# Patient Record
Sex: Female | Born: 1965 | Race: White | Hispanic: No | Marital: Single | Smoking: Current every day smoker
Health system: Southern US, Community
[De-identification: ages and names within clinical notes are randomized; demographics above are authoritative.]

## PROBLEM LIST (undated history)

## (undated) DIAGNOSIS — F419 Anxiety disorder, unspecified: Secondary | ICD-10-CM

## (undated) DIAGNOSIS — Z923 Personal history of irradiation: Secondary | ICD-10-CM

## (undated) DIAGNOSIS — J449 Chronic obstructive pulmonary disease, unspecified: Secondary | ICD-10-CM

## (undated) DIAGNOSIS — R1310 Dysphagia, unspecified: Secondary | ICD-10-CM

## (undated) DIAGNOSIS — Z973 Presence of spectacles and contact lenses: Secondary | ICD-10-CM

## (undated) DIAGNOSIS — C801 Malignant (primary) neoplasm, unspecified: Secondary | ICD-10-CM

## (undated) DIAGNOSIS — J069 Acute upper respiratory infection, unspecified: Secondary | ICD-10-CM

## (undated) DIAGNOSIS — K219 Gastro-esophageal reflux disease without esophagitis: Secondary | ICD-10-CM

## (undated) DIAGNOSIS — Z87442 Personal history of urinary calculi: Secondary | ICD-10-CM

## (undated) DIAGNOSIS — Z1211 Encounter for screening for malignant neoplasm of colon: Secondary | ICD-10-CM

## (undated) DIAGNOSIS — M1990 Unspecified osteoarthritis, unspecified site: Secondary | ICD-10-CM

## (undated) DIAGNOSIS — T7840XA Allergy, unspecified, initial encounter: Secondary | ICD-10-CM

## (undated) DIAGNOSIS — L409 Psoriasis, unspecified: Secondary | ICD-10-CM

## (undated) DIAGNOSIS — C50919 Malignant neoplasm of unspecified site of unspecified female breast: Secondary | ICD-10-CM

## (undated) DIAGNOSIS — J45909 Unspecified asthma, uncomplicated: Secondary | ICD-10-CM

## (undated) HISTORY — PX: BREAST BIOPSY: SHX20

## (undated) HISTORY — DX: Encounter for screening for malignant neoplasm of colon: Z12.11

## (undated) HISTORY — DX: Unspecified asthma, uncomplicated: J45.909

## (undated) HISTORY — DX: Allergy, unspecified, initial encounter: T78.40XA

## (undated) HISTORY — DX: Acute upper respiratory infection, unspecified: J06.9

## (undated) HISTORY — PX: URETHRA SURGERY: SHX824

## (undated) HISTORY — DX: Gastro-esophageal reflux disease without esophagitis: K21.9

## (undated) HISTORY — DX: Psoriasis, unspecified: L40.9

## (undated) HISTORY — DX: Dysphagia, unspecified: R13.10

---

## 2000-07-12 ENCOUNTER — Other Ambulatory Visit: Admission: RE | Admit: 2000-07-12 | Discharge: 2000-07-12 | Payer: Self-pay | Admitting: Obstetrics & Gynecology

## 2002-09-12 ENCOUNTER — Other Ambulatory Visit: Admission: RE | Admit: 2002-09-12 | Discharge: 2002-09-12 | Payer: Self-pay | Admitting: Obstetrics & Gynecology

## 2003-01-07 ENCOUNTER — Other Ambulatory Visit: Admission: RE | Admit: 2003-01-07 | Discharge: 2003-01-07 | Payer: Self-pay | Admitting: Obstetrics & Gynecology

## 2003-07-01 ENCOUNTER — Other Ambulatory Visit: Admission: RE | Admit: 2003-07-01 | Discharge: 2003-07-01 | Payer: Self-pay | Admitting: Obstetrics & Gynecology

## 2003-09-30 ENCOUNTER — Other Ambulatory Visit: Admission: RE | Admit: 2003-09-30 | Discharge: 2003-09-30 | Payer: Self-pay | Admitting: Obstetrics & Gynecology

## 2004-03-23 ENCOUNTER — Other Ambulatory Visit: Admission: RE | Admit: 2004-03-23 | Discharge: 2004-03-23 | Payer: Self-pay | Admitting: Obstetrics & Gynecology

## 2004-12-21 ENCOUNTER — Ambulatory Visit: Payer: Self-pay | Admitting: Internal Medicine

## 2004-12-22 ENCOUNTER — Ambulatory Visit: Payer: Self-pay | Admitting: Internal Medicine

## 2004-12-23 ENCOUNTER — Ambulatory Visit: Payer: Self-pay | Admitting: Internal Medicine

## 2004-12-26 ENCOUNTER — Ambulatory Visit: Payer: Self-pay | Admitting: Internal Medicine

## 2005-02-15 ENCOUNTER — Other Ambulatory Visit: Admission: RE | Admit: 2005-02-15 | Discharge: 2005-02-15 | Payer: Self-pay | Admitting: Obstetrics & Gynecology

## 2005-03-24 ENCOUNTER — Ambulatory Visit: Payer: Self-pay | Admitting: Gastroenterology

## 2006-03-27 ENCOUNTER — Encounter: Admission: RE | Admit: 2006-03-27 | Discharge: 2006-03-27 | Payer: Self-pay | Admitting: Obstetrics & Gynecology

## 2007-04-17 ENCOUNTER — Encounter: Admission: RE | Admit: 2007-04-17 | Discharge: 2007-04-17 | Payer: Self-pay | Admitting: Obstetrics & Gynecology

## 2007-11-23 ENCOUNTER — Emergency Department: Payer: Self-pay | Admitting: Emergency Medicine

## 2008-05-06 ENCOUNTER — Ambulatory Visit: Payer: Self-pay | Admitting: Family Medicine

## 2012-10-16 ENCOUNTER — Other Ambulatory Visit: Payer: Self-pay | Admitting: Obstetrics & Gynecology

## 2012-10-16 ENCOUNTER — Ambulatory Visit
Admission: RE | Admit: 2012-10-16 | Discharge: 2012-10-16 | Disposition: A | Discharge status: Home / Self Care | Payer: Managed Care, Other (non HMO) | Source: Ambulatory Visit | Attending: Obstetrics & Gynecology | Admitting: Obstetrics & Gynecology

## 2012-10-16 DIAGNOSIS — R042 Hemoptysis: Secondary | ICD-10-CM

## 2013-12-30 HISTORY — PX: LAPAROSCOPIC ASSISTED VAGINAL HYSTERECTOMY: SHX5398

## 2013-12-30 HISTORY — PX: BILATERAL SALPINGOOPHORECTOMY: SHX1223

## 2014-11-11 ENCOUNTER — Other Ambulatory Visit: Payer: Self-pay | Admitting: Obstetrics & Gynecology

## 2014-11-11 DIAGNOSIS — N6002 Solitary cyst of left breast: Secondary | ICD-10-CM

## 2014-11-13 ENCOUNTER — Ambulatory Visit
Admission: RE | Admit: 2014-11-13 | Discharge: 2014-11-13 | Disposition: A | Discharge status: Home / Self Care | Payer: Managed Care, Other (non HMO) | Source: Ambulatory Visit | Attending: Obstetrics & Gynecology | Admitting: Obstetrics & Gynecology

## 2014-11-13 DIAGNOSIS — N6002 Solitary cyst of left breast: Secondary | ICD-10-CM

## 2016-06-29 ENCOUNTER — Other Ambulatory Visit: Payer: Self-pay

## 2016-06-29 ENCOUNTER — Encounter: Payer: Self-pay | Admitting: Gastroenterology

## 2016-06-29 ENCOUNTER — Ambulatory Visit (INDEPENDENT_AMBULATORY_CARE_PROVIDER_SITE_OTHER): Payer: Managed Care, Other (non HMO) | Admitting: Gastroenterology

## 2016-06-29 VITALS — BP 131/85 | HR 86 | Temp 98.6°F | Ht 61.0 in | Wt 156.0 lb

## 2016-06-29 DIAGNOSIS — K219 Gastro-esophageal reflux disease without esophagitis: Secondary | ICD-10-CM | POA: Diagnosis not present

## 2016-06-29 DIAGNOSIS — R1310 Dysphagia, unspecified: Secondary | ICD-10-CM

## 2016-06-29 NOTE — Progress Notes (Signed)
Gastroenterology Consultation  Referring Provider:     No ref. provider found Primary Care Physician:  No primary care provider on file. Primary Gastroenterologist:  Dr. Allen Norris     Reason for Consultation:     Dysphagia        HPI:   Denise Reyes is a 50 y.o. y/o female referred for consultation & management of Dysphagia by Dr. No primary care provider on file..  This patient comes in today with a history of dysphagia. She also reports that she has chronic heartburn. The patient recently started on over-the-counter Prevacid and states it has not started working it. The patient states that her symptoms are mostly when she lays down at night but approximate 1 month ago she had a bowl of soup that she felt like her stomach was on fire. The patient also reports that when she eats in the morning she feels like food has a hard time going down and sometimes feels like is getting stuck. The patient also reports that she has never had a colonoscopy in the past. There is no report of any family history of colon cancer colon polyps. There is no report of any unexplained weight loss and the patient states that she has actually gained weight recently. He thinks the weight gain may be contributing to her symptoms.  Past Medical History:  Diagnosis Date  . Allergy   . Asthma   . GERD (gastroesophageal reflux disease)     History reviewed. No pertinent surgical history.  Prior to Admission medications   Medication Sig Start Date End Date Taking? Authorizing Provider  cetirizine (ZYRTEC) 10 MG tablet TK 1 T PO D FOR ALLERGIES 05/18/16  Yes Historical Provider, MD  lansoprazole (PREVACID) 30 MG capsule Take 30 mg by mouth daily at 12 noon.   Yes Historical Provider, MD  PREMARIN 1.25 MG tablet  05/31/16  Yes Historical Provider, MD    Family History  Problem Relation Age of Onset  . Hypertension Mother   . Hypertension Father   . Heart disease Father   . Heart disease Paternal Grandfather       Social History  Substance Use Topics  . Smoking status: Never Smoker  . Smokeless tobacco: Never Used  . Alcohol use No    Allergies as of 06/29/2016  . (Not on File)    Review of Systems:    All systems reviewed and negative except where noted in HPI.   Physical Exam:  BP 131/85   Pulse 86   Temp 98.6 F (37 C) (Oral)   Ht 5\' 1"  (1.549 m)   Wt 156 lb (70.8 kg)   BMI 29.48 kg/m  No LMP recorded. Patient is postmenopausal. Psych:  Alert and cooperative. Normal mood and affect. General:   Alert,  Well-developed, well-nourished, pleasant and cooperative in NAD Head:  Normocephalic and atraumatic. Eyes:  Sclera clear, no icterus.   Conjunctiva pink. Ears:  Normal auditory acuity. Nose:  No deformity, discharge, or lesions. Mouth:  No deformity or lesions,oropharynx pink & moist. Neck:  Supple; no masses or thyromegaly. Lungs:  Respirations even and unlabored.  Clear throughout to auscultation.   No wheezes, crackles, or rhonchi. No acute distress. Heart:  Regular rate and rhythm; no murmurs, clicks, rubs, or gallops. Abdomen:  Normal bowel sounds.  No bruits.  Soft, non-tender and non-distended without masses, hepatosplenomegaly or hernias noted.  No guarding or rebound tenderness.  Negative Carnett sign.   Rectal:  Deferred.  Msk:  Symmetrical without gross deformities.  Good, equal movement & strength bilaterally. Pulses:  Normal pulses noted. Extremities:  No clubbing or edema.  No cyanosis. Neurologic:  Alert and oriented x3;  grossly normal neurologically. Skin:  Intact without significant lesions or rashes.  No jaundice. Lymph Nodes:  No significant cervical adenopathy. Psych:  Alert and cooperative. Normal mood and affect.  Imaging Studies: No results found.  Assessment and Plan:   Denise Reyes is a 50 y.o. y/o female who comes in today with a history of dysphagia. She also has GERD-like symptoms that are worse when she lays down at night. The patient will be  switched to a trial of Dexilant. The patient will also be set up for an EGD and colonoscopy. The colonoscopy will be done for screening purposes in her EGD will be done because of her dysphagia and chronic reflux. I have discussed risks & benefits which include, but are not limited to, bleeding, infection, perforation & drug reaction.  The patient agrees with this plan & written consent will be obtained.       Lucilla Lame, MD. Marval Regal   Note: This dictation was prepared with Dragon dictation along with smaller phrase technology. Any transcriptional errors that result from this process are unintentional.

## 2016-07-03 ENCOUNTER — Encounter: Payer: Self-pay | Admitting: *Deleted

## 2016-07-05 ENCOUNTER — Other Ambulatory Visit: Payer: Self-pay

## 2016-07-05 MED ORDER — PEG 3350-KCL-NABCB-NACL-NASULF 236 G PO SOLR: ORAL | 0 refills | Status: DC

## 2016-07-06 NOTE — Discharge Instructions (Signed)

## 2016-07-07 ENCOUNTER — Ambulatory Visit
Admission: RE | Admit: 2016-07-07 | Discharge: 2016-07-07 | Disposition: A | Discharge status: Home / Self Care | Payer: Managed Care, Other (non HMO) | Source: Ambulatory Visit | Attending: Gastroenterology | Admitting: Gastroenterology

## 2016-07-07 ENCOUNTER — Ambulatory Visit: Payer: Managed Care, Other (non HMO) | Admitting: Anesthesiology

## 2016-07-07 ENCOUNTER — Encounter
Admission: RE | Disposition: A | Discharge status: Home / Self Care | Payer: Self-pay | Source: Ambulatory Visit | Attending: Gastroenterology

## 2016-07-07 DIAGNOSIS — Z79899 Other long term (current) drug therapy: Secondary | ICD-10-CM | POA: Insufficient documentation

## 2016-07-07 DIAGNOSIS — M19031 Primary osteoarthritis, right wrist: Secondary | ICD-10-CM | POA: Diagnosis not present

## 2016-07-07 DIAGNOSIS — M19032 Primary osteoarthritis, left wrist: Secondary | ICD-10-CM | POA: Diagnosis not present

## 2016-07-07 DIAGNOSIS — K219 Gastro-esophageal reflux disease without esophagitis: Secondary | ICD-10-CM | POA: Insufficient documentation

## 2016-07-07 DIAGNOSIS — Z90710 Acquired absence of both cervix and uterus: Secondary | ICD-10-CM | POA: Insufficient documentation

## 2016-07-07 DIAGNOSIS — Z1211 Encounter for screening for malignant neoplasm of colon: Secondary | ICD-10-CM | POA: Insufficient documentation

## 2016-07-07 DIAGNOSIS — K641 Second degree hemorrhoids: Secondary | ICD-10-CM | POA: Diagnosis not present

## 2016-07-07 DIAGNOSIS — F17210 Nicotine dependence, cigarettes, uncomplicated: Secondary | ICD-10-CM | POA: Insufficient documentation

## 2016-07-07 DIAGNOSIS — J45909 Unspecified asthma, uncomplicated: Secondary | ICD-10-CM | POA: Diagnosis not present

## 2016-07-07 DIAGNOSIS — M170 Bilateral primary osteoarthritis of knee: Secondary | ICD-10-CM | POA: Diagnosis not present

## 2016-07-07 DIAGNOSIS — R1310 Dysphagia, unspecified: Secondary | ICD-10-CM | POA: Insufficient documentation

## 2016-07-07 HISTORY — DX: Unspecified osteoarthritis, unspecified site: M19.90

## 2016-07-07 HISTORY — PX: ESOPHAGOGASTRODUODENOSCOPY (EGD) WITH PROPOFOL: SHX5813

## 2016-07-07 HISTORY — PX: COLONOSCOPY WITH PROPOFOL: SHX5780

## 2016-07-07 HISTORY — DX: Presence of spectacles and contact lenses: Z97.3

## 2016-07-07 SURGERY — COLONOSCOPY WITH PROPOFOL
Anesthesia: Monitor Anesthesia Care | Wound class: Contaminated

## 2016-07-07 MED ORDER — ACETAMINOPHEN 160 MG/5ML PO SOLN: 325.0000 mg | ORAL | Status: DC | PRN

## 2016-07-07 MED ORDER — STERILE WATER FOR IRRIGATION IR SOLN
Status: DC | PRN
  Administered 2016-07-07: 10:00:00

## 2016-07-07 MED ORDER — ACETAMINOPHEN 325 MG PO TABS: 325.0000 mg | ORAL_TABLET | ORAL | Status: DC | PRN

## 2016-07-07 MED ORDER — LACTATED RINGERS IV SOLN: INTRAVENOUS | Status: DC

## 2016-07-07 MED ORDER — PROPOFOL 10 MG/ML IV BOLUS
INTRAVENOUS | Status: DC | PRN
  Administered 2016-07-07 (×6): 50 mg via INTRAVENOUS
  Administered 2016-07-07: 100 mg via INTRAVENOUS

## 2016-07-07 MED ORDER — GLYCOPYRROLATE 0.2 MG/ML IJ SOLN
INTRAMUSCULAR | Status: DC | PRN
Start: 2016-07-07 — End: 2016-07-07
  Administered 2016-07-07: 0.2 mg via INTRAVENOUS

## 2016-07-07 MED ORDER — LIDOCAINE HCL (CARDIAC) 20 MG/ML IV SOLN
INTRAVENOUS | Status: DC | PRN
  Administered 2016-07-07: 50 mg via INTRAVENOUS

## 2016-07-07 MED ORDER — LACTATED RINGERS IV SOLN
INTRAVENOUS | Status: DC | PRN
  Administered 2016-07-07: 09:00:00 via INTRAVENOUS

## 2016-07-07 SURGICAL SUPPLY — 35 items

## 2016-07-07 NOTE — Op Note (Signed)
[facility] Gastroenterology Patient Name: Denise Reyes Procedure Date: 07/07/2016 10:07 AM MRN: [id number] Account #: [id number] Date of Birth: 1966-03-12 Admit Type: Outpatient Age: 50 Room: [facility] OR ROOM 01 Gender: Female Note Status: Finalized Procedure:            Colonoscopy Indications:          Screening for colorectal malignant neoplasm Providers:            Lucilla Lame MD, MD Medicines:            Propofol per Anesthesia Complications:        No immediate complications. Procedure:            Pre-Anesthesia Assessment:                       - Prior to the procedure, a History and Physical was                        performed, and patient medications and allergies were                        reviewed. The patient's tolerance of previous                        anesthesia was also reviewed. The risks and benefits of                        the procedure and the sedation options and risks were                        discussed with the patient. All questions were                        answered, and informed consent was obtained. Prior                        Anticoagulants: The patient has taken no previous                        anticoagulant or antiplatelet agents. ASA Grade                        Assessment: II - A patient with mild systemic disease.                        After reviewing the risks and benefits, the patient was                        deemed in satisfactory condition to undergo the                        procedure.                       After obtaining informed consent, the colonoscope was                        passed under direct vision. Throughout the procedure,                        the patient's blood pressure,  pulse, and oxygen                        saturations were monitored continuously. The was                        introduced through the anus and advanced to the the                        cecum, identified by appendiceal orifice  and ileocecal                        valve. The colonoscopy was performed without                        difficulty. The patient tolerated the procedure well.                        The quality of the bowel preparation was excellent. Findings:      The perianal and digital rectal examinations were normal.      Non-bleeding internal hemorrhoids were found during retroflexion. The       hemorrhoids were Grade II (internal hemorrhoids that prolapse but reduce       spontaneously). Impression:           - Non-bleeding internal hemorrhoids.                       - No specimens collected. Recommendation:       - Discharge patient to home.                       - Resume previous diet.                       - Continue present medications.                       - Repeat colonoscopy in 10 years for screening unless                        any change in family history or lower GI problems. Procedure Code(s):    --- Professional ---                       [phone], Colonoscopy, flexible; diagnostic, including                        collection of specimen(s) by brushing or washing, when                        performed (separate procedure) Diagnosis Code(s):    --- Professional ---                       Z12.11, Encounter for screening for malignant neoplasm                        of colon CPT copyright 2016 American Medical Association. All rights reserved. The codes documented in this report are preliminary and upon coder review may  be revised to meet current compliance requirements. Lucilla Lame MD, MD 07/07/2016 10:34:03 AM This report has been signed electronically.  Number of Addenda: 0 Note Initiated On: 07/07/2016 10:07 AM Scope Withdrawal Time: 0 hours 7 minutes 5 seconds  Total Procedure Duration: 0 hours 10 minutes 15 seconds       [facility]

## 2016-07-07 NOTE — Op Note (Signed)
[facility] Gastroenterology Patient Name: Denise Reyes Procedure Date: 07/07/2016 10:07 AM MRN: [id number] Account #: [id number] Date of Birth: May 07, 1966 Admit Type: Outpatient Age: 51 Room: [facility] OR ROOM 01 Gender: Female Note Status: Finalized Procedure:            Upper GI endoscopy Indications:          Dysphagia Providers:            Lucilla Lame MD, MD Medicines:            Propofol per Anesthesia Complications:        No immediate complications. Procedure:            Pre-Anesthesia Assessment:                       - Prior to the procedure, a History and Physical was                        performed, and patient medications and allergies were                        reviewed. The patient's tolerance of previous                        anesthesia was also reviewed. The risks and benefits of                        the procedure and the sedation options and risks were                        discussed with the patient. All questions were                        answered, and informed consent was obtained. Prior                        Anticoagulants: The patient has taken no previous                        anticoagulant or antiplatelet agents. ASA Grade                        Assessment: II - A patient with mild systemic disease.                        After reviewing the risks and benefits, the patient was                        deemed in satisfactory condition to undergo the                        procedure.                       After obtaining informed consent, the endoscope was                        passed under direct vision. Throughout the procedure,                        the patient's blood pressure, pulse, and  oxygen                        saturations were monitored continuously. The was                        introduced through the mouth, and advanced to the                        second part of duodenum. The upper GI endoscopy was    accomplished without difficulty. The patient tolerated                        the procedure well. Findings:      The examined esophagus was normal. Two biopsies were obtained in the       middle third of the esophagus with cold forceps for histology.      The stomach was normal.      The examined duodenum was normal. Impression:           - Normal esophagus.                       - Normal stomach.                       - Normal examined duodenum.                       - Two biopsies were obtained in the middle third of the                        esophagus. Recommendation:       - Discharge patient to home.                       - Resume previous diet.                       - Continue present medications.                       - Await pathology results.                       - Perform a colonoscopy. Procedure Code(s):    --- Professional ---                       [phone], Esophagogastroduodenoscopy, flexible, transoral;                        with biopsy, single or multiple Diagnosis Code(s):    --- Professional ---                       R13.10, Dysphagia, unspecified CPT copyright 2016 American Medical Association. All rights reserved. The codes documented in this report are preliminary and upon coder review may  be revised to meet current compliance requirements. Lucilla Lame MD, MD 07/07/2016 10:19:34 AM This report has been signed electronically. Number of Addenda: 0 Note Initiated On: 07/07/2016 10:07 AM Total Procedure Duration: 0 hours 2 minutes 39 seconds       [facility]

## 2016-07-07 NOTE — H&P (Signed)
  Lucilla Lame, MD [facility] [address] [place], [place] [address] Phone: [phone] Fax : [phone]  Primary Care Physician:  No primary care provider on file. Primary Gastroenterologist:  Dr. Allen Norris  Pre-Procedure History & Physical: HPI:  Denise Reyes is a 50 y.o. female is here for an endoscopy and colonoscopy.   Past Medical History:  Diagnosis Date  . Allergy   . Arthritis    knees, wrists  . Asthma   . GERD (gastroesophageal reflux disease)   . Wears contact lenses     Past Surgical History:  Procedure Laterality Date  . ABDOMINAL HYSTERECTOMY      Prior to Admission medications   Medication Sig Start Date End Date Taking? Authorizing Provider  cetirizine (ZYRTEC) 10 MG tablet TK 1 T PO D FOR ALLERGIES 05/18/16  Yes Historical Provider, MD  Dexlansoprazole (DEXILANT PO) Take by mouth daily.   Yes Historical Provider, MD  PREMARIN 1.25 MG tablet  05/31/16  Yes Historical Provider, MD  lansoprazole (PREVACID) 30 MG capsule Take 30 mg by mouth daily at 12 noon.    Historical Provider, MD  polyethylene glycol (GOLYTELY) 236 g solution Drink one 8 oz glass every 20 mins until stools are clear 07/05/16   Lucilla Lame, MD    Allergies as of 06/29/2016  . (Not on File)    Family History  Problem Relation Age of Onset  . Hypertension Mother   . Hypertension Father   . Heart disease Father   . Heart disease Paternal Grandfather     Social History   Social History  . Marital status: Married    Spouse name: N/A  . Number of children: N/A  . Years of education: N/A   Occupational History  . Not on file.   Social History Main Topics  . Smoking status: Current Every Day Smoker    Packs/day: 1.00    Years: 35.00    Types: Cigarettes  . Smokeless tobacco: Never Used  . Alcohol use 1.2 oz/week    2 Cans of beer per week  . Drug use: No  . Sexual activity: Not on file   Other Topics Concern  . Not on file   Social History Narrative  . No narrative  on file    Review of Systems: See HPI, otherwise negative ROS  Physical Exam: BP (!) 120/96   Pulse 96   Temp 97.6 F (36.4 C) (Temporal)   Ht 4\' 11"  (1.499 m)   Wt 152 lb (68.9 kg)   SpO2 97%   BMI 30.70 kg/m  General:   Alert,  pleasant and cooperative in NAD Head:  Normocephalic and atraumatic. Neck:  Supple; no masses or thyromegaly. Lungs:  Clear throughout to auscultation.    Heart:  Regular rate and rhythm. Abdomen:  Soft, nontender and nondistended. Normal bowel sounds, without guarding, and without rebound.   Neurologic:  Alert and  oriented x4;  grossly normal neurologically.  Impression/Plan: Denise Reyes is here for an endoscopy and colonoscopy to be performed for dysphagia screening  Risks, benefits, limitations, and alternatives regarding  endoscopy and colonoscopy have been reviewed with the patient.  Questions have been answered.  All parties agreeable.   Lucilla Lame, MD  07/07/2016, 9:42 AM

## 2016-07-07 NOTE — Anesthesia Postprocedure Evaluation (Signed)
Anesthesia Post Note  Patient: Denise Reyes  Procedure(s) Performed: Procedure(s) (LRB): COLONOSCOPY WITH PROPOFOL (N/A) ESOPHAGOGASTRODUODENOSCOPY (EGD) WITH PROPOFOL (N/A)  Patient location during evaluation: PACU Anesthesia Type: MAC Level of consciousness: awake and alert and oriented Pain management: satisfactory to patient Vital Signs Assessment: post-procedure vital signs reviewed and stable Respiratory status: spontaneous breathing, nonlabored ventilation and respiratory function stable Cardiovascular status: blood pressure returned to baseline and stable Postop Assessment: Adequate PO intake and No signs of nausea or vomiting Anesthetic complications: no    Raliegh Ip

## 2016-07-07 NOTE — Anesthesia Procedure Notes (Signed)
Procedure Name: MAC Performed by: Rorie Delmore Pre-anesthesia Checklist: Patient identified, Emergency Drugs available, Suction available, Timeout performed and Patient being monitored Patient Re-evaluated:Patient Re-evaluated prior to inductionOxygen Delivery Method: Nasal cannula Placement Confirmation: positive ETCO2     

## 2016-07-07 NOTE — Anesthesia Preprocedure Evaluation (Signed)
Anesthesia Evaluation  Patient identified by MRN, date of birth, ID band Patient awake    Reviewed: Allergy & Precautions, H&P , NPO status , Patient's Chart, lab work & pertinent test results  Airway Mallampati: II  TM Distance: >3 FB Neck ROM: full    Dental no notable dental hx.    Pulmonary asthma , Current Smoker,    Pulmonary exam normal        Cardiovascular Normal cardiovascular exam     Neuro/Psych    GI/Hepatic GERD  ,  Endo/Other    Renal/GU      Musculoskeletal   Abdominal   Peds  Hematology   Anesthesia Other Findings   Reproductive/Obstetrics                             Anesthesia Physical Anesthesia Plan  ASA: II  Anesthesia Plan: MAC   Post-op Pain Management:    Induction:   Airway Management Planned:   Additional Equipment:   Intra-op Plan:   Post-operative Plan:   Informed Consent: I have reviewed the patients History and Physical, chart, labs and discussed the procedure including the risks, benefits and alternatives for the proposed anesthesia with the patient or authorized representative who has indicated his/her understanding and acceptance.     Plan Discussed with:   Anesthesia Plan Comments:         Anesthesia Quick Evaluation

## 2016-07-07 NOTE — Transfer of Care (Signed)
Immediate Anesthesia Transfer of Care Note  Patient: Denise Reyes  Procedure(s) Performed: Procedure(s): COLONOSCOPY WITH PROPOFOL (N/A) ESOPHAGOGASTRODUODENOSCOPY (EGD) WITH PROPOFOL (N/A)  Patient Location: PACU  Anesthesia Type: MAC  Level of Consciousness: awake, alert  and patient cooperative  Airway and Oxygen Therapy: Patient Spontanous Breathing and Patient connected to supplemental oxygen  Post-op Assessment: Post-op Vital signs reviewed, Patient's Cardiovascular Status Stable, Respiratory Function Stable, Patent Airway and No signs of Nausea or vomiting  Post-op Vital Signs: Reviewed and stable  Complications: No apparent anesthesia complications

## 2016-07-10 ENCOUNTER — Encounter: Payer: Self-pay | Admitting: Gastroenterology

## 2016-07-13 ENCOUNTER — Telehealth: Payer: Self-pay

## 2016-07-13 ENCOUNTER — Other Ambulatory Visit: Payer: Self-pay

## 2016-07-13 MED ORDER — DEXLANSOPRAZOLE 60 MG PO CPDR: 60.0000 mg | DELAYED_RELEASE_CAPSULE | Freq: Every day | ORAL | 6 refills | Status: DC

## 2016-07-13 NOTE — Telephone Encounter (Signed)
-----   Message from Lucilla Lame, MD sent at 07/11/2016  7:41 AM EST ----- Let the patient know that the pathology of the esophagus showed some reactive tissue which may be from reflux but no sign of any cause for her dysphagia. If she continues to have dysphagia please let me now.

## 2016-07-13 NOTE — Telephone Encounter (Signed)
Pt has been notified of EGD results. Pt has requested a rx for Dexilant as she was given samples at her last ov and it is working well. Advised her if she starts feeling symptoms of dysphagia to give me a call per Dr. Dorothey Baseman recommendations.

## 2017-01-25 ENCOUNTER — Ambulatory Visit
Admission: RE | Admit: 2017-01-25 | Discharge: 2017-01-25 | Disposition: A | Discharge status: Home / Self Care | Payer: Managed Care, Other (non HMO) | Source: Ambulatory Visit | Attending: Family Medicine | Admitting: Family Medicine

## 2017-01-25 ENCOUNTER — Other Ambulatory Visit: Payer: Self-pay | Admitting: Family Medicine

## 2017-01-25 DIAGNOSIS — R05 Cough: Secondary | ICD-10-CM

## 2017-01-25 DIAGNOSIS — R059 Cough, unspecified: Secondary | ICD-10-CM

## 2017-07-31 DIAGNOSIS — Z923 Personal history of irradiation: Secondary | ICD-10-CM

## 2017-07-31 HISTORY — DX: Personal history of irradiation: Z92.3

## 2017-12-07 ENCOUNTER — Other Ambulatory Visit: Payer: Self-pay | Admitting: Obstetrics & Gynecology

## 2017-12-07 DIAGNOSIS — N6320 Unspecified lump in the left breast, unspecified quadrant: Secondary | ICD-10-CM

## 2017-12-11 ENCOUNTER — Other Ambulatory Visit: Payer: Self-pay | Admitting: Obstetrics & Gynecology

## 2017-12-11 ENCOUNTER — Ambulatory Visit
Admission: RE | Admit: 2017-12-11 | Discharge: 2017-12-11 | Disposition: A | Discharge status: Home / Self Care | Payer: Managed Care, Other (non HMO) | Source: Ambulatory Visit | Attending: Obstetrics & Gynecology | Admitting: Obstetrics & Gynecology

## 2017-12-11 DIAGNOSIS — N6320 Unspecified lump in the left breast, unspecified quadrant: Secondary | ICD-10-CM

## 2017-12-11 DIAGNOSIS — R921 Mammographic calcification found on diagnostic imaging of breast: Secondary | ICD-10-CM

## 2017-12-26 ENCOUNTER — Ambulatory Visit
Admission: RE | Admit: 2017-12-26 | Discharge: 2017-12-26 | Disposition: A | Discharge status: Home / Self Care | Payer: Managed Care, Other (non HMO) | Source: Ambulatory Visit | Attending: Obstetrics & Gynecology | Admitting: Obstetrics & Gynecology

## 2017-12-26 ENCOUNTER — Other Ambulatory Visit: Payer: Self-pay | Admitting: Obstetrics & Gynecology

## 2017-12-26 DIAGNOSIS — R921 Mammographic calcification found on diagnostic imaging of breast: Secondary | ICD-10-CM

## 2017-12-29 HISTORY — PX: BREAST LUMPECTOMY: SHX2

## 2018-01-09 ENCOUNTER — Encounter: Payer: Self-pay | Admitting: Surgery

## 2018-01-09 ENCOUNTER — Ambulatory Visit (INDEPENDENT_AMBULATORY_CARE_PROVIDER_SITE_OTHER): Payer: Managed Care, Other (non HMO) | Admitting: Surgery

## 2018-01-09 VITALS — BP 119/80 | HR 92 | Temp 97.8°F | Wt 136.0 lb

## 2018-01-09 DIAGNOSIS — D0512 Intraductal carcinoma in situ of left breast: Secondary | ICD-10-CM

## 2018-01-09 NOTE — Patient Instructions (Signed)
We have spoken today about removing a lump in your breast. This will be done on 01/23/2018 by Dr. Burt Knack at [facility].  You will most likely be able to leave the hospital several hours after your surgery. Rarely, a patient needs to stay over night but this is a possibility.  Plan to tenatively be off work for 1-2 weeks following the surgery and may return with approximately 4 more weeks of a lifting restriction, no greater than 15 lbs.    Lumpectomy A lumpectomy is a form of "breast conserving" or "breast preservation" surgery. It may also be referred to as a partial mastectomy. During a lumpectomy, the portion of the breast that contains the cancerous tumor or breast mass (the lump) is removed. Some normal tissue around the lump may also be removed to make sure all of the tumor has been removed.  LET [facility] CARE PROVIDER KNOW ABOUT:  Any allergies you have.  All medicines you are taking, including vitamins, herbs, eye drops, creams, and over-the-counter medicines.  Previous problems you or members of your family have had with the use of anesthetics.  Any blood disorders you have.  Previous surgeries you have had.  Medical conditions you have. RISKS AND COMPLICATIONS Generally, this is a safe procedure. However, problems can occur and include:  Bleeding.  Infection.  Pain.  Temporary swelling.  Change in the shape of the breast, particularly if a large portion is removed. BEFORE THE PROCEDURE  Ask your health care provider about changing or stopping your regular medicines. This is especially important if you are taking diabetes medicines or blood thinners.  Do not eat or drink anything after midnight on the night before the procedure or as directed by your health care provider. Ask your health care provider if you can take a sip of water with any approved medicines.  On the day of surgery, your health care provider will use a mammogram or ultrasound to locate and mark the  tumor in your breast. These markings on your breast will show where the cut (incision) will be made. PROCEDURE   An IV tube will be put into one of your veins.  You may be given medicine to help you relax before the surgery (sedative). You will be given one of the following:  A medicine that numbs the area (local anesthetic).  A medicine that makes you fall asleep (general anesthetic).  Your health care provider will use a kind of electric scalpel that uses heat to minimize bleeding (electrocautery knife).  A curved incision (like a smile or frown) that follows the natural curve of your breast is made, to allow for minimal scarring and better healing.  The tumor will be removed with some of the surrounding tissue. This will be sent to the lab for analysis. Your health care provider may also remove your lymph nodes at this time if needed.  Sometimes, but not always, a rubber tube called a drain will be surgically inserted into your breast area or armpit to collect excess fluid that may accumulate in the space where the tumor was. This drain is connected to a plastic bulb on the outside of your body. This drain creates suction to help remove the fluid.  The incisions will be closed with stitches (sutures).  A bandage may be placed over the incisions. AFTER THE PROCEDURE  You will be taken to the recovery area.  You will be given medicine for pain.  A small rubber drain may be placed in the  breast for 2-3 days to prevent a collection of blood (hematoma) from developing in the breast. You will be given instructions on caring for the drain before you go home.  A pressure bandage (dressing) will be applied for 1-2 days to prevent bleeding. Ask your health care provider how to care for your bandage at home.   This information is not intended to replace advice given to you by your health care provider. Make sure you discuss any questions you have with your health care provider.   Document  Released: 08/28/2006 Document Revised: 08/07/2014 Document Reviewed: 12/20/2012 Elsevier Interactive Patient Education [employer].

## 2018-01-09 NOTE — Progress Notes (Signed)
Denise Reyes is an 52 y.o. female.   Consult requested by Dr. 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  On mammography it measured 7 mm of pleomorphic calcifications.  Patient states that her primary care physician found this on palpation but the patient never felt it herself.  She states that she does occasional but not regular self exams.  She has no other symptoms.  Her father's mother had breast cancer in her 69s.  Patient smokes tobacco but also takes Premarin.  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  Past Medical History:  Diagnosis Date  . Allergy   . Arthritis    knees, wrists  . Asthma   . GERD (gastroesophageal reflux disease)   . Problems with swallowing and mastication   . Special screening for malignant neoplasms, colon   . Wears contact lenses     Past Surgical History:  Procedure Laterality Date  . ABDOMINAL HYSTERECTOMY    . COLONOSCOPY WITH PROPOFOL N/A 07/07/2016   Procedure: COLONOSCOPY WITH PROPOFOL;  Surgeon: Lucilla Lame, MD;  Location: [facility];  Service: Endoscopy;  Laterality: N/A;  . ESOPHAGOGASTRODUODENOSCOPY (EGD) WITH PROPOFOL N/A 07/07/2016   Procedure: ESOPHAGOGASTRODUODENOSCOPY (EGD) WITH PROPOFOL;  Surgeon: Lucilla Lame, MD;  Location: [facility];  Service: Endoscopy;  Laterality: N/A;    Family History  Problem Relation Age of Onset  . Hypertension Mother   . Hypertension Father   . Heart disease Father   . Heart disease Paternal Grandfather    Social History:  reports that she has been smoking cigarettes.  She has a 35.00 pack-year smoking history. She has never used smokeless tobacco. She reports that she drinks about 1.2 oz of alcohol per week. She reports that she does not use drugs.  Allergies: No Known Allergies   (Not in a hospital admission)   Review of Systems:   Review of Systems  Constitutional: Negative.   HENT: Negative.    Eyes: Negative.   Respiratory: Negative.   Cardiovascular: Negative.   Gastrointestinal: Negative.   Genitourinary: Negative.   Musculoskeletal: Negative.   Skin: Negative.   Neurological: Negative.   Endo/Heme/Allergies: Negative.   Psychiatric/Behavioral: Negative.     Physical Exam:  Physical Exam  Constitutional: She is oriented to person, place, and time. She appears well-developed and well-nourished.  HENT:  Head: Normocephalic and atraumatic.  Eyes: Pupils are equal, round, and reactive to light. Right eye exhibits no discharge. Left eye exhibits no discharge. No scleral icterus.  Neck: Normal range of motion. Neck supple.  Cardiovascular: Normal rate and regular rhythm.  Pulmonary/Chest: Effort normal and breath sounds normal.  Abdominal: Soft. She exhibits no distension.  Musculoskeletal: She exhibits no edema or deformity.  Neurological: She is alert and oriented to person, place, and time.  Skin: Skin is warm. No rash noted. No erythema.  Psychiatric: She has a normal mood and affect. Her behavior is normal.  Vitals reviewed.  Breast exam: No mass in either breast no axillary adenopathy.  In the left 4 o'clock position is an area of induration without erythema and a small wound suggestive of entry point for the core needle biopsy.  No drainage. There were no vitals taken for this visit.    No results found for this or any previous visit (from the past 48 hour(s)). No results found.   Assessment/Plan Mammogram and ultrasound are reviewed. Mammogram and ultrasound size this area  of pleomorphic calcifications at approximately 7 mm with a clinically negative axilla and ultrasound graphically negative axilla.  Patient warrants excision of the left mammographic abnormality consisting of DCIS.  No sentinel node biopsy is warranted in this patient with a low risk lesion and small size on mammography with a clinically negative axilla with ultrasound negative  axilla.  Multiple questions were answered for she and her sister.  Her sister notified us that she is ovarian cancer patient and has undergone bracket testing and other genetic testing all negative.  There is no history of colon cancer in their family. Approximately 45 minutes were spent with the family.   Florene Glen, MD, FACS

## 2018-01-10 ENCOUNTER — Other Ambulatory Visit: Payer: Self-pay | Admitting: Surgery

## 2018-01-10 DIAGNOSIS — D0512 Intraductal carcinoma in situ of left breast: Secondary | ICD-10-CM

## 2018-01-11 ENCOUNTER — Telehealth: Payer: Self-pay | Admitting: Surgery

## 2018-01-11 NOTE — Telephone Encounter (Signed)
Pt advised of pre op date/time and sx date. Sx: 01/23/18 with Dr Beryle Beams breast bx with NL/partial mastectomy.  Pre op: 01/17/18 between 9-1:00pm--phone interview.   Patient made aware to arrive at [facility] center at 7:45am-the morning of surgery.   Patient understands all directions.

## 2018-01-17 ENCOUNTER — Encounter
Admission: RE | Admit: 2018-01-17 | Discharge: 2018-01-17 | Disposition: A | Discharge status: Home / Self Care | Payer: Managed Care, Other (non HMO) | Source: Ambulatory Visit | Attending: Surgery | Admitting: Surgery

## 2018-01-17 ENCOUNTER — Telehealth: Payer: Self-pay | Admitting: Surgery

## 2018-01-17 ENCOUNTER — Other Ambulatory Visit: Payer: Self-pay

## 2018-01-17 HISTORY — DX: Malignant (primary) neoplasm, unspecified: C80.1

## 2018-01-17 HISTORY — DX: Personal history of urinary calculi: Z87.442

## 2018-01-17 HISTORY — DX: Anxiety disorder, unspecified: F41.9

## 2018-01-17 NOTE — Patient Instructions (Signed)
Your procedure is scheduled on: 01-23-18 [facility] Report to [place] @ 7:45 AM Remember: Instructions that are not followed completely may result in serious medical risk, up to and including death, or upon the discretion of your surgeon and anesthesiologist your surgery may need to be rescheduled.    _x___ 1. Do not eat food after midnight the night before your procedure. NO GUM OR CANDY AFTER MIDNIGHT.  You may drink clear liquids up to 2 hours before you are scheduled to arrive at the hospital for your procedure.  Do not drink clear liquids within 2 hours of your scheduled arrival to the hospital.  Clear liquids include  --Water or Apple juice without pulp  --Clear carbohydrate beverage such as ClearFast or Gatorade  --Black Coffee or Clear Tea (No milk, no creamers, do not add anything to the coffee or Tea   __x__ 2. No Alcohol for 24 hours before or after surgery.   __x__3. No Smoking or e-cigarettes for 24 prior to surgery.  Do not use any chewable tobacco products for at least 6 hour prior to surgery   ____  4. Bring all medications with you on the day of surgery if instructed.    __x__ 5. Notify your doctor if there is any change in your medical condition     (cold, fever, infections).    x___6. On the morning of surgery brush your teeth with toothpaste and water.  You may rinse your mouth with mouth wash if you wish.  Do not swallow any toothpaste or mouthwash.   Do not wear jewelry, make-up, hairpins, clips or nail polish.  Do not wear lotions, powders, or perfumes. You may wear deodorant.  Do not shave 48 hours prior to surgery. Men may shave face and neck.  Do not bring valuables to the hospital.    [facility] is not responsible for any belongings or valuables.               Contacts, dentures or bridgework may not be worn into surgery.  Leave your suitcase in the car. After surgery it may be brought to your room.  For patients admitted to the hospital,  discharge time is determined by your treatment team.  _  Patients discharged the day of surgery will not be allowed to drive home.  You will need someone to drive you home and stay with you the night of your procedure.    Please read over the following fact sheets that you were given:   [facility] Preparing for Surgery  _x___ TAKE THE FOLLOWING MEDICATION THE MORNING OF SURGERY WITH A SMALL SIP OF WATER. These include:  1. ZYRTEC-D  2. YOU MAY TAKE YOUR XANAX (ALPRAZOLAM) IF NEEDED DAY OF SURGERY   3.  4.  5.  6.  ____Fleets enema or Magnesium Citrate as directed.   _x___ Use CHG Soap or sage wipes as directed on instruction sheet   _X___ Use inhalers on the day of surgery and bring to hospital day of surgery-USE YOUR ALBUTEROL INHALER DAY OF SURGERY AND [place]  ____ Stop Metformin and Janumet 2 days prior to surgery.    ____ Take 1/2 of usual insulin dose the night before surgery and none on the morning surgery.   ____ Follow recommendations from Cardiologist, Pulmonologist or PCP regarding stopping Aspirin, Coumadin, Plavix ,Eliquis, Effient, or Pradaxa, and Pletal.  X____Stop Anti-inflammatories such as Advil, Aleve, Ibuprofen, Motrin, Naproxen, Naprosyn, Goodies powders or aspirin products  NOW-OK to take Tylenol    ____ Stop supplements until after surgery.     ____ Bring C-Pap to the hospital.

## 2018-01-17 NOTE — Telephone Encounter (Signed)
Called [employer] back and asked for Denise Reyes, however, I was able to speak to Denise Reyes. He had specific questions about patient's health. Denise Reyes requested to know what was patient's diagnosis, surgery type and date and if she had any restrictions. Denise Reyes had no further questions.

## 2018-01-17 NOTE — Telephone Encounter (Signed)
A lady named Denise Reyes is calling from patient's disability is calling to get some information on the patient's breast cancer. Number she can be reached at is [phone] and the claim number is [id number]. Please call and advise.

## 2018-01-21 ENCOUNTER — Encounter
Admission: RE | Admit: 2018-01-21 | Discharge: 2018-01-21 | Disposition: A | Discharge status: Home / Self Care | Payer: Managed Care, Other (non HMO) | Source: Ambulatory Visit | Attending: Surgery | Admitting: Surgery

## 2018-01-21 DIAGNOSIS — D0512 Intraductal carcinoma in situ of left breast: Secondary | ICD-10-CM | POA: Diagnosis present

## 2018-01-21 DIAGNOSIS — M170 Bilateral primary osteoarthritis of knee: Secondary | ICD-10-CM | POA: Diagnosis not present

## 2018-01-21 DIAGNOSIS — M19031 Primary osteoarthritis, right wrist: Secondary | ICD-10-CM | POA: Diagnosis not present

## 2018-01-21 DIAGNOSIS — J45909 Unspecified asthma, uncomplicated: Secondary | ICD-10-CM | POA: Diagnosis not present

## 2018-01-21 DIAGNOSIS — Z79899 Other long term (current) drug therapy: Secondary | ICD-10-CM | POA: Diagnosis not present

## 2018-01-21 DIAGNOSIS — Z803 Family history of malignant neoplasm of breast: Secondary | ICD-10-CM | POA: Diagnosis not present

## 2018-01-21 DIAGNOSIS — K219 Gastro-esophageal reflux disease without esophagitis: Secondary | ICD-10-CM | POA: Diagnosis not present

## 2018-01-21 DIAGNOSIS — M19032 Primary osteoarthritis, left wrist: Secondary | ICD-10-CM | POA: Diagnosis not present

## 2018-01-21 DIAGNOSIS — Z8249 Family history of ischemic heart disease and other diseases of the circulatory system: Secondary | ICD-10-CM | POA: Diagnosis not present

## 2018-01-21 DIAGNOSIS — F419 Anxiety disorder, unspecified: Secondary | ICD-10-CM | POA: Diagnosis not present

## 2018-01-21 DIAGNOSIS — F17210 Nicotine dependence, cigarettes, uncomplicated: Secondary | ICD-10-CM | POA: Diagnosis not present

## 2018-01-21 LAB — CBC WITH DIFFERENTIAL/PLATELET
BASOS ABS: 0 10*3/uL (ref 0–0.1)
Basophils Relative: 0 %
EOS PCT: 1 %
Eosinophils Absolute: 0.1 10*3/uL (ref 0–0.7)
HEMATOCRIT: 41.3 % (ref 35.0–47.0)
Hemoglobin: 14.4 g/dL (ref 12.0–16.0)
LYMPHS ABS: 4.8 10*3/uL — AB (ref 1.0–3.6)
Lymphocytes Relative: 46 %
MCH: 32.8 pg (ref 26.0–34.0)
MCHC: 34.8 g/dL (ref 32.0–36.0)
MCV: 94.2 fL (ref 80.0–100.0)
MONO ABS: 0.7 10*3/uL (ref 0.2–0.9)
MONOS PCT: 7 %
NEUTROS ABS: 4.8 10*3/uL (ref 1.4–6.5)
Neutrophils Relative %: 46 %
PLATELETS: 372 10*3/uL (ref 150–440)
RBC: 4.38 MIL/uL (ref 3.80–5.20)
RDW: 12.9 % (ref 11.5–14.5)
WBC: 10.4 10*3/uL (ref 3.6–11.0)

## 2018-01-21 LAB — BASIC METABOLIC PANEL
ANION GAP: 9 (ref 5–15)
BUN: 13 mg/dL (ref 6–20)
CO2: 25 mmol/L (ref 22–32)
Calcium: 8.8 mg/dL — ABNORMAL LOW (ref 8.9–10.3)
Chloride: 102 mmol/L (ref 101–111)
Creatinine, Ser: 0.64 mg/dL (ref 0.44–1.00)
GFR calc Af Amer: >60 mL/min (ref >60)
GFR calc non Af Amer: >60 mL/min (ref >60)
Glucose, Bld: 83 mg/dL (ref 65–99)
Potassium: 3.4 mmol/L — ABNORMAL LOW (ref 3.5–5.1)
Sodium: 136 mmol/L (ref 135–145)

## 2018-01-22 ENCOUNTER — Inpatient Hospital Stay: Admission: RE | Admit: 2018-01-22 | Payer: Managed Care, Other (non HMO) | Source: Ambulatory Visit

## 2018-01-23 ENCOUNTER — Ambulatory Visit
Admission: RE | Admit: 2018-01-23 | Discharge: 2018-01-23 | Disposition: A | Discharge status: Home / Self Care | Payer: Managed Care, Other (non HMO) | Source: Ambulatory Visit | Attending: Surgery | Admitting: Surgery

## 2018-01-23 ENCOUNTER — Encounter
Admission: RE | Disposition: A | Discharge status: Home / Self Care | Payer: Self-pay | Source: Ambulatory Visit | Attending: Surgery

## 2018-01-23 ENCOUNTER — Ambulatory Visit: Payer: Managed Care, Other (non HMO) | Admitting: Registered Nurse

## 2018-01-23 DIAGNOSIS — D0512 Intraductal carcinoma in situ of left breast: Secondary | ICD-10-CM

## 2018-01-23 DIAGNOSIS — M19031 Primary osteoarthritis, right wrist: Secondary | ICD-10-CM | POA: Insufficient documentation

## 2018-01-23 DIAGNOSIS — Z8249 Family history of ischemic heart disease and other diseases of the circulatory system: Secondary | ICD-10-CM | POA: Insufficient documentation

## 2018-01-23 DIAGNOSIS — M170 Bilateral primary osteoarthritis of knee: Secondary | ICD-10-CM | POA: Insufficient documentation

## 2018-01-23 DIAGNOSIS — Z79899 Other long term (current) drug therapy: Secondary | ICD-10-CM | POA: Insufficient documentation

## 2018-01-23 DIAGNOSIS — Z803 Family history of malignant neoplasm of breast: Secondary | ICD-10-CM | POA: Insufficient documentation

## 2018-01-23 DIAGNOSIS — M19032 Primary osteoarthritis, left wrist: Secondary | ICD-10-CM | POA: Insufficient documentation

## 2018-01-23 DIAGNOSIS — F419 Anxiety disorder, unspecified: Secondary | ICD-10-CM | POA: Insufficient documentation

## 2018-01-23 DIAGNOSIS — K219 Gastro-esophageal reflux disease without esophagitis: Secondary | ICD-10-CM | POA: Insufficient documentation

## 2018-01-23 DIAGNOSIS — F17210 Nicotine dependence, cigarettes, uncomplicated: Secondary | ICD-10-CM | POA: Insufficient documentation

## 2018-01-23 DIAGNOSIS — J45909 Unspecified asthma, uncomplicated: Secondary | ICD-10-CM | POA: Insufficient documentation

## 2018-01-23 HISTORY — PX: BREAST BIOPSY: SHX20

## 2018-01-23 HISTORY — PX: BREAST EXCISIONAL BIOPSY: SUR124

## 2018-01-23 SURGERY — BREAST BIOPSY WITH NEEDLE LOCALIZATION
Anesthesia: General | Site: Breast | Laterality: Left | Wound class: Clean

## 2018-01-23 MED ORDER — FAMOTIDINE 20 MG PO TABS
ORAL_TABLET | ORAL | Status: AC
  Administered 2018-01-23: 20 mg via ORAL
  Filled 2018-01-23: qty 1

## 2018-01-23 MED ORDER — LACTATED RINGERS IV SOLN
INTRAVENOUS | Status: DC
  Administered 2018-01-23: 09:00:00 via INTRAVENOUS

## 2018-01-23 MED ORDER — FAMOTIDINE 20 MG PO TABS
20.0000 mg | ORAL_TABLET | Freq: Once | ORAL | Status: AC
  Administered 2018-01-23: 20 mg via ORAL

## 2018-01-23 MED ORDER — FENTANYL CITRATE (PF) 100 MCG/2ML IJ SOLN: 25.0000 ug | INTRAMUSCULAR | Status: DC | PRN

## 2018-01-23 MED ORDER — DEXAMETHASONE SODIUM PHOSPHATE 10 MG/ML IJ SOLN
INTRAMUSCULAR | Status: AC
  Filled 2018-01-23: qty 1

## 2018-01-23 MED ORDER — PROPOFOL 10 MG/ML IV BOLUS
INTRAVENOUS | Status: DC | PRN
  Administered 2018-01-23: 200 mg via INTRAVENOUS

## 2018-01-23 MED ORDER — FENTANYL CITRATE (PF) 100 MCG/2ML IJ SOLN
INTRAMUSCULAR | Status: DC | PRN
  Administered 2018-01-23 (×4): 25 ug via INTRAVENOUS

## 2018-01-23 MED ORDER — KETOROLAC TROMETHAMINE 30 MG/ML IJ SOLN
INTRAMUSCULAR | Status: DC | PRN
  Administered 2018-01-23: 30 mg via INTRAVENOUS

## 2018-01-23 MED ORDER — MIDAZOLAM HCL 2 MG/2ML IJ SOLN
INTRAMUSCULAR | Status: AC
  Filled 2018-01-23: qty 2

## 2018-01-23 MED ORDER — CEFAZOLIN SODIUM-DEXTROSE 2-4 GM/100ML-% IV SOLN
2.0000 g | INTRAVENOUS | Status: AC
  Administered 2018-01-23: 2 g via INTRAVENOUS

## 2018-01-23 MED ORDER — LIDOCAINE HCL (PF) 2 % IJ SOLN
INTRAMUSCULAR | Status: AC
  Filled 2018-01-23: qty 10

## 2018-01-23 MED ORDER — SUCCINYLCHOLINE CHLORIDE 20 MG/ML IJ SOLN
INTRAMUSCULAR | Status: AC
  Filled 2018-01-23: qty 1

## 2018-01-23 MED ORDER — ONDANSETRON HCL 4 MG/2ML IJ SOLN
INTRAMUSCULAR | Status: DC | PRN
  Administered 2018-01-23: 4 mg via INTRAVENOUS

## 2018-01-23 MED ORDER — HEPARIN SODIUM (PORCINE) 5000 UNIT/ML IJ SOLN: 5000.0000 [IU] | Freq: Once | INTRAMUSCULAR | Status: DC

## 2018-01-23 MED ORDER — FENTANYL CITRATE (PF) 100 MCG/2ML IJ SOLN
INTRAMUSCULAR | Status: AC
  Filled 2018-01-23: qty 2

## 2018-01-23 MED ORDER — CHLORHEXIDINE GLUCONATE CLOTH 2 % EX PADS: 6.0000 | MEDICATED_PAD | Freq: Once | CUTANEOUS | Status: DC

## 2018-01-23 MED ORDER — HEPARIN SODIUM (PORCINE) 5000 UNIT/ML IJ SOLN
INTRAMUSCULAR | Status: AC
  Filled 2018-01-23: qty 1

## 2018-01-23 MED ORDER — DEXAMETHASONE SODIUM PHOSPHATE 10 MG/ML IJ SOLN
INTRAMUSCULAR | Status: DC | PRN
  Administered 2018-01-23: 4 mg via INTRAVENOUS

## 2018-01-23 MED ORDER — BUPIVACAINE-EPINEPHRINE 0.5% -1:200000 IJ SOLN
INTRAMUSCULAR | Status: DC | PRN
  Administered 2018-01-23: 30 mL

## 2018-01-23 MED ORDER — KETOROLAC TROMETHAMINE 30 MG/ML IJ SOLN
INTRAMUSCULAR | Status: AC
  Filled 2018-01-23: qty 1

## 2018-01-23 MED ORDER — LACTATED RINGERS IV SOLN
INTRAVENOUS | Status: DC | PRN
  Administered 2018-01-23: 11:00:00 via INTRAVENOUS

## 2018-01-23 MED ORDER — ONDANSETRON HCL 4 MG/2ML IJ SOLN: 4.0000 mg | Freq: Once | INTRAMUSCULAR | Status: DC | PRN

## 2018-01-23 MED ORDER — ONDANSETRON HCL 4 MG/2ML IJ SOLN
INTRAMUSCULAR | Status: AC
  Filled 2018-01-23: qty 2

## 2018-01-23 MED ORDER — PROPOFOL 10 MG/ML IV BOLUS
INTRAVENOUS | Status: AC
  Filled 2018-01-23: qty 20

## 2018-01-23 MED ORDER — LIDOCAINE HCL (CARDIAC) PF 100 MG/5ML IV SOSY
PREFILLED_SYRINGE | INTRAVENOUS | Status: DC | PRN
  Administered 2018-01-23: 100 mg via INTRAVENOUS

## 2018-01-23 MED ORDER — CEFAZOLIN SODIUM-DEXTROSE 2-4 GM/100ML-% IV SOLN
INTRAVENOUS | Status: AC
  Filled 2018-01-23: qty 100

## 2018-01-23 MED ORDER — HYDROCODONE-ACETAMINOPHEN 5-300 MG PO TABS: 1.0000 | ORAL_TABLET | ORAL | 0 refills | Status: DC | PRN

## 2018-01-23 MED ORDER — MIDAZOLAM HCL 2 MG/2ML IJ SOLN
INTRAMUSCULAR | Status: DC | PRN
  Administered 2018-01-23: 2 mg via INTRAVENOUS

## 2018-01-23 SURGICAL SUPPLY — 52 items
ADH LQ OCL WTPRF AMP STRL LF (MISCELLANEOUS) ×1
ADH SKN CLS APL DERMABOND .7 (GAUZE/BANDAGES/DRESSINGS)
ADHESIVE MASTISOL STRL (MISCELLANEOUS) ×2 IMPLANT
BINDER BREAST LRG (GAUZE/BANDAGES/DRESSINGS) IMPLANT
BINDER BREAST MEDIUM (GAUZE/BANDAGES/DRESSINGS) IMPLANT
BLADE PHOTON ILLUMINATED (MISCELLANEOUS) ×3 IMPLANT
BLADE SURG 15 STRL LF DISP TIS (BLADE) ×1 IMPLANT
BLADE SURG 15 STRL SS (BLADE) ×3
CANISTER SUCT 1200ML W/VALVE (MISCELLANEOUS) ×3 IMPLANT
CHLORAPREP W/TINT 26ML (MISCELLANEOUS) ×3 IMPLANT
CLIP VESOCCLUDE SM WIDE 6/CT (CLIP) ×1 IMPLANT
CLOSURE WOUND 1/2 X4 (GAUZE/BANDAGES/DRESSINGS) ×1
CNTNR SPEC 2.5X3XGRAD LEK (MISCELLANEOUS)
CONT SPEC 4OZ STER OR WHT (MISCELLANEOUS)
CONT SPEC 4OZ STRL OR WHT (MISCELLANEOUS)
CONTAINER SPEC 2.5X3XGRAD LEK (MISCELLANEOUS) ×2 IMPLANT
COVER PROBE FLX POLY STRL (MISCELLANEOUS) ×1 IMPLANT
DERMABOND ADVANCED (GAUZE/BANDAGES/DRESSINGS)
DERMABOND ADVANCED .7 DNX12 (GAUZE/BANDAGES/DRESSINGS) ×2 IMPLANT
DEVICE DUBIN SPECIMEN MAMMOGRA (MISCELLANEOUS) ×3 IMPLANT
DRAPE LAPAROTOMY 100X77 ABD (DRAPES) ×3 IMPLANT
DRSG GAUZE FLUFF 36X18 (GAUZE/BANDAGES/DRESSINGS) ×5 IMPLANT
ELECT CAUTERY BLADE 6.4 (BLADE) ×3 IMPLANT
ELECT REM PT RETURN 9FT ADLT (ELECTROSURGICAL) ×3
ELECTRODE REM PT RTRN 9FT ADLT (ELECTROSURGICAL) ×1 IMPLANT
GLOVE SURG SYN 7.0 (GLOVE) ×3 IMPLANT
GLOVE SURG SYN 7.0 PF PI (GLOVE) ×1 IMPLANT
GLOVE SURG SYN 7.5  E (GLOVE) ×2
GLOVE SURG SYN 7.5 E (GLOVE) ×1 IMPLANT
GLOVE SURG SYN 7.5 PF PI (GLOVE) ×1 IMPLANT
GOWN STRL REUS W/ TWL LRG LVL3 (GOWN DISPOSABLE) ×1 IMPLANT
GOWN STRL REUS W/TWL LRG LVL3 (GOWN DISPOSABLE) ×3
KIT TURNOVER KIT A (KITS) ×3 IMPLANT
LABEL OR SOLS (LABEL) ×3 IMPLANT
NDL FILTER BLUNT 18X1 1/2 (NEEDLE) ×1 IMPLANT
NDL HYPO 25X1 1.5 SAFETY (NEEDLE) ×1 IMPLANT
NEEDLE FILTER BLUNT 18X 1/2SAF (NEEDLE) ×2
NEEDLE FILTER BLUNT 18X1 1/2 (NEEDLE) ×1 IMPLANT
NEEDLE HYPO 22GX1.5 SAFETY (NEEDLE) ×3 IMPLANT
NEEDLE HYPO 25X1 1.5 SAFETY (NEEDLE) ×3 IMPLANT
PACK BASIN MINOR ARMC (MISCELLANEOUS) ×3 IMPLANT
SLEVE PROBE SENORX GAMMA FIND (MISCELLANEOUS) ×1 IMPLANT
STRIP CLOSURE SKIN 1/2X4 (GAUZE/BANDAGES/DRESSINGS) ×1 IMPLANT
SUT MNCRL 4-0 (SUTURE) ×6
SUT MNCRL 4-0 27XMFL (SUTURE) ×2
SUT SILK 3 0 SH 30 (SUTURE) ×1 IMPLANT
SUT VIC AB 3-0 SH 27 (SUTURE) ×3
SUT VIC AB 3-0 SH 27X BRD (SUTURE) ×2 IMPLANT
SUTURE MNCRL 4-0 27XMF (SUTURE) ×2 IMPLANT
SYR 10ML LL (SYRINGE) ×6 IMPLANT
SYR BULB IRRIG 60ML STRL (SYRINGE) ×3 IMPLANT
WATER STERILE IRR 1000ML POUR (IV SOLUTION) ×3 IMPLANT

## 2018-01-23 NOTE — Op Note (Signed)
  Pre-operative Diagnosis: DCIS    Post-operative Diagnosis: Same   Surgeon: Jerrol Banana. Burt Knack, MD FACS  Anesthesia: surgical tech  Procedure: left Partial mastectomy, needle directed  Procedure Details  The patient was seen again in the Holding Room. The benefits, complications, treatment options, and expected outcomes were discussed with the patient. The risks of bleeding, infection, recurrence of symptoms, failure to resolve symptoms, hematoma, seroma, open wound, cosmetic deformity, and the need for further surgery were discussed. We discussed in detail that no SN Bx was indicated. The patient was taken to Operating Room, identified as Denise Reyes and the procedure verified.  A Time Out was held and the above information confirmed.  Prior to the induction of general anesthesia, antibiotic prophylaxis was administered. VTE prophylaxis was in place. Appropriate anesthesia was then administered and tolerated well. The chest was prepped with Chloraprep and draped in the sterile fashion. The patient was positioned in the supine position. Needle loc placement films were reviewed.   Attention was turned to the needle localization site where an incision was made encompassing the needle insertion site and the prior biopsy site. Dissection around the needle to perform a partial mastectomy with adequate margins was performed. This was done with electrocautery and sharp dissection. Hemostasis was with electrocautery. Additional Marcaine was infiltrated into the skin and subcutaneous tissues of the cavity. Once assuring that hemostasis was adequate and checked multiple times the wound was closed with interrupted 3-0 Vicryl followed by 4-0 subcuticular Monocryl sutures.  The specimen wwas labeled with long lateral, short superior silk sutuers and sent for specimen mammogram. Personal review and verbal confirmation showed that theintact needle was removed as was the marking clip.   Steri-Strips Mastisol  and sterile dressings were placed  Patient was taken to the recovery room in stable condition where a postoperative chest film has been ordered.    Findings: Specimen mammogram as above  Estimated Blood Loss: Minimal         Drains: None         Specimens: partial mastectomy with labels, long lateral and short superior       Complications: none                  Condition: Stable   Denise Reyes E. Burt Knack, MD, FACS

## 2018-01-23 NOTE — Transfer of Care (Signed)
Immediate Anesthesia Transfer of Care Note  Patient: Denise Reyes  Procedure(s) Performed: BREAST BIOPSY/PARTIAL MASTECTOMY WITH NEEDLE LOCALIZATION (Left Breast)  Patient Location: PACU  Anesthesia Type:General  Level of Consciousness: awake  Airway & Oxygen Therapy: Patient Spontanous Breathing  Post-op Assessment: Report given to RN  Post vital signs: stable  Last Vitals:  Vitals Value Taken Time  BP 113/70 01/23/2018 11:55 AM  Temp    Pulse 117 01/23/2018 11:55 AM  Resp 19 01/23/2018 11:55 AM  SpO2 100 % 01/23/2018 11:55 AM  Vitals shown include unvalidated device data.  Last Pain:  Vitals:   01/23/18 0856  TempSrc: Tympanic  PainSc: 0-No pain         Complications: No apparent anesthesia complications

## 2018-01-23 NOTE — Discharge Instructions (Signed)
Remove dressing in 24 hours. °May shower in 24 hours. °Leave paper strips in place. °Resume all home medications. °Follow-up with Dr. Tiffany Calmes in 10 days. °

## 2018-01-23 NOTE — Anesthesia Preprocedure Evaluation (Addendum)
Anesthesia Evaluation  Patient identified by MRN, date of birth, ID band Patient awake    Reviewed: Allergy & Precautions, NPO status , Patient's Chart, lab work & pertinent test results  Airway Mallampati: II  TM Distance: >3 FB     Dental  (+) Chipped   Pulmonary asthma , Current Smoker,    Pulmonary exam normal        Cardiovascular negative cardio ROS Normal cardiovascular exam     Neuro/Psych Anxiety negative neurological ROS     GI/Hepatic Neg liver ROS, GERD  Controlled,  Endo/Other  negative endocrine ROS  Renal/GU negative Renal ROS  negative genitourinary   Musculoskeletal  (+) Arthritis , Osteoarthritis,    Abdominal Normal abdominal exam  (+)   Peds negative pediatric ROS (+)  Hematology negative hematology ROS (+)   Anesthesia Other Findings   Reproductive/Obstetrics                             Anesthesia Physical Anesthesia Plan  ASA: II  Anesthesia Plan: General   Post-op Pain Management:    Induction: Intravenous  PONV Risk Score and Plan:   Airway Management Planned: Oral ETT and LMA  Additional Equipment:   Intra-op Plan:   Post-operative Plan: Extubation in OR  Informed Consent: I have reviewed the patients History and Physical, chart, labs and discussed the procedure including the risks, benefits and alternatives for the proposed anesthesia with the patient or authorized representative who has indicated his/her understanding and acceptance.   Dental advisory given  Plan Discussed with: CRNA and Surgeon  Anesthesia Plan Comments:        Anesthesia Quick Evaluation

## 2018-01-23 NOTE — Anesthesia Procedure Notes (Signed)
Procedure Name: LMA Insertion Date/Time: 01/23/2018 11:13 AM Performed by: Geraldine Contras, CRNA Pre-anesthesia Checklist: Patient identified, Emergency Drugs available, Suction available, Patient being monitored and Timeout performed Patient Re-evaluated:Patient Re-evaluated prior to induction Oxygen Delivery Method: Circle system utilized Preoxygenation: Pre-oxygenation with 100% oxygen Induction Type: IV induction LMA: LMA inserted LMA Size: 3.5 Number of attempts: 1 Placement Confirmation: positive ETCO2 Dental Injury: Teeth and Oropharynx as per pre-operative assessment

## 2018-01-23 NOTE — Progress Notes (Signed)
Preoperative Review   Patient is met in the preoperative holding area. The history is reviewed in the chart and with the patient.  She has had her wire placed without difficulty.  I personally reviewed the options and rationale as well as the risks of this procedure that have been previously discussed with the patient.  Specifically we discussed the fact that this is a wide local excision with margins.  We also discussed that because of her low risk lesion (small size and no suspicious or high risk characteristics on pathology) that we would not be performing a sentinel node biopsy.  The need for further surgery for margins if invasive carcinoma was identified or for sentinel node biopsy if invasive carcinoma was identified was discussed as well.  We reviewed the risks of bleeding infection cosmetic deformity and the need for further therapy if needed.  All questions asked by the patient and/or family were answered to their satisfaction.  No family was present.  Patient was marked.  Patient agrees to proceed with this procedure at this time.  Florene Glen M.D. FACS

## 2018-01-23 NOTE — Progress Notes (Signed)
Rev'd options, rationale and risks again with pt and multiple family members. All questions answered.

## 2018-01-23 NOTE — Anesthesia Postprocedure Evaluation (Signed)
Anesthesia Post Note  Patient: Denise Reyes  Procedure(s) Performed: BREAST BIOPSY/PARTIAL MASTECTOMY WITH NEEDLE LOCALIZATION (Left Breast)  Patient location during evaluation: PACU Anesthesia Type: General Level of consciousness: awake and alert and oriented Pain management: pain level controlled Vital Signs Assessment: post-procedure vital signs reviewed and stable Respiratory status: spontaneous breathing Cardiovascular status: blood pressure returned to baseline Anesthetic complications: no     Last Vitals:  Vitals:   01/23/18 1245 01/23/18 1313  BP: (!) 105/59 (P) 104/70  Pulse: 100 (P) 98  Resp: 16 (P) 18  Temp: 36.4 C   SpO2: 97% (P) 100%    Last Pain:  Vitals:   01/23/18 1313  TempSrc:   PainSc: (P) 0-No pain                 Gerrad Welker

## 2018-01-23 NOTE — Anesthesia Post-op Follow-up Note (Signed)
Anesthesia QCDR form completed.        

## 2018-01-24 ENCOUNTER — Encounter: Payer: Self-pay | Admitting: Surgery

## 2018-01-25 LAB — SURGICAL PATHOLOGY

## 2018-01-28 ENCOUNTER — Telehealth: Payer: Self-pay

## 2018-01-28 NOTE — Telephone Encounter (Signed)
FMLA paperwork completed and faxed to [phone] [employer]. Patient notified. Placed in copy folder to be scanned.

## 2018-01-29 ENCOUNTER — Encounter: Payer: Self-pay | Admitting: Surgery

## 2018-01-29 ENCOUNTER — Ambulatory Visit (INDEPENDENT_AMBULATORY_CARE_PROVIDER_SITE_OTHER): Payer: Managed Care, Other (non HMO) | Admitting: Surgery

## 2018-01-29 VITALS — BP 111/74 | HR 97 | Temp 97.8°F | Ht 59.0 in | Wt 135.0 lb

## 2018-01-29 DIAGNOSIS — D0512 Intraductal carcinoma in situ of left breast: Secondary | ICD-10-CM

## 2018-01-29 NOTE — Patient Instructions (Addendum)
We will send the referral to Dr.Yu and someone from their office will call you within 5 days. If you do not hear from anyone please call our office and let us know.    We will call you in 6 months December 2019 for a follow up Mammogram and breast exam. If you do not hear from [place] by the end of November please call our office to schedule.  Breast Self-Awareness Breast self-awareness means:  Knowing how your breasts look.  Knowing how your breasts feel.  Checking your breasts every month for changes.  Telling your doctor if you notice a change in your breasts.  Breast self-awareness allows you to notice a breast problem early while it is still small. How to do a breast self-exam One way to learn what is normal for your breasts and to check for changes is to do a breast self-exam. To do a breast self-exam: Look for Changes  1. Take off all the clothes above your waist. 2. Stand in front of a mirror in a room with good lighting. 3. Put your hands on your hips. 4. Push your hands down. 5. Look at your breasts and nipples in the mirror to see if one breast or nipple looks different than the other. Check to see if: ? The shape of one breast is different. ? The size of one breast is different. ? There are wrinkles, dips, and bumps in one breast and not the other. 6. Look at each breast for changes in your skin, such as: ? Redness. ? Scaly areas. 7. Look for changes in your nipples, such as: ? Liquid around the nipples. ? Bleeding. ? Dimpling. ? Redness. ? A change in where the nipples are. Feel for Changes 1. Lie on your back on the floor. 2. Feel each breast. To do this, follow these steps: ? Pick a breast to feel. ? Put the arm closest to that breast above your head. ? Use your other arm to feel the nipple area of your breast. Feel the area with the pads of your three middle fingers by making small circles with your fingers. For the first circle, press lightly. For the second  circle, press harder. For the third circle, press even harder. ? Keep making circles with your fingers at the light, harder, and even harder pressures as you move down your breast. Stop when you feel your ribs. ? Move your fingers a little toward the center of your body. ? Start making circles with your fingers again, this time going up until you reach your collarbone. ? Keep making up and down circles until you reach your armpit. Remember to keep using the three pressures. ? Feel the other breast in the same way. 3. Sit or stand in the shower or tub. 4. With soapy water on your skin, feel each breast the same way you did in step 2, when you were lying on the floor. Write Down What You Find  After doing the self-exam, write down:  What is normal for each breast.  Any changes you find in each breast.  When you last had your period.  How often should I check my breasts? Check your breasts every month. If you are breastfeeding, the best time to check them is after you feed your baby or after you use a breast pump. If you get periods, the best time to check your breasts is 5-7 days after your period is over. When should I see my doctor? See your  doctor if you notice:  A change in shape or size of your breasts or nipples.  A change in the skin of your breast or nipples, such as red or scaly skin.  Unusual fluid coming from your nipples.  A lump or thick area that was not there before.  Pain in your breasts.  Anything that concerns you.  This information is not intended to replace advice given to you by your health care provider. Make sure you discuss any questions you have with your health care provider. Document Released: 01/03/2008 Document Revised: 12/23/2015 Document Reviewed: 06/06/2015 Elsevier Interactive Patient Education  [employer].

## 2018-01-29 NOTE — Progress Notes (Signed)
Outpatient postop visit  01/29/2018  Denise Reyes is an 52 y.o. female.    Procedure: Left needle localization breast biopsy  CC: No problems  HPI: Patient describes no problems following her left needle localization breast biopsy.  She had had a core biopsy showing DCIS which was quite small.  She underwent the left needle localization partial mastectomy for margins and the pathology showed that there was just scar and no further DCIS was noted.  She is doing well at this time and informed of those findings.  Medications reviewed.    Physical Exam:  BP 111/74   Pulse 97   Temp 97.8 F (36.6 C) (Oral)   Ht 4\' 11"  (1.499 m)   Wt 135 lb (61.2 kg)   BMI 27.27 kg/m     PE: No ecchymosis wound is clean no erythema no drainage    Assessment/Plan:  DCIS on core biopsy.  Partial mastectomy failed to identify any further DCIS in the specimen was removed with clip in place.  Patient doing very well at this time. I discussed findings DCIS on core biopsy but none found on ultimate biopsy.  I will refer her to oncology for discussion concerning antiestrogen's if indicated.  Her sister has ovarian cancer and has been [facility] -1 and 2 but she may require testing on her own.  She will follow-up with a six-month left-sided mammogram for new baseline.  Florene Glen, MD, FACS

## 2018-02-06 ENCOUNTER — Inpatient Hospital Stay: Payer: Managed Care, Other (non HMO) | Attending: Oncology | Admitting: Oncology

## 2018-02-06 ENCOUNTER — Encounter: Payer: Self-pay | Admitting: Oncology

## 2018-02-06 ENCOUNTER — Other Ambulatory Visit: Payer: Self-pay

## 2018-02-06 VITALS — BP 113/75 | HR 93 | Temp 97.1°F | Resp 18 | Ht 59.0 in | Wt 134.6 lb

## 2018-02-06 DIAGNOSIS — D0512 Intraductal carcinoma in situ of left breast: Secondary | ICD-10-CM | POA: Insufficient documentation

## 2018-02-06 NOTE — Progress Notes (Signed)
Patient here for initial visit. °

## 2018-02-06 NOTE — Progress Notes (Addendum)
Hematology/Oncology Consult note [facility] Telephone:([phone] Fax:(336) [phone]   Patient Care Team: Marguerita Merles, MD as PCP - General (Family Medicine)  REFERRING PROVIDER: Dr.Cooper. CHIEF COMPLAINTS/REASON FOR VISIT:  Evaluation of DCIS  HISTORY OF PRESENTING ILLNESS:  Denise Reyes is a  52 y.o.  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   Patient had a mammogram done in May 2019.  Mammogram showed indeterminate calcifications in the upper outer quadrant of the left breast for which biopsy is indicated.  Patient is status post stereotactic guided core biopsy of the left breast calcifications. 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   Patient underwent left breast lumpectomy on 01/23/2018, pathology showed fibrocystic change with microcalcifications Patient was referred to oncology for further evaluation and management.   Nipple discharge: Denies Family history: breast cancer in paternal grandmother at age of 35. Sister has Ovarian cancer. Per patient, her sister has negative genetic tests.  Estrogen and progesterone therapy: yes. She reports that her menstrual period stopped at early 51s for at least a year and then she started hormone replacement for a few years and stopped.  Abdominal hysterotomy.  History of radiation to chest: denies.   Review of Systems  Constitutional: Negative for chills, fever, malaise/fatigue and weight loss.  HENT: Negative for nosebleeds and sore throat.   Eyes: Negative for double vision, photophobia and redness.  Respiratory: Negative for cough, shortness of breath and wheezing.   Cardiovascular: Negative for chest pain, palpitations and orthopnea.  Gastrointestinal: Negative for abdominal pain, blood in stool, nausea and  vomiting.  Genitourinary: Negative for dysuria.  Musculoskeletal: Negative for back pain, myalgias and neck pain.  Skin: Negative for itching and rash.  Neurological: Negative for dizziness, tingling and tremors.  Endo/Heme/Allergies: Negative for environmental allergies. Does not bruise/bleed easily.  Psychiatric/Behavioral: Negative for depression.    MEDICAL HISTORY:  Past Medical History:  Diagnosis Date  . Allergy    PT CURRENTLY TAKING ABX AND PREDNISONE GIVEN BY DR Tami Ribas ON 01-16-18 FOR INFECTION THAT SHE GETS THIS TIME EVERY YEAR  . Anxiety   . Arthritis    knees, wrists  . Asthma    allergy induced   . Cancer ([place])   . GERD (gastroesophageal reflux disease)    occ-no meds  . History of kidney stones    h/o  . Problems with swallowing and mastication   . Special screening for malignant neoplasms, colon   . Wears contact lenses     SURGICAL HISTORY: Past Surgical History:  Procedure Laterality Date  . ABDOMINAL HYSTERECTOMY    . BREAST BIOPSY Left    DUCTAL CARCINOMA IN SITU (DCIS), LOW NUCLEAR GRADE.  Marland Kitchen BREAST BIOPSY Left 01/23/2018   Procedure: BREAST BIOPSY/PARTIAL MASTECTOMY WITH NEEDLE LOCALIZATION;  Surgeon: Florene Glen, MD;  Location: [facility];  Service: General;  Laterality: Left;  . BREAST EXCISIONAL BIOPSY Left 01/23/2018   lumectomy with NL   . COLONOSCOPY WITH PROPOFOL N/A 07/07/2016   Procedure: COLONOSCOPY WITH PROPOFOL;  Surgeon: Lucilla Lame, MD;  Location: [place];  Service: Endoscopy;  Laterality: N/A;  . ESOPHAGOGASTRODUODENOSCOPY (EGD) WITH PROPOFOL N/A 07/07/2016   Procedure: ESOPHAGOGASTRODUODENOSCOPY (EGD) WITH PROPOFOL;  Surgeon: Lucilla Lame, MD;  Location: [place];  Service: Endoscopy;  Laterality: N/A;  . URETHRA SURGERY     14  months old    SOCIAL HISTORY: Social History   Socioeconomic History  . Marital status: Single    Spouse name: Not on file  . Number of children: Not on file  . Years of  education: Not on file  . Highest education level: Not on file  Occupational History  . Not on file  Social Needs  . Financial resource strain: Not on file  . Food insecurity:    Worry: Not on file    Inability: Not on file  . Transportation needs:    Medical: Not on file    Non-medical: Not on file  Tobacco Use  . Smoking status: Current Every Day Smoker    Packs/day: 1.00    Years: 35.00    Pack years: 35.00    Types: Cigarettes  . Smokeless tobacco: Never Used  Substance and Sexual Activity  . Alcohol use: Yes    Alcohol/week: 1.2 oz    Types: 2 Cans of beer per week    Comment: occ weekend beers  . Drug use: No  . Sexual activity: Not on file  Lifestyle  . Physical activity:    Days per week: Not on file    Minutes per session: Not on file  . Stress: Not on file  Relationships  . Social connections:    Talks on phone: Not on file    Gets together: Not on file    Attends religious service: Not on file    Active member of club or organization: Not on file    Attends meetings of clubs or organizations: Not on file    Relationship status: Not on file  . Intimate partner violence:    Fear of current or ex partner: Not on file    Emotionally abused: Not on file    Physically abused: Not on file    Forced sexual activity: Not on file  Other Topics Concern  . Not on file  Social History Narrative  . Not on file    FAMILY HISTORY: Family History  Problem Relation Age of Onset  . Hypertension Mother   . High Cholesterol Mother   . Hypertension Father   . Heart disease Father   . COPD Father   . High Cholesterol Father   . Heart disease Paternal Grandfather   . Cancer Sister        ovarian  . Cancer Maternal Aunt   . Cancer Paternal Grandmother 59       breast    ALLERGIES:  has No Known Allergies.  MEDICATIONS:  Current Outpatient Medications  Medication Sig Dispense Refill  . albuterol (PROVENTIL HFA;VENTOLIN HFA) 108 (90 Base) MCG/ACT inhaler Inhale  2 puffs into the lungs 2 (two) times daily as needed for wheezing or shortness of breath.   4  . ALPRAZolam (XANAX) 0.5 MG tablet Take 0.5 mg by mouth 3 (three) times daily as needed for anxiety.    Marland Kitchen atorvastatin (LIPITOR) 20 MG tablet Take 20 mg by mouth every evening.    . cefdinir (OMNICEF) 300 MG capsule Take 300 mg by mouth 2 (two) times daily. Started 01/16/2018 - 14 day course    . loratadine (CLARITIN) 10 MG tablet Take 10 mg by mouth daily.    . vitamin C (ASCORBIC ACID) 500 MG tablet Take 500 mg by mouth daily.    . cetirizine-pseudoephedrine (ZYRTEC-D) 5-120 MG tablet Take 1 tablet by mouth 2 (two) times daily.    . predniSONE (STERAPRED UNI-PAK 21 TAB) 10  MG (21) TBPK tablet Take 10 mg by mouth See admin instructions. Prednisone 6 day taper     No current facility-administered medications for this visit.      PHYSICAL EXAMINATION: ECOG PERFORMANCE STATUS: 0 - Asymptomatic Vitals:   02/06/18 1124  BP: 113/75  Pulse: 93  Resp: 18  Temp: (!) 97.1 F (36.2 C)   Filed Weights   02/06/18 1124  Weight: 134 lb 9.6 oz (61.1 kg)    Physical Exam  Constitutional: She is oriented to person, place, and time. She appears well-developed and well-nourished. No distress.  HENT:  Head: Normocephalic and atraumatic.  Right Ear: External ear normal.  Left Ear: External ear normal.  Mouth/Throat: Oropharynx is clear and moist.  Eyes: Pupils are equal, round, and reactive to light. Conjunctivae and EOM are normal. No scleral icterus.  Neck: Normal range of motion. Neck supple.  Cardiovascular: Normal rate, regular rhythm and normal heart sounds.  Pulmonary/Chest: Effort normal and breath sounds normal. No respiratory distress. She has no wheezes. She has no rales. She exhibits no tenderness.  Abdominal: Soft. Bowel sounds are normal. She exhibits no distension and no mass. There is no tenderness.  Musculoskeletal: Normal range of motion. She exhibits no edema or deformity.    Lymphadenopathy:    She has no cervical adenopathy.  Neurological: She is alert and oriented to person, place, and time. No cranial nerve deficit. Coordination normal.  Skin: Skin is warm and dry. No rash noted.  Psychiatric: She has a normal mood and affect. Her behavior is normal. Thought content normal.   Breast exam was performed in seated and lying down position. Patient is status post left breast lumpectomy with a well-healed surgical scar. No evidence of any palpable masses. No evidence of axillary adenopathy.    LABORATORY DATA:  I have reviewed the data as listed Lab Results  Component Value Date   WBC 10.4 01/21/2018   HGB 14.4 01/21/2018   HCT 41.3 01/21/2018   MCV 94.2 01/21/2018   PLT 372 01/21/2018   Recent Labs    01/21/18 0854  NA 136  K 3.4*  CL 102  CO2 25  GLUCOSE 83  BUN 13  CREATININE 0.64  CALCIUM 8.8*  GFRNONAA >60  GFRAA >60   Iron/TIBC/Ferritin/ %Sat No results found for: IRON, TIBC, FERRITIN, IRONPCTSAT   RADIOGRAPHIC STUDIES: I have personally reviewed the radiological images as listed and agreed with the findings in the report. 12/11/2017  1. Benign RIGHT breast calcifications. 2. Indeterminate calcifications in the UPPER OUTER QUADRANT of the LEFT breast for which biopsy is indicated. 3. No sonographic or mammographic abnormality in the 4 o'clock location area of clinical concern. ASSESSMENT & PLAN:  1. Ductal carcinoma in situ (DCIS) of left breast    Pathology result were discussed with patient. Explained to patient that DCIS is a non invasive breast cancer.  Recommend adjuvant RT  followed by adjuvant anti estrogen therapy. Patient has questions about if Radiation or estrogen therapy can be omitted.  Will discuss patient's case on breast tumor board.   # chest menopausal state. FSH, LH, estradiol.   Orders Placed This Encounter  Procedures  . CBC with Differential/Platelet    Standing Status:   Future    Standing Expiration  Date:   02/07/2019  . Comprehensive metabolic panel    Standing Status:   Future    Standing Expiration Date:   02/07/2019  . FSH/LH    Standing Status:   Future  Standing Expiration Date:   02/10/2019  . Estradiol    Standing Status:   Future    Standing Expiration Date:   02/09/2019  . Follicle stimulating hormone    Standing Status:   Future    Standing Expiration Date:   02/09/2019  . Luteinizing hormone    Standing Status:   Future    Standing Expiration Date:   02/09/2019    All questions were answered. The patient knows to call the clinic with any problems questions or concerns. Cc Dr.Cooper Return of visit: 1 week Thank you for this kind referral and the opportunity to participate in the care of this patient. A copy of today's note is routed to referring provider  Total face to face encounter time for this patient visit was 60 min. >50% of the time was  spent in counseling and coordination of care.    Earlie Server, MD, PhD Hematology Oncology [facility] Pager- [phone] 02/06/2018

## 2018-02-12 ENCOUNTER — Inpatient Hospital Stay (HOSPITAL_BASED_OUTPATIENT_CLINIC_OR_DEPARTMENT_OTHER): Payer: Managed Care, Other (non HMO) | Admitting: Oncology

## 2018-02-12 ENCOUNTER — Encounter: Payer: Self-pay | Admitting: Oncology

## 2018-02-12 ENCOUNTER — Inpatient Hospital Stay: Payer: Managed Care, Other (non HMO)

## 2018-02-12 ENCOUNTER — Other Ambulatory Visit: Payer: Self-pay

## 2018-02-12 VITALS — BP 108/73 | HR 103 | Temp 98.2°F | Resp 18 | Wt 133.0 lb

## 2018-02-12 DIAGNOSIS — D0512 Intraductal carcinoma in situ of left breast: Secondary | ICD-10-CM | POA: Diagnosis not present

## 2018-02-12 LAB — COMPREHENSIVE METABOLIC PANEL
ALK PHOS: 57 U/L (ref 38–126)
ALT: 26 U/L (ref 0–44)
AST: 23 U/L (ref 15–41)
Albumin: 3.8 g/dL (ref 3.5–5.0)
Anion gap: 8 (ref 5–15)
BILIRUBIN TOTAL: 0.9 mg/dL (ref 0.3–1.2)
BUN: 9 mg/dL (ref 6–20)
CO2: 23 mmol/L (ref 22–32)
Calcium: 9 mg/dL (ref 8.9–10.3)
Chloride: 105 mmol/L (ref 98–111)
Creatinine, Ser: 0.59 mg/dL (ref 0.44–1.00)
GFR calc Af Amer: >60 mL/min (ref >60)
GFR calc non Af Amer: >60 mL/min (ref >60)
Glucose, Bld: 94 mg/dL (ref 70–99)
Potassium: 4 mmol/L (ref 3.5–5.1)
Sodium: 136 mmol/L (ref 135–145)
TOTAL PROTEIN: 6.7 g/dL (ref 6.5–8.1)

## 2018-02-12 LAB — CBC WITH DIFFERENTIAL/PLATELET
BASOS ABS: 0.1 10*3/uL (ref 0–0.1)
BASOS PCT: 1 %
Eosinophils Absolute: 0.1 10*3/uL (ref 0–0.7)
Eosinophils Relative: 1 %
HEMATOCRIT: 39.5 % (ref 35.0–47.0)
Hemoglobin: 14.1 g/dL (ref 12.0–16.0)
LYMPHS PCT: 33 %
Lymphs Abs: 2 10*3/uL (ref 1.0–3.6)
MCH: 33.1 pg (ref 26.0–34.0)
MCHC: 35.7 g/dL (ref 32.0–36.0)
MCV: 92.7 fL (ref 80.0–100.0)
Monocytes Absolute: 0.5 10*3/uL (ref 0.2–0.9)
Monocytes Relative: 8 %
NEUTROS ABS: 3.5 10*3/uL (ref 1.4–6.5)
NEUTROS PCT: 57 %
Platelets: 318 10*3/uL (ref 150–440)
RBC: 4.26 MIL/uL (ref 3.80–5.20)
RDW: 12.6 % (ref 11.5–14.5)
WBC: 6.1 10*3/uL (ref 3.6–11.0)

## 2018-02-12 NOTE — Progress Notes (Signed)
Patient here for follow up. No concerns voiced.  °

## 2018-02-13 LAB — FSH/LH
FSH: 86.6 m[IU]/mL
LH: 56.1 m[IU]/mL

## 2018-02-13 LAB — ESTRADIOL

## 2018-02-13 NOTE — Progress Notes (Signed)
Hematology/Oncology follow up  note [facility] Telephone:(336) [phone] Fax:(336) [phone]   Patient Care Team: Marguerita Merles, MD as PCP - General (Family Medicine)  REFERRING PROVIDER: Dr.Cooper. CHIEF COMPLAINTS/REASON FOR VISIT:  Evaluation of DCIS  HISTORY OF PRESENTING ILLNESS:  Denise Reyes is a  52 y.o.  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   Patient had a mammogram done in May 2019.  Mammogram showed indeterminate calcifications in the upper outer quadrant of the left breast for which biopsy is indicated.  Patient is status post stereotactic guided core biopsy of the left breast calcifications. 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   Patient underwent left breast lumpectomy on 01/23/2018, pathology showed fibrocystic change with microcalcifications Patient was referred to oncology for further evaluation and management.   Nipple discharge: Denies Family history: breast cancer in paternal grandmother at age of 66. Sister has Ovarian cancer. Per patient, her sister has negative genetic tests.  Estrogen and progesterone therapy: yes. She reports that her menstrual period stopped at early 73s for at least a year and then she started hormone replacement for a few years and stopped.  Abdominal hysterotomy.  History of radiation to chest: denies.   INTERVAL HISTORY Denise Reyes is a 52 y.o. female who has above history reviewed by me today presents for follow up visit for management of DCIS Patient's case was discussed at breast tumor board on 02/11/2018. Recommendation is anti estrogen treatment and adjuvant RT.  Patient presents to discuss consensus recommendation.    Review of Systems  Constitutional: Negative for chills, fever, malaise/fatigue and weight  loss.  HENT: Negative for nosebleeds and sore throat.   Eyes: Negative for double vision, photophobia and redness.  Respiratory: Negative for cough, shortness of breath and wheezing.   Cardiovascular: Negative for chest pain, palpitations and orthopnea.  Gastrointestinal: Negative for abdominal pain, blood in stool, nausea and vomiting.  Genitourinary: Negative for dysuria.  Musculoskeletal: Negative for back pain, myalgias and neck pain.  Skin: Negative for itching and rash.  Neurological: Negative for dizziness, tingling and tremors.  Endo/Heme/Allergies: Negative for environmental allergies. Does not bruise/bleed easily.  Psychiatric/Behavioral: Negative for depression.    MEDICAL HISTORY:  Past Medical History:  Diagnosis Date  . Allergy    PT CURRENTLY TAKING ABX AND PREDNISONE GIVEN BY DR Tami Ribas ON 01-16-18 FOR INFECTION THAT SHE GETS THIS TIME EVERY YEAR  . Anxiety   . Arthritis    knees, wrists  . Asthma    allergy induced   . Cancer ([place])   . GERD (gastroesophageal reflux disease)    occ-no meds  . History of kidney stones    h/o  . Problems with swallowing and mastication   . Special screening for malignant neoplasms, colon   . Wears contact lenses     SURGICAL HISTORY: Past Surgical History:  Procedure Laterality Date  . ABDOMINAL HYSTERECTOMY    . BREAST BIOPSY Left    DUCTAL CARCINOMA IN SITU (DCIS), LOW NUCLEAR GRADE.  Marland Kitchen BREAST BIOPSY Left 01/23/2018   Procedure: BREAST BIOPSY/PARTIAL MASTECTOMY WITH NEEDLE LOCALIZATION;  Surgeon: Florene Glen, MD;  Location: [facility];  Service: General;  Laterality: Left;  . BREAST EXCISIONAL BIOPSY Left 01/23/2018   lumectomy with NL   . COLONOSCOPY WITH PROPOFOL N/A 07/07/2016   Procedure: COLONOSCOPY WITH  PROPOFOL;  Surgeon: Lucilla Lame, MD;  Location: [place];  Service: Endoscopy;  Laterality: N/A;  . ESOPHAGOGASTRODUODENOSCOPY (EGD) WITH PROPOFOL N/A 07/07/2016   Procedure: ESOPHAGOGASTRODUODENOSCOPY  (EGD) WITH PROPOFOL;  Surgeon: Lucilla Lame, MD;  Location: [place];  Service: Endoscopy;  Laterality: N/A;  . URETHRA SURGERY     72 months old    SOCIAL HISTORY: Social History   Socioeconomic History  . Marital status: Single    Spouse name: Not on file  . Number of children: Not on file  . Years of education: Not on file  . Highest education level: Not on file  Occupational History  . Not on file  Social Needs  . Financial resource strain: Not on file  . Food insecurity:    Worry: Not on file    Inability: Not on file  . Transportation needs:    Medical: Not on file    Non-medical: Not on file  Tobacco Use  . Smoking status: Current Every Day Smoker    Packs/day: 1.00    Years: 35.00    Pack years: 35.00    Types: Cigarettes  . Smokeless tobacco: Never Used  Substance and Sexual Activity  . Alcohol use: Yes    Alcohol/week: 1.2 oz    Types: 2 Cans of beer per week    Comment: occ weekend beers  . Drug use: No  . Sexual activity: Not on file  Lifestyle  . Physical activity:    Days per week: Not on file    Minutes per session: Not on file  . Stress: Not on file  Relationships  . Social connections:    Talks on phone: Not on file    Gets together: Not on file    Attends religious service: Not on file    Active member of club or organization: Not on file    Attends meetings of clubs or organizations: Not on file    Relationship status: Not on file  . Intimate partner violence:    Fear of current or ex partner: Not on file    Emotionally abused: Not on file    Physically abused: Not on file    Forced sexual activity: Not on file  Other Topics Concern  . Not on file  Social History Narrative  . Not on file    FAMILY HISTORY: Family History  Problem Relation Age of Onset  . Hypertension Mother   . High Cholesterol Mother   . Hypertension Father   . Heart disease Father   . COPD Father   . High Cholesterol Father   . Heart disease  Paternal Grandfather   . Cancer Sister        ovarian  . Cancer Maternal Aunt   . Cancer Paternal Grandmother 34       breast    ALLERGIES:  has No Known Allergies.  MEDICATIONS:  Current Outpatient Medications  Medication Sig Dispense Refill  . albuterol (PROVENTIL HFA;VENTOLIN HFA) 108 (90 Base) MCG/ACT inhaler Inhale 2 puffs into the lungs 2 (two) times daily as needed for wheezing or shortness of breath.   4  . atorvastatin (LIPITOR) 20 MG tablet Take 20 mg by mouth every evening.    . cefdinir (OMNICEF) 300 MG capsule Take 300 mg by mouth 2 (two) times daily. Started 01/16/2018 - 14 day course    . cetirizine-pseudoephedrine (ZYRTEC-D) 5-120 MG tablet Take 1 tablet by mouth 2 (two) times daily.    . vitamin C (ASCORBIC  ACID) 500 MG tablet Take 500 mg by mouth daily.    Marland Kitchen ALPRAZolam (XANAX) 0.5 MG tablet Take 0.5 mg by mouth 3 (three) times daily as needed for anxiety.    Marland Kitchen loratadine (CLARITIN) 10 MG tablet Take 10 mg by mouth daily.    . predniSONE (STERAPRED UNI-PAK 21 TAB) 10 MG (21) TBPK tablet Take 10 mg by mouth See admin instructions. Prednisone 6 day taper     No current facility-administered medications for this visit.      PHYSICAL EXAMINATION: ECOG PERFORMANCE STATUS: 0 - Asymptomatic Vitals:   02/12/18 1352  BP: 108/73  Pulse: (!) 103  Resp: 18  Temp: 98.2 F (36.8 C)   Filed Weights   02/12/18 1352  Weight: 133 lb (60.3 kg)    Physical Exam  Constitutional: She is oriented to person, place, and time. She appears well-developed and well-nourished. No distress.  HENT:  Head: Normocephalic and atraumatic.  Right Ear: External ear normal.  Left Ear: External ear normal.  Mouth/Throat: Oropharynx is clear and moist.  Eyes: Pupils are equal, round, and reactive to light. Conjunctivae and EOM are normal. No scleral icterus.  Neck: Normal range of motion. Neck supple.  Cardiovascular: Normal rate, regular rhythm and normal heart sounds.  Pulmonary/Chest:  Effort normal and breath sounds normal. No respiratory distress. She has no wheezes. She has no rales. She exhibits no tenderness.  Abdominal: Soft. Bowel sounds are normal. She exhibits no distension and no mass. There is no tenderness.  Musculoskeletal: Normal range of motion. She exhibits no edema or deformity.  Lymphadenopathy:    She has no cervical adenopathy.  Neurological: She is alert and oriented to person, place, and time. No cranial nerve deficit. Coordination normal.  Skin: Skin is warm and dry. No rash noted.  Psychiatric: She has a normal mood and affect. Her behavior is normal. Thought content normal.  Breast exam was performed in seated and lying down position. Patient is status post left  lumpectomy with a well-healed surgical scar.  No palpable axillary adenopathy. No evidence of any palpable masses or lumps in the right breast. No evidence of right axillary adenopathy    LABORATORY DATA:  I have reviewed the data as listed Lab Results  Component Value Date   WBC 6.1 02/12/2018   HGB 14.1 02/12/2018   HCT 39.5 02/12/2018   MCV 92.7 02/12/2018   PLT 318 02/12/2018   Recent Labs    01/21/18 0854 02/12/18 1338  NA 136 136  K 3.4* 4.0  CL 102 105  CO2 25 23  GLUCOSE 83 94  BUN 13 9  CREATININE 0.64 0.59  CALCIUM 8.8* 9.0  GFRNONAA >60 >60  GFRAA >60 >60  PROT  --  6.7  ALBUMIN  --  3.8  AST  --  23  ALT  --  26  ALKPHOS  --  57  BILITOT  --  0.9   Iron/TIBC/Ferritin/ %Sat No results found for: IRON, TIBC, FERRITIN, IRONPCTSAT   RADIOGRAPHIC STUDIES: I have personally reviewed the radiological images as listed and agreed with the findings in the report. 12/11/2017  1. Benign RIGHT breast calcifications. 2. Indeterminate calcifications in the UPPER OUTER QUADRANT of the LEFT breast for which biopsy is indicated. 3. No sonographic or mammographic abnormality in the 4 o'clock location area of clinical concern. ASSESSMENT & PLAN:  1. Ductal carcinoma in  situ (DCIS) of left breast    Pathology result were discussed with patient. Explained to patient that  DCIS is a non invasive breast cancer.  Discussed with patient that although her DCIS foci is very small, her breast has very proliferative background, consistent with hormone replacement treatment.  She has stopped hormone replacement treatment about 1 month ago.   Report that she has had bilaterally oophectomy and hystectomy. Only Hystectomy was listed in her past surgeries.  Will obtain Gyn records.    # chest menopausal state. FSH, LH, estradiol.   Orders Placed This Encounter  Procedures  . Ambulatory referral to Radiation Oncology    Referral Priority:   Routine    Referral Type:   Consultation    Referral Reason:   Specialty Services Required    Referred to Provider:   Noreene Filbert, MD    Requested Specialty:   Radiation Oncology    Number of Visits Requested:   1    All questions were answered. The patient knows to call the clinic with any problems questions or concerns. Cc Dr.Cooper  Addendum, I have obtained patient's GYN operation report from Dr. Louann Liv office. Reviewed GYN operation report and pathology. Patient has had bilateral salpingo-oophorectomy and hysterectomy on 12/30/2013. Pathology reviewed.  Showed benign essentially unremarkable cervix, benign proliferative endometrium associated with benign endometrial polyp.  Adenomyosis, leiomyomata, benign right ovary with small foci of endometriosis, benign right paratubal cyst essentially unremarkable left ovary and the fallopian tube. Above reports will be scanned to epic's.  Patient is postmenopausal.  Will need aromatase inhibitor for 5 years.  Plan to start after patient complete adjuvant radiation.  Return of visit: 1 week Thank you for this kind referral and the opportunity to participate in the care of this patient. A copy of today's note is routed to referring provider  Total face to face encounter  time for this patient visit was 60 min. >50% of the time was  spent in counseling and coordination of care.    Earlie Server, MD, PhD Hematology Oncology [facility] Pager- [phone] 02/13/2018

## 2018-02-15 ENCOUNTER — Encounter: Payer: Self-pay | Admitting: Oncology

## 2018-02-22 ENCOUNTER — Encounter: Payer: Self-pay | Admitting: Radiation Oncology

## 2018-02-22 ENCOUNTER — Ambulatory Visit
Admission: RE | Admit: 2018-02-22 | Discharge: 2018-02-22 | Disposition: A | Discharge status: Home / Self Care | Payer: Managed Care, Other (non HMO) | Source: Ambulatory Visit | Attending: Radiation Oncology | Admitting: Radiation Oncology

## 2018-02-22 ENCOUNTER — Other Ambulatory Visit: Payer: Self-pay

## 2018-02-22 VITALS — BP 109/76 | HR 95 | Temp 98.0°F | Resp 18 | Ht 59.5 in | Wt 134.3 lb

## 2018-02-22 DIAGNOSIS — Z79899 Other long term (current) drug therapy: Secondary | ICD-10-CM | POA: Diagnosis not present

## 2018-02-22 DIAGNOSIS — Z809 Family history of malignant neoplasm, unspecified: Secondary | ICD-10-CM | POA: Diagnosis not present

## 2018-02-22 DIAGNOSIS — Z87442 Personal history of urinary calculi: Secondary | ICD-10-CM | POA: Insufficient documentation

## 2018-02-22 DIAGNOSIS — D0512 Intraductal carcinoma in situ of left breast: Secondary | ICD-10-CM | POA: Diagnosis not present

## 2018-02-22 DIAGNOSIS — J45909 Unspecified asthma, uncomplicated: Secondary | ICD-10-CM | POA: Insufficient documentation

## 2018-02-22 DIAGNOSIS — K219 Gastro-esophageal reflux disease without esophagitis: Secondary | ICD-10-CM | POA: Diagnosis not present

## 2018-02-22 DIAGNOSIS — Z9889 Other specified postprocedural states: Secondary | ICD-10-CM | POA: Diagnosis not present

## 2018-02-22 DIAGNOSIS — Z170 Estrogen receptor positive status [ER+]: Secondary | ICD-10-CM | POA: Insufficient documentation

## 2018-02-22 DIAGNOSIS — F419 Anxiety disorder, unspecified: Secondary | ICD-10-CM | POA: Insufficient documentation

## 2018-02-22 DIAGNOSIS — F17210 Nicotine dependence, cigarettes, uncomplicated: Secondary | ICD-10-CM | POA: Insufficient documentation

## 2018-02-22 NOTE — Consult Note (Signed)
NEW PATIENT EVALUATION  Name: Denise Reyes  MRN: [id number]  Date:   02/22/2018     DOB: Mar 29, 1966   This 52 y.o. 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 Breast Cancer    Pt is here for initial consultation of breast cancer.     DIAGNOSIS: The encounter diagnosis was Ductal carcinoma in situ (DCIS) of left breast.   PREVIOUS INVESTIGATIONS:  Mammogram and ultrasound reviewed Pathology reports reviewed Clinical notes reviewed  HPI: patient is a 52 year old female who presented with an abnormal mammogram of her left breast showing architectural distortion and breast calcifications at the 4:00 position. This was palpated by her gynecologist.ight breast also had consultations although there were determined to be benign. In the upper outer quadrant of the left breast she underwent biopsy which was positive for low-grade ductal carcinoma in situ.the original mammogram showed calcifications spanning approxi-7 mm.tumor was ER/PR positive.patient underwent a wide local excision showing no evidence of ductal carcinoma in situ and negative for atypia. She is seen today for evaluation.surgical specimen mammogram showed residual calcifications noted within the center of the specimen. Patient is doing well. She specifically denies breast tenderness cough or bone pain.  PLANNED TREATMENT REGIMEN: hypofractionated whole breast radiation  PAST MEDICAL HISTORY:  has a past medical history of Allergy, Anxiety, Arthritis, Asthma, Cancer ([place]), GERD (gastroesophageal reflux disease), History of kidney stones, Problems with swallowing and mastication, Special screening for malignant neoplasms, colon, and Wears contact lenses.    PAST SURGICAL HISTORY:  Past Surgical History:   Procedure Laterality Date  . BILATERAL SALPINGOOPHORECTOMY Bilateral 12/30/2013  . BREAST BIOPSY Left    DUCTAL CARCINOMA IN SITU (DCIS), LOW NUCLEAR GRADE.  Marland Kitchen BREAST BIOPSY Left 01/23/2018   Procedure: BREAST BIOPSY/PARTIAL MASTECTOMY WITH NEEDLE LOCALIZATION;  Surgeon: Florene Glen, MD;  Location: [facility];  Service: General;  Laterality: Left;  . BREAST EXCISIONAL BIOPSY Left 01/23/2018   lumectomy with NL   . COLONOSCOPY WITH PROPOFOL N/A 07/07/2016   Procedure: COLONOSCOPY WITH PROPOFOL;  Surgeon: Lucilla Lame, MD;  Location: [place];  Service: Endoscopy;  Laterality: N/A;  . ESOPHAGOGASTRODUODENOSCOPY (EGD) WITH PROPOFOL N/A 07/07/2016   Procedure: ESOPHAGOGASTRODUODENOSCOPY (EGD) WITH PROPOFOL;  Surgeon: Lucilla Lame, MD;  Location: [place];  Service: Endoscopy;  Laterality: N/A;  . LAPAROSCOPIC ASSISTED VAGINAL HYSTERECTOMY  12/30/2013  . URETHRA SURGERY     55 months old    FAMILY HISTORY: family history includes COPD in her father; Cancer in her maternal aunt and sister; Cancer (age of onset: 69) in her paternal grandmother; Heart disease in her father and paternal grandfather; High Cholesterol in her father and mother; Hypertension in her father and mother.  SOCIAL HISTORY:  reports that she has been smoking cigarettes.  She has a 35.00 pack-year smoking history. She has never used smokeless tobacco. She reports that she drinks about 1.2 oz of alcohol per week. She reports that she does not use drugs.  ALLERGIES: Patient has no known allergies.  MEDICATIONS:  Current Outpatient Medications  Medication Sig Dispense Refill  . albuterol (PROVENTIL HFA;VENTOLIN HFA) 108 (90 Base) MCG/ACT inhaler Inhale 2 puffs into the lungs 2 (two) times daily as needed for wheezing or shortness of breath.   4  . ALPRAZolam (XANAX) 0.5  MG tablet Take 0.5 mg by mouth 3 (three) times daily as needed for anxiety.    Marland Kitchen atorvastatin (LIPITOR) 20 MG tablet Take 20 mg by mouth  every evening.    . cetirizine-pseudoephedrine (ZYRTEC-D) 5-120 MG tablet Take 1 tablet by mouth 2 (two) times daily as needed.     . montelukast (SINGULAIR) 10 MG tablet TK 1 T PO D FOR ALLERGIES  2  . VIRTUSSIN A/C 100-10 MG/5ML syrup TK 5 ML PO Q 4-6 H PRN FOR COUGH  0  . vitamin C (ASCORBIC ACID) 500 MG tablet Take 500 mg by mouth daily.     No current facility-administered medications for this encounter.     ECOG PERFORMANCE STATUS:  0 - Asymptomatic  REVIEW OF SYSTEMS:  Patient denies any weight loss, fatigue, weakness, fever, chills or night sweats. Patient denies any loss of vision, blurred vision. Patient denies any ringing  of the ears or hearing loss. No irregular heartbeat. Patient denies heart murmur or history of fainting. Patient denies any chest pain or pain radiating to her upper extremities. Patient denies any shortness of breath, difficulty breathing at night, cough or hemoptysis. Patient denies any swelling in the lower legs. Patient denies any nausea vomiting, vomiting of blood, or coffee ground material in the vomitus. Patient denies any stomach pain. Patient states has had normal bowel movements no significant constipation or diarrhea. Patient denies any dysuria, hematuria or significant nocturia. Patient denies any problems walking, swelling in the joints or loss of balance. Patient denies any skin changes, loss of hair or loss of weight. Patient denies any excessive worrying or anxiety or significant depression. Patient denies any problems with insomnia. Patient denies excessive thirst, polyuria, polydipsia. Patient denies any swollen glands, patient denies easy bruising or easy bleeding. Patient denies any recent infections, allergies or URI. Patient "s visual fields have not changed significantly in recent time.    PHYSICAL EXAM: BP 109/76   Pulse 95   Temp 98 F (36.7 C)   Resp 18   Ht 4' 11.5" (1.511 m)   Wt 134 lb 4.2 oz (60.9 kg)   BMI 26.66 kg/m  Patient is  status post wide local excision the left breast. This is healing well no dominant mass or nodularity is noted in either breast in 2 positions examined. No axillary or supraclavicular adenopathy is identified.Well-developed well-nourished patient in NAD. HEENT reveals PERLA, EOMI, discs not visualized.  Oral cavity is clear. No oral mucosal lesions are identified. Neck is clear without evidence of cervical or supraclavicular adenopathy. Lungs are clear to A&P. Cardiac examination is essentially unremarkable with regular rate and rhythm without murmur rub or thrill. Abdomen is benign with no organomegaly or masses noted. Motor sensory and DTR levels are equal and symmetric in the upper and lower extremities. Cranial nerves II through XII are grossly intact. Proprioception is intact. No peripheral adenopathy or edema is identified. No motor or sensory levels are noted. Crude visual fields are within normal range.  LABORATORY DATA: pathology reports reviewed    RADIOLOGY RESULTS:mammogram and ultrasound reviewed   IMPRESSION: ow-grade ductal carcinoma in situ of the left breast as was wide local excision in 52 year old female tumor is ER/PR positive  PLAN: this time I to go with whole breast radiation therapy. I believe we can do hypofractionated course over 4 weeks. Would also boost area scar to 1600 cGybased on no residual DCIS noted in thewide local excision specimen. Risks and benefits of treatment including skin reaction fatigue  alteration of blood counts possible inclusion of superficial lung all were discussed in detail with the patient. I have personally set up and ordered CT simulation for next week. Patient also will be candidate for antiestrogen therapy after completion of radiation. Patient and her sister both seem to comprehend my treatment plan well.  I would like to take this opportunity to thank you for allowing me to participate in the care of your patient.Noreene Filbert,  MD

## 2018-02-26 ENCOUNTER — Ambulatory Visit
Admission: RE | Admit: 2018-02-26 | Discharge: 2018-02-26 | Disposition: A | Discharge status: Home / Self Care | Payer: Managed Care, Other (non HMO) | Source: Ambulatory Visit | Attending: Radiation Oncology | Admitting: Radiation Oncology

## 2018-02-26 DIAGNOSIS — Z510 Encounter for antineoplastic radiation therapy: Secondary | ICD-10-CM | POA: Diagnosis not present

## 2018-02-26 DIAGNOSIS — D0512 Intraductal carcinoma in situ of left breast: Secondary | ICD-10-CM | POA: Diagnosis not present

## 2018-02-27 DIAGNOSIS — Z510 Encounter for antineoplastic radiation therapy: Secondary | ICD-10-CM | POA: Diagnosis not present

## 2018-03-01 ENCOUNTER — Other Ambulatory Visit: Payer: Self-pay | Admitting: *Deleted

## 2018-03-01 DIAGNOSIS — D0512 Intraductal carcinoma in situ of left breast: Secondary | ICD-10-CM

## 2018-03-07 ENCOUNTER — Ambulatory Visit
Admission: RE | Admit: 2018-03-07 | Discharge: 2018-03-07 | Disposition: A | Discharge status: Home / Self Care | Payer: Managed Care, Other (non HMO) | Source: Ambulatory Visit | Attending: Radiation Oncology | Admitting: Radiation Oncology

## 2018-03-07 DIAGNOSIS — Z170 Estrogen receptor positive status [ER+]: Secondary | ICD-10-CM | POA: Insufficient documentation

## 2018-03-07 DIAGNOSIS — D0512 Intraductal carcinoma in situ of left breast: Secondary | ICD-10-CM | POA: Diagnosis not present

## 2018-03-07 DIAGNOSIS — Z510 Encounter for antineoplastic radiation therapy: Secondary | ICD-10-CM | POA: Diagnosis not present

## 2018-03-11 ENCOUNTER — Ambulatory Visit
Admission: RE | Admit: 2018-03-11 | Discharge: 2018-03-11 | Disposition: A | Discharge status: Home / Self Care | Payer: Managed Care, Other (non HMO) | Source: Ambulatory Visit | Attending: Radiation Oncology | Admitting: Radiation Oncology

## 2018-03-11 DIAGNOSIS — Z510 Encounter for antineoplastic radiation therapy: Secondary | ICD-10-CM | POA: Diagnosis not present

## 2018-03-12 ENCOUNTER — Ambulatory Visit
Admission: RE | Admit: 2018-03-12 | Discharge: 2018-03-12 | Disposition: A | Discharge status: Home / Self Care | Payer: Managed Care, Other (non HMO) | Source: Ambulatory Visit | Attending: Radiation Oncology | Admitting: Radiation Oncology

## 2018-03-12 DIAGNOSIS — Z510 Encounter for antineoplastic radiation therapy: Secondary | ICD-10-CM | POA: Diagnosis not present

## 2018-03-13 ENCOUNTER — Ambulatory Visit
Admission: RE | Admit: 2018-03-13 | Discharge: 2018-03-13 | Disposition: A | Discharge status: Home / Self Care | Payer: Managed Care, Other (non HMO) | Source: Ambulatory Visit | Attending: Radiation Oncology | Admitting: Radiation Oncology

## 2018-03-13 DIAGNOSIS — Z510 Encounter for antineoplastic radiation therapy: Secondary | ICD-10-CM | POA: Diagnosis not present

## 2018-03-14 ENCOUNTER — Ambulatory Visit
Admission: RE | Admit: 2018-03-14 | Discharge: 2018-03-14 | Disposition: A | Discharge status: Home / Self Care | Payer: Managed Care, Other (non HMO) | Source: Ambulatory Visit | Attending: Radiation Oncology | Admitting: Radiation Oncology

## 2018-03-14 DIAGNOSIS — Z510 Encounter for antineoplastic radiation therapy: Secondary | ICD-10-CM | POA: Diagnosis not present

## 2018-03-15 ENCOUNTER — Ambulatory Visit
Admission: RE | Admit: 2018-03-15 | Discharge: 2018-03-15 | Disposition: A | Discharge status: Home / Self Care | Payer: Managed Care, Other (non HMO) | Source: Ambulatory Visit | Attending: Radiation Oncology | Admitting: Radiation Oncology

## 2018-03-15 DIAGNOSIS — Z510 Encounter for antineoplastic radiation therapy: Secondary | ICD-10-CM | POA: Diagnosis not present

## 2018-03-17 ENCOUNTER — Ambulatory Visit: Admission: RE | Admit: 2018-03-17 | Payer: Managed Care, Other (non HMO) | Source: Ambulatory Visit

## 2018-03-18 ENCOUNTER — Ambulatory Visit: Payer: Managed Care, Other (non HMO)

## 2018-03-18 ENCOUNTER — Ambulatory Visit
Admission: RE | Admit: 2018-03-18 | Discharge: 2018-03-18 | Disposition: A | Discharge status: Home / Self Care | Payer: Managed Care, Other (non HMO) | Source: Ambulatory Visit | Attending: Radiation Oncology | Admitting: Radiation Oncology

## 2018-03-18 DIAGNOSIS — Z510 Encounter for antineoplastic radiation therapy: Secondary | ICD-10-CM | POA: Diagnosis not present

## 2018-03-19 ENCOUNTER — Ambulatory Visit
Admission: RE | Admit: 2018-03-19 | Discharge: 2018-03-19 | Disposition: A | Discharge status: Home / Self Care | Payer: Managed Care, Other (non HMO) | Source: Ambulatory Visit | Attending: Radiation Oncology | Admitting: Radiation Oncology

## 2018-03-19 ENCOUNTER — Other Ambulatory Visit: Payer: Self-pay | Admitting: *Deleted

## 2018-03-19 DIAGNOSIS — Z510 Encounter for antineoplastic radiation therapy: Secondary | ICD-10-CM | POA: Diagnosis not present

## 2018-03-20 ENCOUNTER — Ambulatory Visit
Admission: RE | Admit: 2018-03-20 | Discharge: 2018-03-20 | Disposition: A | Discharge status: Home / Self Care | Payer: Managed Care, Other (non HMO) | Source: Ambulatory Visit | Attending: Radiation Oncology | Admitting: Radiation Oncology

## 2018-03-20 DIAGNOSIS — Z510 Encounter for antineoplastic radiation therapy: Secondary | ICD-10-CM | POA: Diagnosis not present

## 2018-03-21 ENCOUNTER — Ambulatory Visit
Admission: RE | Admit: 2018-03-21 | Discharge: 2018-03-21 | Disposition: A | Discharge status: Home / Self Care | Payer: Managed Care, Other (non HMO) | Source: Ambulatory Visit | Attending: Radiation Oncology | Admitting: Radiation Oncology

## 2018-03-21 DIAGNOSIS — Z510 Encounter for antineoplastic radiation therapy: Secondary | ICD-10-CM | POA: Diagnosis not present

## 2018-03-22 ENCOUNTER — Ambulatory Visit
Admission: RE | Admit: 2018-03-22 | Discharge: 2018-03-22 | Disposition: A | Discharge status: Home / Self Care | Payer: Managed Care, Other (non HMO) | Source: Ambulatory Visit | Attending: Radiation Oncology | Admitting: Radiation Oncology

## 2018-03-22 DIAGNOSIS — Z510 Encounter for antineoplastic radiation therapy: Secondary | ICD-10-CM | POA: Diagnosis not present

## 2018-03-25 ENCOUNTER — Ambulatory Visit
Admission: RE | Admit: 2018-03-25 | Discharge: 2018-03-25 | Disposition: A | Discharge status: Home / Self Care | Payer: Managed Care, Other (non HMO) | Source: Ambulatory Visit | Attending: Radiation Oncology | Admitting: Radiation Oncology

## 2018-03-25 DIAGNOSIS — Z510 Encounter for antineoplastic radiation therapy: Secondary | ICD-10-CM | POA: Diagnosis not present

## 2018-03-26 ENCOUNTER — Ambulatory Visit: Payer: Managed Care, Other (non HMO) | Admitting: Oncology

## 2018-03-26 ENCOUNTER — Inpatient Hospital Stay: Payer: Managed Care, Other (non HMO) | Attending: Radiation Oncology

## 2018-03-26 ENCOUNTER — Ambulatory Visit
Admission: RE | Admit: 2018-03-26 | Discharge: 2018-03-26 | Disposition: A | Discharge status: Home / Self Care | Payer: Managed Care, Other (non HMO) | Source: Ambulatory Visit | Attending: Radiation Oncology | Admitting: Radiation Oncology

## 2018-03-26 ENCOUNTER — Other Ambulatory Visit: Payer: Managed Care, Other (non HMO)

## 2018-03-26 DIAGNOSIS — D0512 Intraductal carcinoma in situ of left breast: Secondary | ICD-10-CM

## 2018-03-26 DIAGNOSIS — Z510 Encounter for antineoplastic radiation therapy: Secondary | ICD-10-CM | POA: Diagnosis not present

## 2018-03-26 LAB — CBC
HCT: 38.3 % (ref 35.0–47.0)
Hemoglobin: 13.4 g/dL (ref 12.0–16.0)
MCH: 32.7 pg (ref 26.0–34.0)
MCHC: 35 g/dL (ref 32.0–36.0)
MCV: 93.5 fL (ref 80.0–100.0)
PLATELETS: 326 10*3/uL (ref 150–440)
RBC: 4.09 MIL/uL (ref 3.80–5.20)
RDW: 12.8 % (ref 11.5–14.5)
WBC: 5.8 10*3/uL (ref 3.6–11.0)

## 2018-03-27 ENCOUNTER — Ambulatory Visit
Admission: RE | Admit: 2018-03-27 | Discharge: 2018-03-27 | Disposition: A | Discharge status: Home / Self Care | Payer: Managed Care, Other (non HMO) | Source: Ambulatory Visit | Attending: Radiation Oncology | Admitting: Radiation Oncology

## 2018-03-27 DIAGNOSIS — Z510 Encounter for antineoplastic radiation therapy: Secondary | ICD-10-CM | POA: Diagnosis not present

## 2018-03-28 ENCOUNTER — Ambulatory Visit
Admission: RE | Admit: 2018-03-28 | Discharge: 2018-03-28 | Disposition: A | Discharge status: Home / Self Care | Payer: Managed Care, Other (non HMO) | Source: Ambulatory Visit | Attending: Radiation Oncology | Admitting: Radiation Oncology

## 2018-03-28 DIAGNOSIS — Z510 Encounter for antineoplastic radiation therapy: Secondary | ICD-10-CM | POA: Diagnosis not present

## 2018-03-29 ENCOUNTER — Ambulatory Visit
Admission: RE | Admit: 2018-03-29 | Discharge: 2018-03-29 | Disposition: A | Discharge status: Home / Self Care | Payer: Managed Care, Other (non HMO) | Source: Ambulatory Visit | Attending: Radiation Oncology | Admitting: Radiation Oncology

## 2018-03-29 DIAGNOSIS — Z510 Encounter for antineoplastic radiation therapy: Secondary | ICD-10-CM | POA: Diagnosis not present

## 2018-04-02 ENCOUNTER — Ambulatory Visit
Admission: RE | Admit: 2018-04-02 | Discharge: 2018-04-02 | Disposition: A | Discharge status: Home / Self Care | Payer: Managed Care, Other (non HMO) | Source: Ambulatory Visit | Attending: Radiation Oncology | Admitting: Radiation Oncology

## 2018-04-02 DIAGNOSIS — Z170 Estrogen receptor positive status [ER+]: Secondary | ICD-10-CM | POA: Diagnosis not present

## 2018-04-02 DIAGNOSIS — Z510 Encounter for antineoplastic radiation therapy: Secondary | ICD-10-CM | POA: Insufficient documentation

## 2018-04-02 DIAGNOSIS — D0512 Intraductal carcinoma in situ of left breast: Secondary | ICD-10-CM | POA: Insufficient documentation

## 2018-04-03 ENCOUNTER — Ambulatory Visit
Admission: RE | Admit: 2018-04-03 | Discharge: 2018-04-03 | Disposition: A | Discharge status: Home / Self Care | Payer: Managed Care, Other (non HMO) | Source: Ambulatory Visit | Attending: Radiation Oncology | Admitting: Radiation Oncology

## 2018-04-03 DIAGNOSIS — Z510 Encounter for antineoplastic radiation therapy: Secondary | ICD-10-CM | POA: Diagnosis not present

## 2018-04-04 ENCOUNTER — Ambulatory Visit
Admission: RE | Admit: 2018-04-04 | Discharge: 2018-04-04 | Disposition: A | Discharge status: Home / Self Care | Payer: Managed Care, Other (non HMO) | Source: Ambulatory Visit | Attending: Radiation Oncology | Admitting: Radiation Oncology

## 2018-04-04 DIAGNOSIS — Z510 Encounter for antineoplastic radiation therapy: Secondary | ICD-10-CM | POA: Diagnosis not present

## 2018-04-05 ENCOUNTER — Ambulatory Visit
Admission: RE | Admit: 2018-04-05 | Discharge: 2018-04-05 | Disposition: A | Discharge status: Home / Self Care | Payer: Managed Care, Other (non HMO) | Source: Ambulatory Visit | Attending: Radiation Oncology | Admitting: Radiation Oncology

## 2018-04-05 DIAGNOSIS — Z510 Encounter for antineoplastic radiation therapy: Secondary | ICD-10-CM | POA: Diagnosis not present

## 2018-04-08 ENCOUNTER — Ambulatory Visit: Payer: Managed Care, Other (non HMO)

## 2018-04-09 ENCOUNTER — Ambulatory Visit
Admission: RE | Admit: 2018-04-09 | Discharge: 2018-04-09 | Disposition: A | Discharge status: Home / Self Care | Payer: Managed Care, Other (non HMO) | Source: Ambulatory Visit | Attending: Radiation Oncology | Admitting: Radiation Oncology

## 2018-04-09 ENCOUNTER — Inpatient Hospital Stay: Payer: 59 | Attending: Radiation Oncology

## 2018-04-09 DIAGNOSIS — Z510 Encounter for antineoplastic radiation therapy: Secondary | ICD-10-CM | POA: Diagnosis not present

## 2018-04-09 DIAGNOSIS — F17210 Nicotine dependence, cigarettes, uncomplicated: Secondary | ICD-10-CM | POA: Diagnosis not present

## 2018-04-09 DIAGNOSIS — Z79899 Other long term (current) drug therapy: Secondary | ICD-10-CM | POA: Insufficient documentation

## 2018-04-09 DIAGNOSIS — Z923 Personal history of irradiation: Secondary | ICD-10-CM | POA: Diagnosis not present

## 2018-04-09 DIAGNOSIS — M8580 Other specified disorders of bone density and structure, unspecified site: Secondary | ICD-10-CM | POA: Insufficient documentation

## 2018-04-09 DIAGNOSIS — D0512 Intraductal carcinoma in situ of left breast: Secondary | ICD-10-CM | POA: Insufficient documentation

## 2018-04-09 LAB — CBC
HEMATOCRIT: 39.6 % (ref 35.0–47.0)
Hemoglobin: 13.7 g/dL (ref 12.0–16.0)
MCH: 32.6 pg (ref 26.0–34.0)
MCHC: 34.6 g/dL (ref 32.0–36.0)
MCV: 94.1 fL (ref 80.0–100.0)
Platelets: 306 10*3/uL (ref 150–440)
RBC: 4.2 MIL/uL (ref 3.80–5.20)
RDW: 12.9 % (ref 11.5–14.5)
WBC: 6.7 10*3/uL (ref 3.6–11.0)

## 2018-04-10 ENCOUNTER — Ambulatory Visit
Admission: RE | Admit: 2018-04-10 | Discharge: 2018-04-10 | Disposition: A | Discharge status: Home / Self Care | Payer: Managed Care, Other (non HMO) | Source: Ambulatory Visit | Attending: Radiation Oncology | Admitting: Radiation Oncology

## 2018-04-10 DIAGNOSIS — Z510 Encounter for antineoplastic radiation therapy: Secondary | ICD-10-CM | POA: Diagnosis not present

## 2018-04-11 ENCOUNTER — Ambulatory Visit
Admission: RE | Admit: 2018-04-11 | Discharge: 2018-04-11 | Disposition: A | Discharge status: Home / Self Care | Payer: Managed Care, Other (non HMO) | Source: Ambulatory Visit | Attending: Radiation Oncology | Admitting: Radiation Oncology

## 2018-04-11 DIAGNOSIS — Z510 Encounter for antineoplastic radiation therapy: Secondary | ICD-10-CM | POA: Diagnosis not present

## 2018-04-12 ENCOUNTER — Ambulatory Visit: Payer: Managed Care, Other (non HMO)

## 2018-04-12 ENCOUNTER — Ambulatory Visit
Admission: RE | Admit: 2018-04-12 | Discharge: 2018-04-12 | Disposition: A | Discharge status: Home / Self Care | Payer: Managed Care, Other (non HMO) | Source: Ambulatory Visit | Attending: Radiation Oncology | Admitting: Radiation Oncology

## 2018-04-12 DIAGNOSIS — Z510 Encounter for antineoplastic radiation therapy: Secondary | ICD-10-CM | POA: Diagnosis not present

## 2018-04-15 ENCOUNTER — Ambulatory Visit
Admission: RE | Admit: 2018-04-15 | Discharge: 2018-04-15 | Disposition: A | Discharge status: Home / Self Care | Payer: Managed Care, Other (non HMO) | Source: Ambulatory Visit | Attending: Radiation Oncology | Admitting: Radiation Oncology

## 2018-04-15 ENCOUNTER — Other Ambulatory Visit: Payer: Self-pay | Admitting: *Deleted

## 2018-04-15 DIAGNOSIS — Z510 Encounter for antineoplastic radiation therapy: Secondary | ICD-10-CM | POA: Diagnosis not present

## 2018-04-15 DIAGNOSIS — D0512 Intraductal carcinoma in situ of left breast: Secondary | ICD-10-CM

## 2018-04-16 ENCOUNTER — Encounter: Payer: Self-pay | Admitting: Oncology

## 2018-04-16 ENCOUNTER — Other Ambulatory Visit: Payer: Self-pay

## 2018-04-16 ENCOUNTER — Inpatient Hospital Stay: Payer: 59

## 2018-04-16 ENCOUNTER — Inpatient Hospital Stay (HOSPITAL_BASED_OUTPATIENT_CLINIC_OR_DEPARTMENT_OTHER): Payer: 59 | Admitting: Oncology

## 2018-04-16 VITALS — BP 106/71 | HR 100 | Temp 97.7°F | Resp 18 | Wt 137.5 lb

## 2018-04-16 DIAGNOSIS — M8580 Other specified disorders of bone density and structure, unspecified site: Secondary | ICD-10-CM

## 2018-04-16 DIAGNOSIS — Z79899 Other long term (current) drug therapy: Secondary | ICD-10-CM

## 2018-04-16 DIAGNOSIS — M810 Age-related osteoporosis without current pathological fracture: Secondary | ICD-10-CM

## 2018-04-16 DIAGNOSIS — F17210 Nicotine dependence, cigarettes, uncomplicated: Secondary | ICD-10-CM | POA: Diagnosis not present

## 2018-04-16 DIAGNOSIS — D0512 Intraductal carcinoma in situ of left breast: Secondary | ICD-10-CM

## 2018-04-16 DIAGNOSIS — Z780 Asymptomatic menopausal state: Secondary | ICD-10-CM

## 2018-04-16 DIAGNOSIS — Z923 Personal history of irradiation: Secondary | ICD-10-CM

## 2018-04-16 LAB — COMPREHENSIVE METABOLIC PANEL
ALBUMIN: 4.1 g/dL (ref 3.5–5.0)
ALK PHOS: 44 U/L (ref 38–126)
ALT: 21 U/L (ref 0–44)
AST: 24 U/L (ref 15–41)
Anion gap: 8 (ref 5–15)
BUN: 10 mg/dL (ref 6–20)
CALCIUM: 9.5 mg/dL (ref 8.9–10.3)
CO2: 27 mmol/L (ref 22–32)
Chloride: 106 mmol/L (ref 98–111)
Creatinine, Ser: 0.59 mg/dL (ref 0.44–1.00)
GFR calc non Af Amer: >60 mL/min (ref >60)
GLUCOSE: 78 mg/dL (ref 70–99)
Potassium: 4.5 mmol/L (ref 3.5–5.1)
SODIUM: 141 mmol/L (ref 135–145)
Total Bilirubin: 0.9 mg/dL (ref 0.3–1.2)
Total Protein: 6.6 g/dL (ref 6.5–8.1)

## 2018-04-16 LAB — CBC WITH DIFFERENTIAL/PLATELET
BASOS PCT: 1 %
Basophils Absolute: 0.1 10*3/uL (ref 0–0.1)
EOS ABS: 0.1 10*3/uL (ref 0–0.7)
Eosinophils Relative: 2 %
HCT: 38.6 % (ref 35.0–47.0)
Hemoglobin: 13.5 g/dL (ref 12.0–16.0)
Lymphocytes Relative: 26 %
Lymphs Abs: 1.3 10*3/uL (ref 1.0–3.6)
MCH: 32.8 pg (ref 26.0–34.0)
MCHC: 35.1 g/dL (ref 32.0–36.0)
MCV: 93.6 fL (ref 80.0–100.0)
MONO ABS: 0.5 10*3/uL (ref 0.2–0.9)
MONOS PCT: 9 %
Neutro Abs: 3.1 10*3/uL (ref 1.4–6.5)
Neutrophils Relative %: 62 %
Platelets: 298 10*3/uL (ref 150–440)
RBC: 4.13 MIL/uL (ref 3.80–5.20)
RDW: 12.7 % (ref 11.5–14.5)
WBC: 5 10*3/uL (ref 3.6–11.0)

## 2018-04-16 MED ORDER — LETROZOLE 2.5 MG PO TABS: 2.5000 mg | ORAL_TABLET | Freq: Every day | ORAL | 6 refills | Status: DC

## 2018-04-16 NOTE — Progress Notes (Signed)
Patient here for follow up. No concerns voiced.  °

## 2018-04-16 NOTE — Progress Notes (Signed)
Hematology/Oncology follow up  note [facility] [phone] [phone]   Patient Care Team: Marguerita Merles, MD as PCP - General (Family Medicine)  REFERRING PROVIDER: Dr.Cooper. REASON FOR VISIT Follow up for treatment of :  Evaluation of DCIS  HISTORY OF PRESENTING ILLNESS:  Denise Reyes is a  52 y.o.  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   Patient had a mammogram done in May 2019.  Mammogram showed indeterminate calcifications in the upper outer quadrant of the left breast for which biopsy is indicated.  Patient is status post stereotactic guided core biopsy of the left breast calcifications. 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   Patient underwent left breast lumpectomy on 01/23/2018, pathology showed fibrocystic change with microcalcifications Patient was referred to oncology for further evaluation and management.   Nipple discharge: Denies Family history: breast cancer in paternal grandmother at age of 75. Sister has Ovarian cancer. Per patient, her sister has negative genetic tests.  Estrogen and progesterone therapy: yes. She reports that her menstrual period stopped at early 36s for at least a year and then she started hormone replacement for a few years and stopped.  Abdominal hysterotomy.  History of radiation to chest: denies.   # Patient's case was discussed at breast tumor board on 02/11/2018. patient that although her DCIS foci is very small, her breast has very proliferative background, consistent with hormone replacement treatment.  Recommendation is anti estrogen treatment and adjuvant RT.  INTERVAL HISTORY Denise Reyes is a 52 y.o. female who has above history reviewed by me today presents for mangement of DCIS.  During  the interval, She has finished adjuvant RT.  Reports tolerating well. She has mild skin reaction, manageable.   # I have obtained patient's GYN operation report from Dr. Louann Liv office. Patient is in menopasue state as she had  Patient has had bilateral salpingo-oophorectomy and hysterectomy on 12/30/2013. Pathology reviewed.  Showed benign essentially unremarkable cervix, benign proliferative endometrium associated with benign endometrial polyp.  Adenomyosis, leiomyomata, benign right ovary with small foci of endometriosis, benign right paratubal cyst essentially unremarkable left ovary and the fallopian tube.  # No new complaints today.   Review of Systems  Constitutional: Negative for chills, fever, malaise/fatigue and weight loss.  HENT: Negative for nosebleeds and sore throat.   Eyes: Negative for double vision, photophobia and redness.  Respiratory: Negative for cough, shortness of breath and wheezing.   Cardiovascular: Negative for chest pain, palpitations and orthopnea.  Gastrointestinal: Negative for abdominal pain, blood in stool, nausea and vomiting.  Genitourinary: Negative for dysuria.  Musculoskeletal: Negative for back pain, myalgias and neck pain.  Skin: Negative for itching and rash.  Neurological: Negative for dizziness, tingling and tremors.  Endo/Heme/Allergies: Negative for environmental allergies. Does not bruise/bleed easily.  Psychiatric/Behavioral: Negative for depression.    MEDICAL HISTORY:  Past Medical History:  Diagnosis Date  . Allergy    PT CURRENTLY TAKING ABX AND PREDNISONE GIVEN BY DR Tami Ribas ON 01-16-18 FOR INFECTION THAT SHE GETS THIS TIME EVERY YEAR  . Anxiety   . Arthritis    knees, wrists  . Asthma    allergy induced   . Cancer ([place])   . GERD (gastroesophageal reflux disease)    occ-no meds  . History of kidney stones    h/o  .  Problems with swallowing and mastication   . Special screening for malignant neoplasms, colon   . Wears  contact lenses     SURGICAL HISTORY: Past Surgical History:  Procedure Laterality Date  . BILATERAL SALPINGOOPHORECTOMY Bilateral 12/30/2013  . BREAST BIOPSY Left    DUCTAL CARCINOMA IN SITU (DCIS), LOW NUCLEAR GRADE.  Marland Kitchen BREAST BIOPSY Left 01/23/2018   Procedure: BREAST BIOPSY/PARTIAL MASTECTOMY WITH NEEDLE LOCALIZATION;  Surgeon: Florene Glen, MD;  Location: [facility];  Service: General;  Laterality: Left;  . BREAST EXCISIONAL BIOPSY Left 01/23/2018   lumectomy with NL   . COLONOSCOPY WITH PROPOFOL N/A 07/07/2016   Procedure: COLONOSCOPY WITH PROPOFOL;  Surgeon: Lucilla Lame, MD;  Location: [place];  Service: Endoscopy;  Laterality: N/A;  . ESOPHAGOGASTRODUODENOSCOPY (EGD) WITH PROPOFOL N/A 07/07/2016   Procedure: ESOPHAGOGASTRODUODENOSCOPY (EGD) WITH PROPOFOL;  Surgeon: Lucilla Lame, MD;  Location: [place];  Service: Endoscopy;  Laterality: N/A;  . LAPAROSCOPIC ASSISTED VAGINAL HYSTERECTOMY  12/30/2013  . URETHRA SURGERY     62 months old    SOCIAL HISTORY: Social History   Socioeconomic History  . Marital status: Single    Spouse name: Not on file  . Number of children: Not on file  . Years of education: Not on file  . Highest education level: Not on file  Occupational History  . Not on file  Social Needs  . Financial resource strain: Not on file  . Food insecurity:    Worry: Not on file    Inability: Not on file  . Transportation needs:    Medical: Not on file    Non-medical: Not on file  Tobacco Use  . Smoking status: Current Every Day Smoker    Packs/day: 1.00    Years: 35.00    Pack years: 35.00    Types: Cigarettes  . Smokeless tobacco: Never Used  Substance and Sexual Activity  . Alcohol use: Yes    Alcohol/week: 2.0 standard drinks    Types: 2 Cans of beer per week    Comment: occ weekend beers  . Drug use: No  . Sexual activity: Not on file  Lifestyle  . Physical activity:    Days per week: Not on file    Minutes per session:  Not on file  . Stress: Not on file  Relationships  . Social connections:    Talks on phone: Not on file    Gets together: Not on file    Attends religious service: Not on file    Active member of club or organization: Not on file    Attends meetings of clubs or organizations: Not on file    Relationship status: Not on file  . Intimate partner violence:    Fear of current or ex partner: Not on file    Emotionally abused: Not on file    Physically abused: Not on file    Forced sexual activity: Not on file  Other Topics Concern  . Not on file  Social History Narrative  . Not on file    FAMILY HISTORY: Family History  Problem Relation Age of Onset  . Hypertension Mother   . High Cholesterol Mother   . Hypertension Father   . Heart disease Father   . COPD Father   . High Cholesterol Father   . Heart disease Paternal Grandfather   . Cancer Sister        ovarian  . Cancer Maternal Aunt   . Cancer Paternal Grandmother 34  breast    ALLERGIES:  has No Known Allergies.  MEDICATIONS:  Current Outpatient Medications  Medication Sig Dispense Refill  . albuterol (PROVENTIL HFA;VENTOLIN HFA) 108 (90 Base) MCG/ACT inhaler Inhale 2 puffs into the lungs 2 (two) times daily as needed for wheezing or shortness of breath.   4  . ALPRAZolam (XANAX) 0.5 MG tablet Take 0.5 mg by mouth 3 (three) times daily as needed for anxiety.    Marland Kitchen atorvastatin (LIPITOR) 20 MG tablet Take 20 mg by mouth every evening.    . calcium-vitamin D (OSCAL WITH D) 500-200 MG-UNIT tablet Take 1 tablet by mouth daily with breakfast.    . cetirizine-pseudoephedrine (ZYRTEC-D) 5-120 MG tablet Take 1 tablet by mouth 2 (two) times daily as needed.     . cyanocobalamin 1000 MCG tablet Take 1,000 mcg by mouth daily.    . montelukast (SINGULAIR) 10 MG tablet TK 1 T PO D FOR ALLERGIES  2  . Omega-3 Fatty Acids (OMEGA-3 FISH OIL PO) Take by mouth daily.    Marland Kitchen VIRTUSSIN A/C 100-10 MG/5ML syrup TK 5 ML PO Q 4-6 H PRN FOR  COUGH  0  . vitamin C (ASCORBIC ACID) 500 MG tablet Take 500 mg by mouth daily.    Marland Kitchen letrozole (FEMARA) 2.5 MG tablet Take 1 tablet (2.5 mg total) by mouth daily. 30 tablet 6   No current facility-administered medications for this visit.      PHYSICAL EXAMINATION: ECOG PERFORMANCE STATUS: 0 - Asymptomatic Vitals:   04/16/18 0945  BP: 106/71  Pulse: 100  Resp: 18  Temp: 97.7 F (36.5 C)  SpO2: 100%   Filed Weights   04/16/18 0945  Weight: 137 lb 8 oz (62.4 kg)    Physical Exam  Constitutional: She is oriented to person, place, and time. She appears well-developed and well-nourished. No distress.  HENT:  Head: Normocephalic and atraumatic.  Mouth/Throat: Oropharynx is clear and moist.  Eyes: Pupils are equal, round, and reactive to light. Conjunctivae and EOM are normal. No scleral icterus.  Neck: Normal range of motion. Neck supple.  Cardiovascular: Normal rate, regular rhythm and normal heart sounds.  Pulmonary/Chest: Effort normal and breath sounds normal. No respiratory distress. She has no wheezes. She has no rales. She exhibits no tenderness.  Abdominal: Soft. Bowel sounds are normal. She exhibits no distension and no mass. There is no tenderness.  Musculoskeletal: Normal range of motion. She exhibits no edema or deformity.  Lymphadenopathy:    She has no cervical adenopathy.  Neurological: She is alert and oriented to person, place, and time. No cranial nerve deficit. Coordination normal.  Skin: Skin is warm and dry. No rash noted. No erythema.  Psychiatric: She has a normal mood and affect. Her behavior is normal. Thought content normal.      LABORATORY DATA:  I have reviewed the data as listed Lab Results  Component Value Date   WBC 5.0 04/16/2018   HGB 13.5 04/16/2018   HCT 38.6 04/16/2018   MCV 93.6 04/16/2018   PLT 298 04/16/2018   Recent Labs    01/21/18 0854 02/12/18 1338 04/16/18 0924  NA 136 136 141  K 3.4* 4.0 4.5  CL 102 105 106  CO2 25 23  27   GLUCOSE 83 94 78  BUN 13 9 10   CREATININE 0.64 0.59 0.59  CALCIUM 8.8* 9.0 9.5  GFRNONAA >60 >60 >60  GFRAA >60 >60 >60  PROT  --  6.7 6.6  ALBUMIN  --  3.8 4.1  AST  --  23 24  ALT  --  26 21  ALKPHOS  --  57 44  BILITOT  --  0.9 0.9   Iron/TIBC/Ferritin/ %Sat No results found for: IRON, TIBC, FERRITIN, IRONPCTSAT   RADIOGRAPHIC STUDIES: I have personally reviewed the radiological images as listed and agreed with the findings in the report. 12/11/2017  1. Benign RIGHT breast calcifications. 2. Indeterminate calcifications in the UPPER OUTER QUADRANT of the LEFT breast for which biopsy is indicated. 3. No sonographic or mammographic abnormality in the 4 o'clock location area of clinical concern. ASSESSMENT & PLAN:  1. Ductal carcinoma in situ (DCIS) of left breast   2. Osteopenia after menopause    # DCIS s/p lumpectomy and adjuvant RT.  Recommend starting adjuvant aromatase inhibitor letrozole 2.5mg  for 5 years.  Rationale and side effects of Letrozole discussed with patient. She agrees with the plan.   # Osteopenia.  DEXA was done through GYN office. We obtained her records from GYN office and reviewed. DEXA done in May 2019 showed left femoral neck T score -1.1, AP spine -0.3, right femoral neck -1.1 Recommend dental clearance for bisphosphonate treatment.   Orders Placed This Encounter  Procedures  . CBC with Differential/Platelet    Standing Status:   Future    Standing Expiration Date:   04/17/2019  . Comprehensive metabolic panel    Standing Status:   Future    Standing Expiration Date:   04/17/2019    All questions were answered. The patient knows to call the clinic with any problems questions or concerns.  Return of visit: 4 weeks. Total face to face encounter time for this patient visit was 25 min. >50% of the time was  spent in counseling and coordination of care.   Earlie Server, MD, PhD Hematology Oncology [facility] Pager- [phone] 04/16/2018

## 2018-04-17 ENCOUNTER — Encounter: Payer: Self-pay | Admitting: Oncology

## 2018-05-10 ENCOUNTER — Encounter: Payer: Self-pay | Admitting: Oncology

## 2018-05-13 ENCOUNTER — Other Ambulatory Visit: Payer: Self-pay | Admitting: *Deleted

## 2018-05-13 DIAGNOSIS — D0512 Intraductal carcinoma in situ of left breast: Secondary | ICD-10-CM

## 2018-05-13 DIAGNOSIS — Z79811 Long term (current) use of aromatase inhibitors: Secondary | ICD-10-CM

## 2018-05-16 ENCOUNTER — Ambulatory Visit: Payer: Managed Care, Other (non HMO) | Admitting: Radiation Oncology

## 2018-05-16 ENCOUNTER — Inpatient Hospital Stay: Payer: Managed Care, Other (non HMO)

## 2018-05-16 ENCOUNTER — Inpatient Hospital Stay: Payer: Managed Care, Other (non HMO) | Admitting: Oncology

## 2018-05-16 ENCOUNTER — Other Ambulatory Visit: Payer: Managed Care, Other (non HMO)

## 2018-05-17 ENCOUNTER — Other Ambulatory Visit: Payer: Self-pay

## 2018-05-17 ENCOUNTER — Ambulatory Visit: Payer: Managed Care, Other (non HMO) | Admitting: Oncology

## 2018-05-17 ENCOUNTER — Inpatient Hospital Stay: Payer: 59 | Attending: Oncology

## 2018-05-17 ENCOUNTER — Encounter: Payer: Self-pay | Admitting: Oncology

## 2018-05-17 ENCOUNTER — Inpatient Hospital Stay (HOSPITAL_BASED_OUTPATIENT_CLINIC_OR_DEPARTMENT_OTHER): Payer: 59 | Admitting: Oncology

## 2018-05-17 ENCOUNTER — Ambulatory Visit
Admission: RE | Admit: 2018-05-17 | Discharge: 2018-05-17 | Disposition: A | Discharge status: Home / Self Care | Payer: 59 | Source: Ambulatory Visit | Attending: Radiation Oncology | Admitting: Radiation Oncology

## 2018-05-17 VITALS — BP 119/75 | HR 97 | Temp 97.0°F | Resp 18 | Wt 136.2 lb

## 2018-05-17 DIAGNOSIS — Z923 Personal history of irradiation: Secondary | ICD-10-CM | POA: Diagnosis not present

## 2018-05-17 DIAGNOSIS — M2550 Pain in unspecified joint: Secondary | ICD-10-CM | POA: Diagnosis not present

## 2018-05-17 DIAGNOSIS — Z79811 Long term (current) use of aromatase inhibitors: Secondary | ICD-10-CM

## 2018-05-17 DIAGNOSIS — Z170 Estrogen receptor positive status [ER+]: Secondary | ICD-10-CM | POA: Insufficient documentation

## 2018-05-17 DIAGNOSIS — D0512 Intraductal carcinoma in situ of left breast: Secondary | ICD-10-CM | POA: Insufficient documentation

## 2018-05-17 DIAGNOSIS — Z780 Asymptomatic menopausal state: Secondary | ICD-10-CM

## 2018-05-17 DIAGNOSIS — M810 Age-related osteoporosis without current pathological fracture: Secondary | ICD-10-CM

## 2018-05-17 LAB — CBC WITH DIFFERENTIAL/PLATELET
ABS IMMATURE GRANULOCYTES: 0.01 10*3/uL (ref 0.00–0.07)
BASOS ABS: 0.1 10*3/uL (ref 0.0–0.1)
BASOS PCT: 1 %
Eosinophils Absolute: 0.1 10*3/uL (ref 0.0–0.5)
Eosinophils Relative: 2 %
HEMATOCRIT: 39.9 % (ref 36.0–46.0)
HEMOGLOBIN: 13.6 g/dL (ref 12.0–15.0)
Immature Granulocytes: 0 %
LYMPHS ABS: 1.4 10*3/uL (ref 0.7–4.0)
Lymphocytes Relative: 30 %
MCH: 31.4 pg (ref 26.0–34.0)
MCHC: 34.1 g/dL (ref 30.0–36.0)
MCV: 92.1 fL (ref 80.0–100.0)
MONOS PCT: 10 %
Monocytes Absolute: 0.5 10*3/uL (ref 0.1–1.0)
NEUTROS PCT: 57 %
Neutro Abs: 2.7 10*3/uL (ref 1.7–7.7)
Platelets: 272 10*3/uL (ref 150–400)
RBC: 4.33 MIL/uL (ref 3.87–5.11)
RDW: 11.7 % (ref 11.5–15.5)
WBC: 4.8 10*3/uL (ref 4.0–10.5)
nRBC: 0 % (ref 0.0–0.2)

## 2018-05-17 LAB — COMPREHENSIVE METABOLIC PANEL
ALBUMIN: 4 g/dL (ref 3.5–5.0)
ALT: 21 U/L (ref 0–44)
ANION GAP: 7 (ref 5–15)
AST: 22 U/L (ref 15–41)
Alkaline Phosphatase: 46 U/L (ref 38–126)
BUN: 8 mg/dL (ref 6–20)
CO2: 27 mmol/L (ref 22–32)
Calcium: 9.9 mg/dL (ref 8.9–10.3)
Chloride: 104 mmol/L (ref 98–111)
Creatinine, Ser: 0.65 mg/dL (ref 0.44–1.00)
GFR calc Af Amer: >60 mL/min (ref >60)
GFR calc non Af Amer: >60 mL/min (ref >60)
GLUCOSE: 98 mg/dL (ref 70–99)
POTASSIUM: 4.5 mmol/L (ref 3.5–5.1)
SODIUM: 138 mmol/L (ref 135–145)
Total Bilirubin: 0.8 mg/dL (ref 0.3–1.2)
Total Protein: 6.5 g/dL (ref 6.5–8.1)

## 2018-05-17 LAB — LIPID PANEL
CHOL/HDL RATIO: 2.4 ratio
Cholesterol: 185 mg/dL (ref 0–200)
HDL: 77 mg/dL (ref >40)
LDL Cholesterol: 100 mg/dL — ABNORMAL HIGH (ref 0–99)
Triglycerides: 39 mg/dL (ref <150)
VLDL: 8 mg/dL (ref 0–40)

## 2018-05-17 NOTE — Progress Notes (Signed)
Radiation Oncology Follow up Note  Name: Denise Reyes   Date:   05/17/2018 MRN:  [id number] DOB: [birth year]    This 52 y.o. female presents to the clinic today for one-month follow-up status post whole breast radiation to her left breast first ductal carcinoma in situ ER/PR positive.  REFERRING PROVIDER: Marguerita Merles, MD  HPI: patient is a 52 year old female now out 1 month having completed whole breast radiation to her left breast for ER/PR positive ductal carcinoma in situ. Seen today in routine follow-up she is doing well. She specifically denies breast tenderness cough or bone pain..she's been started on letrozole time that well without side effect.  COMPLICATIONS OF TREATMENT: none  FOLLOW UP COMPLIANCE: keeps appointments   PHYSICAL EXAM:  There were no vitals taken for this visit. Lungs are clear to A&P cardiac examination essentially unremarkable with regular rate and rhythm. No dominant mass or nodularity is noted in either breast in 2 positions examined. Incision is well-healed. No axillary or supraclavicular adenopathy is appreciated. Cosmetic result is excellent.Well-developed well-nourished patient in NAD. HEENT reveals PERLA, EOMI, discs not visualized.  Oral cavity is clear. No oral mucosal lesions are identified. Neck is clear without evidence of cervical or supraclavicular adenopathy. Lungs are clear to A&P. Cardiac examination is essentially unremarkable with regular rate and rhythm without murmur rub or thrill. Abdomen is benign with no organomegaly or masses noted. Motor sensory and DTR levels are equal and symmetric in the upper and lower extremities. Cranial nerves II through XII are grossly intact. Proprioception is intact. No peripheral adenopathy or edema is identified. No motor or sensory levels are noted. Crude visual fields are within normal range.  RADIOLOGY RESULTS: no current films for review  PLAN: present time patient is recovering well from her radiation  therapy treatments. I'm please were overall progress she has excellent cosmetic result. She continues on letrozole without side effect. I have asked to see her back in 3-4 months for follow-up. I am switching her to Dr. Tollie Pizza for follow-up since Dr. Burt Knack has left his practice. Patient is to call with any concerns at any time.  I would like to take this opportunity to thank you for allowing me to participate in the care of your patient.Noreene Filbert, MD

## 2018-05-17 NOTE — Progress Notes (Addendum)
Hematology/Oncology follow up  note [facility] [phone] [phone]   Patient Care Team: Marguerita Merles, MD as PCP - General (Family Medicine)  REFERRING PROVIDER: Dr.Cooper. REASON FOR VISIT Follow up for treatment of :  DCIS  HISTORY OF PRESENTING ILLNESS:  Denise Reyes is a  52 y.o.  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   Patient had a mammogram done in May 2019.  Mammogram showed indeterminate calcifications in the upper outer quadrant of the left breast for which biopsy is indicated.  Patient is status post stereotactic guided core biopsy of the left breast calcifications. 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   Patient underwent left breast lumpectomy on 01/23/2018, pathology showed fibrocystic change with microcalcifications Patient was referred to oncology for further evaluation and management.   # Patient's case was discussed at breast tumor board on 02/11/2018. patient that although her DCIS foci is very small, her breast has very proliferative background, consistent with hormone replacement treatment.  Status post adjuvant RT completed in September 2019. Letrozole was started in September 2019   # Postmenopausal status: obtained patient's GYN operation report from Dr. Louann Liv office. Patient is in menopasue state as she had  Patient has had bilateral salpingo-oophorectomy and hysterectomy on 12/30/2013. Pathology reviewed.  Showed benign essentially unremarkable cervix, benign proliferative endometrium associated with benign endometrial polyp.  Adenomyosis, leiomyomata, benign right ovary with small foci of endometriosis, benign right paratubal cyst essentially unremarkable left ovary and the fallopian tube.  Marland Kitchen  #We  obtained her records from GYN office and reviewed. DEXA done in May 2019 showed left femoral neck T score -1.1, AP spine -0.3, right femoral neck -1.1   INTERVAL HISTORY Denise Reyes is a 52 y.o. female who has above history reviewed by me today presents for management of DCIS.  Patient reports having some joint stiffness and hot flash after started on letrozole.  Otherwise doing well.  Denies any bone pain.   Review of Systems  Constitutional: Negative for chills, fever, malaise/fatigue and weight loss.  HENT: Negative for nosebleeds and sore throat.   Eyes: Negative for double vision, photophobia and redness.  Respiratory: Negative for cough, shortness of breath and wheezing.   Cardiovascular: Negative for chest pain, palpitations and orthopnea.  Gastrointestinal: Negative for abdominal pain, blood in stool, nausea and vomiting.  Genitourinary: Negative for dysuria.  Musculoskeletal: Negative for back pain, myalgias and neck pain.  Skin: Negative for itching and rash.  Neurological: Negative for dizziness, tingling and tremors.  Endo/Heme/Allergies: Negative for environmental allergies. Does not bruise/bleed easily.  Psychiatric/Behavioral: Negative for depression.    MEDICAL HISTORY:  Past Medical History:  Diagnosis Date  . Allergy    PT CURRENTLY TAKING ABX AND PREDNISONE GIVEN BY DR Tami Ribas ON 01-16-18 FOR INFECTION THAT SHE GETS THIS TIME EVERY YEAR  . Anxiety   . Arthritis    knees, wrists  . Asthma    allergy induced   . Cancer ([place])   . GERD (gastroesophageal reflux disease)    occ-no meds  . History of kidney stones    h/o  . Problems with swallowing and mastication   . Special screening for malignant neoplasms, colon   . Wears contact lenses     SURGICAL HISTORY: Past Surgical History:  Procedure Laterality Date  .  BILATERAL SALPINGOOPHORECTOMY Bilateral 12/30/2013  . BREAST BIOPSY Left    DUCTAL CARCINOMA IN SITU (DCIS), LOW NUCLEAR GRADE.  Marland Kitchen BREAST  BIOPSY Left 01/23/2018   Procedure: BREAST BIOPSY/PARTIAL MASTECTOMY WITH NEEDLE LOCALIZATION;  Surgeon: Florene Glen, MD;  Location: [facility];  Service: General;  Laterality: Left;  . BREAST EXCISIONAL BIOPSY Left 01/23/2018   lumectomy with NL   . COLONOSCOPY WITH PROPOFOL N/A 07/07/2016   Procedure: COLONOSCOPY WITH PROPOFOL;  Surgeon: Lucilla Lame, MD;  Location: [place];  Service: Endoscopy;  Laterality: N/A;  . ESOPHAGOGASTRODUODENOSCOPY (EGD) WITH PROPOFOL N/A 07/07/2016   Procedure: ESOPHAGOGASTRODUODENOSCOPY (EGD) WITH PROPOFOL;  Surgeon: Lucilla Lame, MD;  Location: [place];  Service: Endoscopy;  Laterality: N/A;  . LAPAROSCOPIC ASSISTED VAGINAL HYSTERECTOMY  12/30/2013  . URETHRA SURGERY     89 months old    SOCIAL HISTORY: Social History   Socioeconomic History  . Marital status: Single    Spouse name: Not on file  . Number of children: Not on file  . Years of education: Not on file  . Highest education level: Not on file  Occupational History  . Not on file  Social Needs  . Financial resource strain: Not on file  . Food insecurity:    Worry: Not on file    Inability: Not on file  . Transportation needs:    Medical: Not on file    Non-medical: Not on file  Tobacco Use  . Smoking status: Current Every Day Smoker    Packs/day: 1.00    Years: 35.00    Pack years: 35.00    Types: Cigarettes  . Smokeless tobacco: Never Used  Substance and Sexual Activity  . Alcohol use: Yes    Alcohol/week: 2.0 standard drinks    Types: 2 Cans of beer per week    Comment: occ weekend beers  . Drug use: No  . Sexual activity: Not on file  Lifestyle  . Physical activity:    Days per week: Not on file    Minutes per session: Not on file  . Stress: Not on file  Relationships  . Social connections:    Talks on phone: Not on file    Gets together: Not on file    Attends religious service: Not on file    Active member of club or organization: Not on file     Attends meetings of clubs or organizations: Not on file    Relationship status: Not on file  . Intimate partner violence:    Fear of current or ex partner: Not on file    Emotionally abused: Not on file    Physically abused: Not on file    Forced sexual activity: Not on file  Other Topics Concern  . Not on file  Social History Narrative  . Not on file    FAMILY HISTORY: Family History  Problem Relation Age of Onset  . Hypertension Mother   . High Cholesterol Mother   . Hypertension Father   . Heart disease Father   . COPD Father   . High Cholesterol Father   . Heart disease Paternal Grandfather   . Cancer Sister        ovarian  . Cancer Maternal Aunt   . Cancer Paternal Grandmother 31       breast    ALLERGIES:  has No Known Allergies.  MEDICATIONS:  Current Outpatient Medications  Medication Sig Dispense Refill  . albuterol (PROVENTIL HFA;VENTOLIN HFA) 108 (90 Base) MCG/ACT  inhaler Inhale 2 puffs into the lungs 2 (two) times daily as needed for wheezing or shortness of breath.   4  . ALPRAZolam (XANAX) 0.5 MG tablet Take 0.5 mg by mouth 3 (three) times daily as needed for anxiety.    Marland Kitchen atorvastatin (LIPITOR) 20 MG tablet Take 20 mg by mouth every evening.    . calcium-vitamin D (OSCAL WITH D) 500-200 MG-UNIT tablet Take 1 tablet by mouth daily with breakfast.    . cetirizine-pseudoephedrine (ZYRTEC-D) 5-120 MG tablet Take 1 tablet by mouth 2 (two) times daily as needed.     . cyanocobalamin 1000 MCG tablet Take 1,000 mcg by mouth daily.    Marland Kitchen letrozole (FEMARA) 2.5 MG tablet Take 1 tablet (2.5 mg total) by mouth daily. 30 tablet 6  . montelukast (SINGULAIR) 10 MG tablet TK 1 T PO D FOR ALLERGIES  2  . Omega-3 Fatty Acids (OMEGA-3 FISH OIL PO) Take by mouth daily.    Marland Kitchen VIRTUSSIN A/C 100-10 MG/5ML syrup TK 5 ML PO Q 4-6 H PRN FOR COUGH  0  . vitamin C (ASCORBIC ACID) 500 MG tablet Take 500 mg by mouth daily.     No current facility-administered medications for this  visit.      PHYSICAL EXAMINATION: ECOG PERFORMANCE STATUS: 0 - Asymptomatic Vitals:   05/17/18 0853  BP: 119/75  Pulse: 97  Resp: 18  Temp: (!) 97 F (36.1 C)   Filed Weights   05/17/18 0853  Weight: 136 lb 3.2 oz (61.8 kg)    Physical Exam  Constitutional: She is oriented to person, place, and time. No distress.  HENT:  Reyes: Normocephalic and atraumatic.  Mouth/Throat: Oropharynx is clear and moist.  Eyes: Pupils are equal, round, and reactive to light. Conjunctivae and EOM are normal. No scleral icterus.  Neck: Normal range of motion. Neck supple.  Cardiovascular: Normal rate, regular rhythm and normal heart sounds.  Pulmonary/Chest: Effort normal and breath sounds normal. No respiratory distress. She has no wheezes. She has no rales. She exhibits no tenderness.  Abdominal: Soft. Bowel sounds are normal. She exhibits no distension and no mass. There is no tenderness.  Musculoskeletal: Normal range of motion. She exhibits no edema or deformity.  Lymphadenopathy:    She has no cervical adenopathy.  Neurological: She is alert and oriented to person, place, and time. No cranial nerve deficit. Coordination normal.  Skin: Skin is warm and dry. No rash noted. No erythema.  Psychiatric: She has a normal mood and affect. Her behavior is normal. Thought content normal.      LABORATORY DATA:  I have reviewed the data as listed Lab Results  Component Value Date   WBC 4.8 05/17/2018   HGB 13.6 05/17/2018   HCT 39.9 05/17/2018   MCV 92.1 05/17/2018   PLT 272 05/17/2018   Recent Labs    02/12/18 1338 04/16/18 0924 05/17/18 0839  NA 136 141 138  K 4.0 4.5 4.5  CL 105 106 104  CO2 23 27 27   GLUCOSE 94 78 98  BUN 9 10 8   CREATININE 0.59 0.59 0.65  CALCIUM 9.0 9.5 9.9  GFRNONAA >60 >60 >60  GFRAA >60 >60 >60  PROT 6.7 6.6 6.5  ALBUMIN 3.8 4.1 4.0  AST 23 24 22   ALT 26 21 21   ALKPHOS 57 44 46  BILITOT 0.9 0.9 0.8   Iron/TIBC/Ferritin/ %Sat No results found for:  IRON, TIBC, FERRITIN, IRONPCTSAT   RADIOGRAPHIC STUDIES: I have personally reviewed the radiological images  as listed and agreed with the findings in the report. 12/11/2017  1. Benign RIGHT breast calcifications. 2. Indeterminate calcifications in the UPPER OUTER QUADRANT of the LEFT breast for which biopsy is indicated. 3. No sonographic or mammographic abnormality in the 4 o'clock location area of clinical concern. ASSESSMENT & PLAN:  1. Ductal carcinoma in situ (DCIS) of left breast   2. Aromatase inhibitor use   3. Osteopenia after menopause    # DCIS s/p lumpectomy and adjuvant RT.  Tolerate aromatase inhibitor.  Continue for 5 years.  Recommend annual diagnostic mammogram bilaterally.   # Osteopenia, will obtain dental clearance for proceeding with prolia.  Recommend starting adjuvant aromatase inhibitor letrozole 2.5mg  for 5 years.  Rationale and side effects of Letrozole discussed with patient. She agrees with the plan.  Advise patient to take calcium and vitamin D supplement.   Orders Placed This Encounter  Procedures  . CBC with Differential/Platelet    Standing Status:   Future    Standing Expiration Date:   05/17/2019  . Comprehensive metabolic panel    Standing Status:   Future    Standing Expiration Date:   05/17/2019    All questions were answered. The patient knows to call the clinic with any problems questions or concerns.  Return of visit:  14 weeks.   Earlie Server, MD, PhD Hematology Oncology [facility] at [facility] Pager- [phone] 05/17/2018

## 2018-05-17 NOTE — Progress Notes (Signed)
Patient here for follow up. She has noticed stiffness in her joints since she has started letrozole.

## 2018-05-21 ENCOUNTER — Other Ambulatory Visit: Payer: Self-pay

## 2018-05-21 DIAGNOSIS — D0512 Intraductal carcinoma in situ of left breast: Secondary | ICD-10-CM

## 2018-05-27 ENCOUNTER — Encounter: Payer: Self-pay | Admitting: Oncology

## 2018-05-28 ENCOUNTER — Other Ambulatory Visit: Payer: Self-pay | Admitting: Oncology

## 2018-05-28 DIAGNOSIS — M8580 Other specified disorders of bone density and structure, unspecified site: Secondary | ICD-10-CM | POA: Insufficient documentation

## 2018-06-21 ENCOUNTER — Other Ambulatory Visit: Payer: Self-pay | Admitting: Oncology

## 2018-06-23 ENCOUNTER — Encounter: Payer: Self-pay | Admitting: Oncology

## 2018-07-04 ENCOUNTER — Other Ambulatory Visit: Payer: 59

## 2018-07-10 ENCOUNTER — Ambulatory Visit: Payer: Managed Care, Other (non HMO) | Admitting: Surgery

## 2018-07-25 ENCOUNTER — Ambulatory Visit
Admission: RE | Admit: 2018-07-25 | Discharge: 2018-07-25 | Disposition: A | Discharge status: Home / Self Care | Payer: 59 | Source: Ambulatory Visit | Attending: Surgery | Admitting: Surgery

## 2018-07-25 DIAGNOSIS — D0512 Intraductal carcinoma in situ of left breast: Secondary | ICD-10-CM

## 2018-07-25 HISTORY — DX: Personal history of irradiation: Z92.3

## 2018-08-07 ENCOUNTER — Ambulatory Visit (INDEPENDENT_AMBULATORY_CARE_PROVIDER_SITE_OTHER): Payer: 59 | Admitting: Surgery

## 2018-08-07 ENCOUNTER — Other Ambulatory Visit: Payer: Self-pay

## 2018-08-07 ENCOUNTER — Encounter: Payer: Self-pay | Admitting: Surgery

## 2018-08-07 VITALS — BP 112/78 | HR 102 | Temp 97.9°F | Resp 16 | Ht 59.0 in | Wt 137.2 lb

## 2018-08-07 DIAGNOSIS — D0512 Intraductal carcinoma in situ of left breast: Secondary | ICD-10-CM

## 2018-08-07 NOTE — Patient Instructions (Signed)
We will schedule the Mammogram and breast exam in July. We will call you in June to schedule this. If you do not hear from our office the end of June please call our office.   You can try Vitamin E to help soften the scar.     Breast Self-Awareness Breast self-awareness means:  Knowing how your breasts look.  Knowing how your breasts feel.  Checking your breasts every month for changes.  Telling your doctor if you notice a change in your breasts. Breast self-awareness allows you to notice a breast problem early while it is still small. How to do a breast self-exam One way to learn what is normal for your breasts and to check for changes is to do a breast self-exam. To do a breast self-exam: Look for Changes  1. Take off all the clothes above your waist. 2. Stand in front of a mirror in a room with good lighting. 3. Put your hands on your hips. 4. Push your hands down. 5. Look at your breasts and nipples in the mirror to see if one breast or nipple looks different than the other. Check to see if: ? The shape of one breast is different. ? The size of one breast is different. ? There are wrinkles, dips, and bumps in one breast and not the other. 6. Look at each breast for changes in your skin, such as: ? Redness. ? Scaly areas. 7. Look for changes in your nipples, such as: ? Liquid around the nipples. ? Bleeding. ? Dimpling. ? Redness. ? A change in where the nipples are. Feel for Changes 1. Lie on your back on the floor. 2. Feel each breast. To do this, follow these steps: ? Pick a breast to feel. ? Put the arm closest to that breast above your head. ? Use your other arm to feel the nipple area of your breast. Feel the area with the pads of your three middle fingers by making small circles with your fingers. For the first circle, press lightly. For the second circle, press harder. For the third circle, press even harder. ? Keep making circles with your fingers at the light,  harder, and even harder pressures as you move down your breast. Stop when you feel your ribs. ? Move your fingers a little toward the center of your body. ? Start making circles with your fingers again, this time going up until you reach your collarbone. ? Keep making up and down circles until you reach your armpit. Remember to keep using the three pressures. ? Feel the other breast in the same way. 3. Sit or stand in the shower or tub. 4. With soapy water on your skin, feel each breast the same way you did in step 2, when you were lying on the floor. Write Down What You Find After doing the self-exam, write down:  What is normal for each breast.  Any changes you find in each breast.  When you last had your period.  How often should I check my breasts? Check your breasts every month. If you are breastfeeding, the best time to check them is after you feed your baby or after you use a breast pump. If you get periods, the best time to check your breasts is 5-7 days after your period is over. When should I see my doctor? See your doctor if you notice:  A change in shape or size of your breasts or nipples.  A change in the skin of  your breast or nipples, such as red or scaly skin.  Unusual fluid coming from your nipples.  A lump or thick area that was not there before.  Pain in your breasts.  Anything that concerns you. This information is not intended to replace advice given to you by your health care provider. Make sure you discuss any questions you have with your health care provider. Document Released: 01/03/2008 Document Revised: 12/23/2015 Document Reviewed: 06/06/2015 Elsevier Interactive Patient Education  2019 [employer].

## 2018-08-07 NOTE — Progress Notes (Signed)
08/07/2018  History of Present Illness: Denise Reyes is a 53 y.o. female s/p left wire localized lumpectomy for DCIS with Dr. Burt Knack on 01/23/18.  She has completed radiation with Dr. Baruch Gouty and has been followed by Dr. Tasia Catchings with Oncology.  She's currently on Letrozole.  She reports joint pains but have gotten better after getting used to the medication.  She denies feeling any new lumps or bumps on either breast and denies any skin changes or nipple changes.  She does reports still feeling stiffness and scarring, particularly she could tell during her mammogram last month.    Past Medical History: Past Medical History:  Diagnosis Date  . Allergy    PT CURRENTLY TAKING ABX AND PREDNISONE GIVEN BY DR Tami Ribas ON 01-16-18 FOR INFECTION THAT SHE GETS THIS TIME EVERY YEAR  . Anxiety   . Arthritis    knees, wrists  . Asthma    allergy induced   . Cancer ([place])   . GERD (gastroesophageal reflux disease)    occ-no meds  . History of kidney stones    h/o  . Personal history of radiation therapy 2019   LEFT lumpectomy  . Problems with swallowing and mastication   . Special screening for malignant neoplasms, colon   . Wears contact lenses      Past Surgical History: Past Surgical History:  Procedure Laterality Date  . BILATERAL SALPINGOOPHORECTOMY Bilateral 12/30/2013  . BREAST BIOPSY Left    DUCTAL CARCINOMA IN SITU (DCIS), LOW NUCLEAR GRADE.  Marland Kitchen BREAST BIOPSY Left 01/23/2018   Procedure: BREAST BIOPSY/PARTIAL MASTECTOMY WITH NEEDLE LOCALIZATION;  Surgeon: Florene Glen, MD;  Location: [facility];  Service: General;  Laterality: Left;  . BREAST EXCISIONAL BIOPSY Left 01/23/2018   lumectomy with NL   . BREAST LUMPECTOMY Left 12/2017   DUCTAL CARCINOMA IN SITU  . COLONOSCOPY WITH PROPOFOL N/A 07/07/2016   Procedure: COLONOSCOPY WITH PROPOFOL;  Surgeon: Lucilla Lame, MD;  Location: [place];  Service: Endoscopy;  Laterality: N/A;  . ESOPHAGOGASTRODUODENOSCOPY (EGD) WITH PROPOFOL  N/A 07/07/2016   Procedure: ESOPHAGOGASTRODUODENOSCOPY (EGD) WITH PROPOFOL;  Surgeon: Lucilla Lame, MD;  Location: [place];  Service: Endoscopy;  Laterality: N/A;  . LAPAROSCOPIC ASSISTED VAGINAL HYSTERECTOMY  12/30/2013  . URETHRA SURGERY     52 months old    Home Medications: Prior to Admission medications   Medication Sig Start Date End Date Taking? Authorizing Provider  albuterol (PROVENTIL HFA;VENTOLIN HFA) 108 (90 Base) MCG/ACT inhaler Inhale 2 puffs into the lungs 2 (two) times daily as needed for wheezing or shortness of breath.  01/11/18  Yes [provider]  ALPRAZolam Duanne Moron) 0.5 MG tablet Take 0.5 mg by mouth 3 (three) times daily as needed for anxiety.   Yes [provider]  atorvastatin (LIPITOR) 20 MG tablet Take 20 mg by mouth every evening.   Yes [provider]  calcium-vitamin D (OSCAL WITH D) 500-200 MG-UNIT tablet Take 1 tablet by mouth daily with breakfast.   Yes [provider]  cetirizine-pseudoephedrine (ZYRTEC-D) 5-120 MG tablet Take 1 tablet by mouth 2 (two) times daily as needed.    Yes [provider]  cyanocobalamin 1000 MCG tablet Take 1,000 mcg by mouth daily.   Yes [provider]  letrozole (FEMARA) 2.5 MG tablet Take 1 tablet (2.5 mg total) by mouth daily. 04/16/18  Yes Earlie Server, MD  montelukast (SINGULAIR) 10 MG tablet TK 1 T PO D FOR ALLERGIES 02/20/18  Yes [provider]  Omega-3 Fatty  Acids (OMEGA-3 FISH OIL PO) Take by mouth daily.   Yes [provider]  VIRTUSSIN A/C 100-10 MG/5ML syrup TK 5 ML PO Q 4-6 H PRN FOR COUGH 02/20/18  Yes [provider]  vitamin C (ASCORBIC ACID) 500 MG tablet Take 500 mg by mouth daily.   Yes [provider]    Allergies: No Known Allergies  Review of Systems: Review of Systems  Constitutional: Negative for chills, fever and weight loss.  Respiratory: Negative for shortness of breath.   Cardiovascular: Negative for chest  pain.  Gastrointestinal: Negative for abdominal pain, nausea and vomiting.  Skin: Negative for rash.    Physical Exam BP 112/78   Pulse (!) 102   Temp 97.9 F (36.6 C) (Temporal)   Resp 16   Ht 4\' 11"  (1.499 m)   Wt 137 lb 3.2 oz (62.2 kg)   SpO2 96%   BMI 27.71 kg/m  CONSTITUTIONAL: No acute distress HEENT:  Normocephalic, atraumatic, extraocular motion intact. RESPIRATORY:  Lungs are clear, and breath sounds are equal bilaterally. Normal respiratory effort without pathologic use of accessory muscles. CARDIOVASCULAR: Heart is regular without murmurs, gallops, or rubs. BREAST:  Left breast lower lateral incision is well healed, without any evidence of breakdown or infection.  There is a firm nodule at the incision which is consistent with scar tissue.  There is otherwise no palpable masses on left breast and no lymphadenopathy.  Exam on the right side negative as well. GI: The abdomen is soft, nondistended, nontender.  NEUROLOGIC:  Motor and sensation is grossly normal.  Cranial nerves are grossly intact. PSYCH:  Alert and oriented to person, place and time. Affect is normal.  Labs/Imaging: Mammogram 07/25/18: FINDINGS: 2D and 3D full field and spot compression views of the LEFT breast and a magnification view of the lumpectomy site demonstrate no suspicious mass, nonsurgical distortion or worrisome calcifications.  Lumpectomy changes are again noted.  Mammographic images were processed with CAD.  IMPRESSION: No mammographic evidence of breast malignancy.  RECOMMENDATION: Bilateral diagnostic mammogram in 6 months to resume annual mammogram schedule.  Assessment and Plan: This is a 53 y.o. female s/p left breast wire localized lumpectomy for DCIS.  --Patient doing well on her first 6 month follow up with [place].  Denies any new masses and her exam and mammogram do no show anything suspicious.   --Patient will follow up with [place] in 6 months, at which time she will also have  bilateral mammogram per radiology recommendations to resume her annual schedule. --Patient knows to call if any questions or concerns.  Face-to-face time spent with the patient and care providers was 15 minutes, with more than 50% of the time spent counseling, educating, and coordinating care of the patient.     Melvyn Neth, [facility]

## 2018-08-23 ENCOUNTER — Inpatient Hospital Stay: Payer: 59

## 2018-08-23 ENCOUNTER — Inpatient Hospital Stay: Payer: 59 | Admitting: Oncology

## 2018-08-30 ENCOUNTER — Inpatient Hospital Stay: Payer: 59 | Attending: Oncology | Admitting: *Deleted

## 2018-08-30 ENCOUNTER — Inpatient Hospital Stay (HOSPITAL_BASED_OUTPATIENT_CLINIC_OR_DEPARTMENT_OTHER): Payer: 59 | Admitting: Oncology

## 2018-08-30 ENCOUNTER — Encounter: Payer: Self-pay | Admitting: Oncology

## 2018-08-30 ENCOUNTER — Other Ambulatory Visit: Payer: Self-pay

## 2018-08-30 VITALS — BP 122/82 | HR 108 | Temp 98.5°F | Resp 18 | Wt 138.5 lb

## 2018-08-30 DIAGNOSIS — M8580 Other specified disorders of bone density and structure, unspecified site: Secondary | ICD-10-CM

## 2018-08-30 DIAGNOSIS — Z79811 Long term (current) use of aromatase inhibitors: Secondary | ICD-10-CM

## 2018-08-30 DIAGNOSIS — D0512 Intraductal carcinoma in situ of left breast: Secondary | ICD-10-CM | POA: Diagnosis present

## 2018-08-30 LAB — COMPREHENSIVE METABOLIC PANEL
ALT: 24 U/L (ref 0–44)
AST: 24 U/L (ref 15–41)
Albumin: 4.1 g/dL (ref 3.5–5.0)
Alkaline Phosphatase: 55 U/L (ref 38–126)
Anion gap: 7 (ref 5–15)
BUN: 11 mg/dL (ref 6–20)
CHLORIDE: 108 mmol/L (ref 98–111)
CO2: 28 mmol/L (ref 22–32)
Calcium: 9.1 mg/dL (ref 8.9–10.3)
Creatinine, Ser: 0.73 mg/dL (ref 0.44–1.00)
Glucose, Bld: 99 mg/dL (ref 70–99)
POTASSIUM: 4.3 mmol/L (ref 3.5–5.1)
Sodium: 143 mmol/L (ref 135–145)
Total Bilirubin: 0.4 mg/dL (ref 0.3–1.2)
Total Protein: 6.7 g/dL (ref 6.5–8.1)

## 2018-08-30 LAB — CBC WITH DIFFERENTIAL/PLATELET
Abs Immature Granulocytes: 0.01 10*3/uL (ref 0.00–0.07)
BASOS PCT: 1 %
Basophils Absolute: 0.1 10*3/uL (ref 0.0–0.1)
Eosinophils Absolute: 0.1 10*3/uL (ref 0.0–0.5)
Eosinophils Relative: 2 %
HCT: 38.8 % (ref 36.0–46.0)
HEMOGLOBIN: 13.1 g/dL (ref 12.0–15.0)
Immature Granulocytes: 0 %
LYMPHS PCT: 30 %
Lymphs Abs: 2 10*3/uL (ref 0.7–4.0)
MCH: 31.4 pg (ref 26.0–34.0)
MCHC: 33.8 g/dL (ref 30.0–36.0)
MCV: 93 fL (ref 80.0–100.0)
MONO ABS: 0.7 10*3/uL (ref 0.1–1.0)
MONOS PCT: 10 %
Neutro Abs: 3.7 10*3/uL (ref 1.7–7.7)
Neutrophils Relative %: 57 %
Platelets: 291 10*3/uL (ref 150–400)
RBC: 4.17 MIL/uL (ref 3.87–5.11)
RDW: 12.2 % (ref 11.5–15.5)
WBC: 6.5 10*3/uL (ref 4.0–10.5)
nRBC: 0 % (ref 0.0–0.2)

## 2018-08-30 NOTE — Progress Notes (Signed)
Patient here for follow up. Complains of stiff and achy hands. Wakes up in the morning and Right thumb is stiff. 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

## 2018-08-30 NOTE — Progress Notes (Signed)
Hematology/Oncology follow up  note [facility] Telephone:([phone]) [phone] [phone]) [phone]   Patient Care Team: Marguerita Merles, MD as PCP - General (Family Medicine)  REFERRING PROVIDER: Dr.Cooper. REASON FOR VISIT Follow up for treatment of :  DCIS  HISTORY OF PRESENTING ILLNESS:  Denise Reyes is a  53 y.o.  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   Patient had a mammogram done in May 2019.  Mammogram showed indeterminate calcifications in the upper outer quadrant of the left breast for which biopsy is indicated.  Patient is status post stereotactic guided core biopsy of the left breast calcifications. 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   Patient underwent left breast lumpectomy on 01/23/2018, pathology showed fibrocystic change with microcalcifications Patient was referred to oncology for further evaluation and management.   # Patient's case was discussed at breast tumor board on 02/11/2018. patient that although her DCIS foci is very small, her breast has very proliferative background, consistent with hormone replacement treatment.  Status post adjuvant RT completed in September 2019. Letrozole was started in September 2019   # Postmenopausal status: obtained patient's GYN operation report from Dr. Louann Liv office. Patient is in menopasue state as she had  Patient has had bilateral salpingo-oophorectomy and hysterectomy on 12/30/2013. Pathology reviewed.  Showed benign essentially unremarkable cervix, benign proliferative endometrium associated with benign endometrial polyp.  Adenomyosis, leiomyomata, benign right ovary with small foci of endometriosis, benign right paratubal cyst essentially unremarkable left ovary and the fallopian tube.  Marland Kitchen  #We  obtained her records from GYN office and reviewed. DEXA done in May 2019 showed left femoral neck T score -1.1, AP spine -0.3, right femoral neck -1.1   INTERVAL HISTORY Denise Reyes is a 53 y.o. female who has above history reviewed by me today presents for management of DCIS. Tolerates letrozole well. Has left shoulder joint stiffness and hot flash.  Manageable. Otherwise no new complaints.  Review of Systems  Constitutional: Negative for chills, fever, malaise/fatigue and weight loss.  HENT: Negative for nosebleeds and sore throat.   Eyes: Negative for double vision, photophobia and redness.  Respiratory: Negative for cough, shortness of breath and wheezing.   Cardiovascular: Negative for chest pain, palpitations and orthopnea.  Gastrointestinal: Negative for abdominal pain, blood in stool, nausea and vomiting.  Genitourinary: Negative for dysuria.  Musculoskeletal: Positive for myalgias. Negative for back pain and neck pain.  Skin: Negative for itching and rash.  Neurological: Negative for dizziness, tingling and tremors.  Endo/Heme/Allergies: Negative for environmental allergies. Does not bruise/bleed easily.  Psychiatric/Behavioral: Negative for depression.    MEDICAL HISTORY:  Past Medical History:  Diagnosis Date  . Allergy    PT CURRENTLY TAKING ABX AND PREDNISONE GIVEN BY DR Tami Ribas ON 01-16-18 FOR INFECTION THAT SHE GETS THIS TIME EVERY YEAR  . Anxiety   . Arthritis    knees, wrists  . Asthma    allergy induced   . Cancer ([place])   . GERD (gastroesophageal reflux disease)    occ-no meds  . History of kidney stones    h/o  . Personal history of radiation therapy 2019   LEFT lumpectomy  . Problems with swallowing and mastication   . Special screening for malignant neoplasms, colon   . Wears contact lenses     SURGICAL HISTORY: Past Surgical  History:  Procedure Laterality Date  . BILATERAL SALPINGOOPHORECTOMY Bilateral 12/30/2013  . BREAST BIOPSY Left     DUCTAL CARCINOMA IN SITU (DCIS), LOW NUCLEAR GRADE.  Marland Kitchen BREAST BIOPSY Left 01/23/2018   Procedure: BREAST BIOPSY/PARTIAL MASTECTOMY WITH NEEDLE LOCALIZATION;  Surgeon: Florene Glen, MD;  Location: [facility];  Service: General;  Laterality: Left;  . BREAST EXCISIONAL BIOPSY Left 01/23/2018   lumectomy with NL   . BREAST LUMPECTOMY Left 12/2017   DUCTAL CARCINOMA IN SITU  . COLONOSCOPY WITH PROPOFOL N/A 07/07/2016   Procedure: COLONOSCOPY WITH PROPOFOL;  Surgeon: Lucilla Lame, MD;  Location: [place];  Service: Endoscopy;  Laterality: N/A;  . ESOPHAGOGASTRODUODENOSCOPY (EGD) WITH PROPOFOL N/A 07/07/2016   Procedure: ESOPHAGOGASTRODUODENOSCOPY (EGD) WITH PROPOFOL;  Surgeon: Lucilla Lame, MD;  Location: [place];  Service: Endoscopy;  Laterality: N/A;  . LAPAROSCOPIC ASSISTED VAGINAL HYSTERECTOMY  12/30/2013  . URETHRA SURGERY     33 months old    SOCIAL HISTORY: Social History   Socioeconomic History  . Marital status: Single    Spouse name: Not on file  . Number of children: Not on file  . Years of education: Not on file  . Highest education level: Not on file  Occupational History  . Not on file  Social Needs  . Financial resource strain: Not on file  . Food insecurity:    Worry: Not on file    Inability: Not on file  . Transportation needs:    Medical: Not on file    Non-medical: Not on file  Tobacco Use  . Smoking status: Current Every Day Smoker    Packs/day: 1.00    Years: 35.00    Pack years: 35.00    Types: Cigarettes  . Smokeless tobacco: Never Used  Substance and Sexual Activity  . Alcohol use: Yes    Alcohol/week: 2.0 standard drinks    Types: 2 Cans of beer per week    Comment: occ weekend beers  . Drug use: No  . Sexual activity: Not on file  Lifestyle  . Physical activity:    Days per week: Not on file    Minutes per session: Not on file  . Stress: Not on file  Relationships  . Social connections:    Talks on phone: Not on file     Gets together: Not on file    Attends religious service: Not on file    Active member of club or organization: Not on file    Attends meetings of clubs or organizations: Not on file    Relationship status: Not on file  . Intimate partner violence:    Fear of current or ex partner: Not on file    Emotionally abused: Not on file    Physically abused: Not on file    Forced sexual activity: Not on file  Other Topics Concern  . Not on file  Social History Narrative  . Not on file    FAMILY HISTORY: Family History  Problem Relation Age of Onset  . Hypertension Mother   . High Cholesterol Mother   . Hypertension Father   . Heart disease Father   . COPD Father   . High Cholesterol Father   . Heart disease Paternal Grandfather   . Cancer Sister        ovarian  . Cancer Maternal Aunt   . Cancer Paternal Grandmother 32       breast  . Breast cancer Neg Hx     ALLERGIES:  has No Known Allergies.  MEDICATIONS:  Current Outpatient Medications  Medication Sig Dispense Refill  . albuterol (PROVENTIL HFA;VENTOLIN HFA) 108 (90 Base) MCG/ACT inhaler Inhale 2 puffs into the lungs 2 (two) times daily as needed for wheezing or shortness of breath.   4  . ALPRAZolam (XANAX) 0.5 MG tablet Take 0.5 mg by mouth 3 (three) times daily as needed for anxiety.    Marland Kitchen atorvastatin (LIPITOR) 20 MG tablet Take 20 mg by mouth every evening.    . calcium-vitamin D (OSCAL WITH D) 500-200 MG-UNIT tablet Take 1 tablet by mouth daily with breakfast.    . cetirizine-pseudoephedrine (ZYRTEC-D) 5-120 MG tablet Take 1 tablet by mouth 2 (two) times daily as needed.     . cyanocobalamin 1000 MCG tablet Take 1,000 mcg by mouth daily.    Marland Kitchen letrozole (FEMARA) 2.5 MG tablet Take 1 tablet (2.5 mg total) by mouth daily. 30 tablet 6  . montelukast (SINGULAIR) 10 MG tablet TK 1 T PO D FOR ALLERGIES  2  . Omega-3 Fatty Acids (OMEGA-3 FISH OIL PO) Take by mouth daily.    Marland Kitchen VIRTUSSIN A/C 100-10 MG/5ML syrup TK 5 ML PO Q 4-6 H  PRN FOR COUGH  0  . vitamin C (ASCORBIC ACID) 500 MG tablet Take 500 mg by mouth daily.     No current facility-administered medications for this visit.      PHYSICAL EXAMINATION: ECOG PERFORMANCE STATUS: 0 - Asymptomatic Vitals:   08/30/18 1317  BP: 122/82  Pulse: (!) 108  Resp: 18  Temp: 98.5 F (36.9 C)   Filed Weights   08/30/18 1317  Weight: 138 lb 8 oz (62.8 kg)    Physical Exam Constitutional:      General: She is not in acute distress. HENT:     Head: Normocephalic and atraumatic.  Eyes:     General: No scleral icterus.    Conjunctiva/sclera: Conjunctivae normal.     Pupils: Pupils are equal, round, and reactive to light.  Neck:     Musculoskeletal: Normal range of motion and neck supple.  Cardiovascular:     Rate and Rhythm: Normal rate and regular rhythm.     Heart sounds: Normal heart sounds.  Pulmonary:     Effort: Pulmonary effort is normal. No respiratory distress.     Breath sounds: Normal breath sounds. No wheezing or rales.  Chest:     Chest wall: No tenderness.  Abdominal:     General: Bowel sounds are normal. There is no distension.     Palpations: Abdomen is soft. There is no mass.     Tenderness: There is no abdominal tenderness.  Musculoskeletal: Normal range of motion.        General: No deformity.  Lymphadenopathy:     Cervical: No cervical adenopathy.  Skin:    General: Skin is warm and dry.     Findings: No erythema or rash.  Neurological:     Mental Status: She is alert and oriented to person, place, and time.     Cranial Nerves: No cranial nerve deficit.     Coordination: Coordination normal.  Psychiatric:        Behavior: Behavior normal.        Thought Content: Thought content normal.       LABORATORY DATA:  I have reviewed the data as listed Lab Results  Component Value Date   WBC 4.8 05/17/2018   HGB 13.6 05/17/2018   HCT 39.9 05/17/2018   MCV 92.1 05/17/2018  PLT 272 05/17/2018   Recent Labs    02/12/18 1338  04/16/18 0924 05/17/18 0839  NA 136 141 138  K 4.0 4.5 4.5  CL 105 106 104  CO2 23 27 27   GLUCOSE 94 78 98  BUN 9 10 8   CREATININE 0.59 0.59 0.65  CALCIUM 9.0 9.5 9.9  GFRNONAA >60 >60 >60  GFRAA >60 >60 >60  PROT 6.7 6.6 6.5  ALBUMIN 3.8 4.1 4.0  AST 23 24 22   ALT 26 21 21   ALKPHOS 57 44 46  BILITOT 0.9 0.9 0.8   Iron/TIBC/Ferritin/ %Sat No results found for: IRON, TIBC, FERRITIN, IRONPCTSAT   RADIOGRAPHIC STUDIES: I have personally reviewed the radiological images as listed and agreed with the findings in the report. 12/11/2017  1. Benign RIGHT breast calcifications. 2. Indeterminate calcifications in the UPPER OUTER QUADRANT of the LEFT breast for which biopsy is indicated. 3. No sonographic or mammographic abnormality in the 4 o'clock location area of clinical concern. 07/25/2018  Diagnostic mammogram showed no mammographic evidence of breast malignancy.  Recommend bilateral diagnostic mammogram in 6 months to resume annual mammogram scheduled.  ASSESSMENT & PLAN:  1. Ductal carcinoma in situ (DCIS) of left breast   2. Osteopenia, unspecified location   3. Aromatase inhibitor use    # DCIS s/p lumpectomy and adjuvant RT.  Tolerating letrozole.  Continue 5 years.  Recommend  diagnostic mammogram bilaterally in 6 months to resume annual mammogram scheduled. Discussed with patient to follow-up with primary care physician for monitoring lipid profiles.  # Osteopenia, Prolia and covered by her insurance.  Rationale of using bisphosphonate for osteopenia treatment while using aromatase inhibitor was discussed with patient.  Potential side effects including bone necrosis, hypoglycemia etc. discussed with patient.    Plan Zometa 4 mg every 6 months Repeat bone density every 2 years.  Continue calcium and vitamin D supplements.  Orders Placed This Encounter  Procedures  . CBC with Differential/Platelet    Standing Status:   Future    Standing Expiration Date:   09/01/2019  .  Comprehensive metabolic panel    Standing Status:   Future    Standing Expiration Date:   09/01/2019    All questions were answered. The patient knows to call the clinic with any problems questions or concerns.  Return of visit:  12 weeks.   Earlie Server, MD, PhD Hematology Oncology [facility] Pager- [phone] 08/30/2018

## 2018-09-03 NOTE — Progress Notes (Signed)
Clarified Zometa dose should be 4mg  q75months with MD, updated order

## 2018-09-04 ENCOUNTER — Other Ambulatory Visit: Payer: Self-pay | Admitting: Oncology

## 2018-09-04 ENCOUNTER — Inpatient Hospital Stay: Payer: 59

## 2018-09-04 ENCOUNTER — Inpatient Hospital Stay: Payer: 59 | Attending: Oncology

## 2018-09-04 DIAGNOSIS — M8580 Other specified disorders of bone density and structure, unspecified site: Secondary | ICD-10-CM

## 2018-09-04 MED ORDER — ZOLEDRONIC ACID 4 MG/100ML IV SOLN
4.0000 mg | Freq: Once | INTRAVENOUS | Status: AC
  Administered 2018-09-04: 4 mg via INTRAVENOUS
  Filled 2018-09-04: qty 100

## 2018-09-04 MED ORDER — SODIUM CHLORIDE 0.9 % IV SOLN
Freq: Once | INTRAVENOUS | Status: AC
  Administered 2018-09-04: 14:00:00 via INTRAVENOUS
  Filled 2018-09-04: qty 250

## 2018-09-05 ENCOUNTER — Telehealth: Payer: Self-pay | Admitting: *Deleted

## 2018-09-05 ENCOUNTER — Encounter: Payer: Self-pay | Admitting: Oncology

## 2018-09-05 NOTE — Telephone Encounter (Signed)
Patient left a voice mail message on the triage line stating that she has severe pain in her shoulder blades, worse on the left starting at around 5 am this morning. She received her first zometa infusion yesterday. Please advise.    dhs

## 2018-09-05 NOTE — Telephone Encounter (Signed)
Per Dr. Collie Siad advice, returned call to patient and told her to take tylenol and/or ibuprofen as needed for her shoulder pain, and to let us know if it gets worse and not better. Patient verbalized understanding. dhs

## 2018-10-16 ENCOUNTER — Other Ambulatory Visit: Payer: Self-pay

## 2018-10-17 ENCOUNTER — Other Ambulatory Visit: Payer: Self-pay

## 2018-10-17 ENCOUNTER — Ambulatory Visit
Admission: RE | Admit: 2018-10-17 | Discharge: 2018-10-17 | Disposition: A | Discharge status: Home / Self Care | Payer: 59 | Source: Ambulatory Visit | Attending: Radiation Oncology | Admitting: Radiation Oncology

## 2018-10-17 ENCOUNTER — Encounter: Payer: Self-pay | Admitting: Radiation Oncology

## 2018-10-17 VITALS — BP 123/84 | HR 100 | Temp 96.1°F | Resp 18 | Wt 135.9 lb

## 2018-10-17 DIAGNOSIS — Z170 Estrogen receptor positive status [ER+]: Secondary | ICD-10-CM | POA: Diagnosis not present

## 2018-10-17 DIAGNOSIS — D0512 Intraductal carcinoma in situ of left breast: Secondary | ICD-10-CM | POA: Insufficient documentation

## 2018-10-17 DIAGNOSIS — Z79811 Long term (current) use of aromatase inhibitors: Secondary | ICD-10-CM | POA: Diagnosis not present

## 2018-10-17 DIAGNOSIS — Z923 Personal history of irradiation: Secondary | ICD-10-CM | POA: Diagnosis not present

## 2018-10-17 NOTE — Progress Notes (Signed)
Radiation Oncology Follow up Note  Name: Denise Reyes   Date:   10/17/2018 MRN:  [id number] DOB: 26-Feb-1966    This 53 y.o. female presents to the clinic today for 6 month follow-up status post whole breast radiation to her left breast for ER/PR positive ductal carcinoma in situ.  REFERRING PROVIDER: Marguerita Merles, MD  HPI: patient is a 53 year old female now out 6 months having completed whole breast radiation to her left breast for ER/PR positive ductal carcinoma in situ. Seen today in routine follow-up she is doing well. She specifically denies breast tenderness cough or bone pain. She recently had a mammogram.which I have reviewed with was BI-RADS 2 benign. She is currently on Femara tolerate that well without side effect.  COMPLICATIONS OF TREATMENT: none  FOLLOW UP COMPLIANCE: keeps appointments   PHYSICAL EXAM:  BP 123/84 (BP Location: Left Arm, Patient Position: Sitting)   Pulse 100   Temp (!) 96.1 F (35.6 C) (Tympanic)   Resp 18   Wt 135 lb 14.6 oz (61.7 kg)   BMI 27.45 kg/m  Lungs are clear to A&P cardiac examination essentially unremarkable with regular rate and rhythm. No dominant mass or nodularity is noted in either breast in 2 positions examined. Incision is well-healed. No axillary or supraclavicular adenopathy is appreciated. Cosmetic result is excellent.Well-developed well-nourished patient in NAD. HEENT reveals PERLA, EOMI, discs not visualized.  Oral cavity is clear. No oral mucosal lesions are identified. Neck is clear without evidence of cervical or supraclavicular adenopathy. Lungs are clear to A&P. Cardiac examination is essentially unremarkable with regular rate and rhythm without murmur rub or thrill. Abdomen is benign with no organomegaly or masses noted. Motor sensory and DTR levels are equal and symmetric in the upper and lower extremities. Cranial nerves II through XII are grossly intact. Proprioception is intact. No peripheral adenopathy or edema is  identified. No motor or sensory levels are noted. Crude visual fields are within normal range.  RADIOLOGY RESULTS: mammograms reviewed and compatible with the above-stated findings  PLAN: present time patient is doing well with no evidence of disease. I'm please were overall progress. I've asked to see her back in 6 months for follow-up and then will start once your follow-up visits. Patient is to call with any concerns at any time.she continues on Femara without side effect.  I would like to take this opportunity to thank you for allowing me to participate in the care of your patient.Denise Filbert, MD

## 2018-11-27 ENCOUNTER — Inpatient Hospital Stay: Payer: 59 | Attending: Oncology

## 2018-11-27 ENCOUNTER — Other Ambulatory Visit: Payer: Self-pay

## 2018-11-27 DIAGNOSIS — D0512 Intraductal carcinoma in situ of left breast: Secondary | ICD-10-CM | POA: Diagnosis not present

## 2018-11-27 DIAGNOSIS — M8580 Other specified disorders of bone density and structure, unspecified site: Secondary | ICD-10-CM

## 2018-11-27 DIAGNOSIS — Z79811 Long term (current) use of aromatase inhibitors: Secondary | ICD-10-CM | POA: Insufficient documentation

## 2018-11-27 LAB — CBC WITH DIFFERENTIAL/PLATELET
Abs Immature Granulocytes: 0.01 10*3/uL (ref 0.00–0.07)
Basophils Absolute: 0.1 10*3/uL (ref 0.0–0.1)
Basophils Relative: 1 %
Eosinophils Absolute: 0.1 10*3/uL (ref 0.0–0.5)
Eosinophils Relative: 2 %
HCT: 41.9 % (ref 36.0–46.0)
Hemoglobin: 14.3 g/dL (ref 12.0–15.0)
Immature Granulocytes: 0 %
Lymphocytes Relative: 31 %
Lymphs Abs: 2.2 10*3/uL (ref 0.7–4.0)
MCH: 31.3 pg (ref 26.0–34.0)
MCHC: 34.1 g/dL (ref 30.0–36.0)
MCV: 91.7 fL (ref 80.0–100.0)
Monocytes Absolute: 0.7 10*3/uL (ref 0.1–1.0)
Monocytes Relative: 9 %
Neutro Abs: 4.1 10*3/uL (ref 1.7–7.7)
Neutrophils Relative %: 57 %
Platelets: 299 10*3/uL (ref 150–400)
RBC: 4.57 MIL/uL (ref 3.87–5.11)
RDW: 11.9 % (ref 11.5–15.5)
WBC: 7.1 10*3/uL (ref 4.0–10.5)
nRBC: 0 % (ref 0.0–0.2)

## 2018-11-27 LAB — COMPREHENSIVE METABOLIC PANEL
ALT: 18 U/L (ref 0–44)
AST: 20 U/L (ref 15–41)
Albumin: 4 g/dL (ref 3.5–5.0)
Alkaline Phosphatase: 44 U/L (ref 38–126)
Anion gap: 8 (ref 5–15)
BUN: 14 mg/dL (ref 6–20)
CO2: 27 mmol/L (ref 22–32)
Calcium: 9.4 mg/dL (ref 8.9–10.3)
Chloride: 105 mmol/L (ref 98–111)
Creatinine, Ser: 0.63 mg/dL (ref 0.44–1.00)
GFR calc Af Amer: >60 mL/min (ref >60)
GFR calc non Af Amer: >60 mL/min (ref >60)
Glucose, Bld: 71 mg/dL (ref 70–99)
Potassium: 4.3 mmol/L (ref 3.5–5.1)
Sodium: 140 mmol/L (ref 135–145)
Total Bilirubin: 0.5 mg/dL (ref 0.3–1.2)
Total Protein: 7 g/dL (ref 6.5–8.1)

## 2018-11-28 ENCOUNTER — Other Ambulatory Visit: Payer: Self-pay

## 2018-11-29 ENCOUNTER — Encounter: Payer: Self-pay | Admitting: Oncology

## 2018-11-29 ENCOUNTER — Other Ambulatory Visit: Payer: Self-pay

## 2018-11-29 ENCOUNTER — Inpatient Hospital Stay: Payer: 59

## 2018-11-29 ENCOUNTER — Inpatient Hospital Stay: Payer: 59 | Attending: Oncology | Admitting: Oncology

## 2018-11-29 DIAGNOSIS — Z780 Asymptomatic menopausal state: Secondary | ICD-10-CM

## 2018-11-29 DIAGNOSIS — M810 Age-related osteoporosis without current pathological fracture: Secondary | ICD-10-CM

## 2018-11-29 DIAGNOSIS — D0512 Intraductal carcinoma in situ of left breast: Secondary | ICD-10-CM | POA: Diagnosis not present

## 2018-11-29 DIAGNOSIS — Z79811 Long term (current) use of aromatase inhibitors: Secondary | ICD-10-CM | POA: Diagnosis not present

## 2018-11-29 MED ORDER — LETROZOLE 2.5 MG PO TABS: 2.5000 mg | ORAL_TABLET | Freq: Every day | ORAL | 6 refills | Status: DC

## 2018-11-29 NOTE — Progress Notes (Signed)
HEMATOLOGY-ONCOLOGY TeleHEALTH VISIT PROGRESS NOTE  I connected with Denise Reyes on 11/29/18 at 10:00 AM EDT by video enabled telemedicine visit and verified that I am speaking with the correct person using two identifiers. I discussed the limitations, risks, security and privacy concerns of performing an evaluation and management service by telemedicine and the availability of in-person appointments. I also discussed with the patient that there may be a patient responsible charge related to this service. The patient expressed understanding and agreed to proceed.   Other persons participating in the visit and their role in the encounter:  Geraldine Solar, CMA, check in patient     Patient's location: Home  Provider's location: home  Chief Complaint follow-up for management of DCIS, chronic aromatase inhibitor use, osteopenia   INTERVAL HISTORY Denise Reyes is a 53 y.o. female who has above history reviewed by me today presents for follow up visit for management of DCIS, chronic aromatase inhibitor use, osteoporosis Problems and complaints are listed below:  Started letrozole in September 2019.   Patient takes letrozole daily.  Reports continues to have chronic bone achiness and joint pains.  She is coping with it. Takes calcium and vitamin D supplementation. 09/24/2018, she received Zometa 4 mg, reports not feeling well for 3 days after the infusion. Otherwise no new complaints.  No complaints of her breast.  Denies any pain today. She follows up with primary care doctor for hypercholesterolemia    Review of Systems  Constitutional: Negative for appetite change, chills, fatigue and fever.  HENT:   Negative for hearing loss and voice change.   Eyes: Negative for eye problems.  Respiratory: Negative for chest tightness and cough.   Cardiovascular: Negative for chest pain.  Gastrointestinal: Negative for abdominal distention, abdominal pain and blood in stool.  Endocrine: Positive for  hot flashes.  Genitourinary: Negative for difficulty urinating and frequency.   Musculoskeletal: Positive for arthralgias.  Skin: Negative for itching and rash.  Neurological: Negative for extremity weakness.  Hematological: Negative for adenopathy.  Psychiatric/Behavioral: Negative for confusion.    Past Medical History:  Diagnosis Date  . Allergy    PT CURRENTLY TAKING ABX AND PREDNISONE GIVEN BY DR Tami Ribas ON 01-16-18 FOR INFECTION THAT SHE GETS THIS TIME EVERY YEAR  . Anxiety   . Arthritis    knees, wrists  . Asthma    allergy induced   . Cancer ([place])   . GERD (gastroesophageal reflux disease)    occ-no meds  . History of kidney stones    h/o  . Personal history of radiation therapy 2019   LEFT lumpectomy  . Problems with swallowing and mastication   . Special screening for malignant neoplasms, colon   . Wears contact lenses    Past Surgical History:  Procedure Laterality Date  . BILATERAL SALPINGOOPHORECTOMY Bilateral 12/30/2013  . BREAST BIOPSY Left    DUCTAL CARCINOMA IN SITU (DCIS), LOW NUCLEAR GRADE.  Marland Kitchen BREAST BIOPSY Left 01/23/2018   Procedure: BREAST BIOPSY/PARTIAL MASTECTOMY WITH NEEDLE LOCALIZATION;  Surgeon: Florene Glen, MD;  Location: [facility];  Service: General;  Laterality: Left;  . BREAST EXCISIONAL BIOPSY Left 01/23/2018   lumectomy with NL   . BREAST LUMPECTOMY Left 12/2017   DUCTAL CARCINOMA IN SITU  . COLONOSCOPY WITH PROPOFOL N/A 07/07/2016   Procedure: COLONOSCOPY WITH PROPOFOL;  Surgeon: Lucilla Lame, MD;  Location: [place];  Service: Endoscopy;  Laterality: N/A;  . ESOPHAGOGASTRODUODENOSCOPY (EGD) WITH PROPOFOL N/A 07/07/2016   Procedure: ESOPHAGOGASTRODUODENOSCOPY (EGD) WITH PROPOFOL;  Surgeon: Lucilla Lame, MD;  Location: [place];  Service: Endoscopy;  Laterality: N/A;  . LAPAROSCOPIC ASSISTED VAGINAL HYSTERECTOMY  12/30/2013  . URETHRA SURGERY     32 months old    Family History  Problem Relation Age of Onset  .  Hypertension Mother   . High Cholesterol Mother   . Hypertension Father   . Heart disease Father   . COPD Father   . High Cholesterol Father   . Heart disease Paternal Grandfather   . Cancer Sister        ovarian  . Cancer Maternal Aunt   . Cancer Paternal Grandmother 41       breast  . Breast cancer Neg Hx     Social History   Socioeconomic History  . Marital status: Single    Spouse name: Not on file  . Number of children: Not on file  . Years of education: Not on file  . Highest education level: Not on file  Occupational History  . Not on file  Social Needs  . Financial resource strain: Not on file  . Food insecurity:    Worry: Not on file    Inability: Not on file  . Transportation needs:    Medical: Not on file    Non-medical: Not on file  Tobacco Use  . Smoking status: Current Every Day Smoker    Packs/day: 1.00    Years: 35.00    Pack years: 35.00    Types: Cigarettes  . Smokeless tobacco: Never Used  Substance and Sexual Activity  . Alcohol use: Yes    Alcohol/week: 2.0 standard drinks    Types: 2 Cans of beer per week    Comment: occ weekend beers  . Drug use: No  . Sexual activity: Not on file  Lifestyle  . Physical activity:    Days per week: Not on file    Minutes per session: Not on file  . Stress: Not on file  Relationships  . Social connections:    Talks on phone: Not on file    Gets together: Not on file    Attends religious service: Not on file    Active member of club or organization: Not on file    Attends meetings of clubs or organizations: Not on file    Relationship status: Not on file  . Intimate partner violence:    Fear of current or ex partner: Not on file    Emotionally abused: Not on file    Physically abused: Not on file    Forced sexual activity: Not on file  Other Topics Concern  . Not on file  Social History Narrative  . Not on file    Current Outpatient Medications on File Prior to Visit  Medication Sig Dispense  Refill  . albuterol (PROVENTIL HFA;VENTOLIN HFA) 108 (90 Base) MCG/ACT inhaler Inhale 2 puffs into the lungs 2 (two) times daily as needed for wheezing or shortness of breath.   4  . ALPRAZolam (XANAX) 0.5 MG tablet Take 0.5 mg by mouth 3 (three) times daily as needed for anxiety.    Marland Kitchen atorvastatin (LIPITOR) 20 MG tablet Take 20 mg by mouth every evening.    . calcium-vitamin D (OSCAL WITH D) 500-200 MG-UNIT tablet Take 1 tablet by mouth daily with breakfast.    . cetirizine-pseudoephedrine (ZYRTEC-D) 5-120 MG tablet Take 1 tablet by mouth 2 (two) times daily as needed.     . cyanocobalamin 1000 MCG tablet Take 1,000 mcg  by mouth daily.    . Omega-3 Fatty Acids (OMEGA-3 FISH OIL PO) Take by mouth daily.    . vitamin C (ASCORBIC ACID) 500 MG tablet Take 500 mg by mouth daily.    . montelukast (SINGULAIR) 10 MG tablet TK 1 T PO D FOR ALLERGIES  2   No current facility-administered medications on file prior to visit.     No Known Allergies     Observations/Objective: Today's Vitals   11/29/18 0946  PainSc: 0-No pain   There is no height or weight on file to calculate BMI.  Physical Exam  Constitutional: She is oriented to person, place, and time and well-developed, well-nourished, and in no distress. No distress.  HENT:  Head: Normocephalic and atraumatic.  Pulmonary/Chest: Effort normal.  Neurological: She is alert and oriented to person, place, and time.  Psychiatric: Affect normal.    CBC    Component Value Date/Time   WBC 7.1 11/27/2018 1321   RBC 4.57 11/27/2018 1321   HGB 14.3 11/27/2018 1321   HCT 41.9 11/27/2018 1321   PLT 299 11/27/2018 1321   MCV 91.7 11/27/2018 1321   MCH 31.3 11/27/2018 1321   MCHC 34.1 11/27/2018 1321   RDW 11.9 11/27/2018 1321   LYMPHSABS 2.2 11/27/2018 1321   MONOABS 0.7 11/27/2018 1321   EOSABS 0.1 11/27/2018 1321   BASOSABS 0.1 11/27/2018 1321    CMP     Component Value Date/Time   NA 140 11/27/2018 1321   K 4.3 11/27/2018 1321   CL  105 11/27/2018 1321   CO2 27 11/27/2018 1321   GLUCOSE 71 11/27/2018 1321   BUN 14 11/27/2018 1321   CREATININE 0.63 11/27/2018 1321   CALCIUM 9.4 11/27/2018 1321   PROT 7.0 11/27/2018 1321   ALBUMIN 4.0 11/27/2018 1321   AST 20 11/27/2018 1321   ALT 18 11/27/2018 1321   ALKPHOS 44 11/27/2018 1321   BILITOT 0.5 11/27/2018 1321   GFRNONAA >60 11/27/2018 1321   GFRAA >60 11/27/2018 1321     Assessment and Plan: 1. Ductal carcinoma in situ (DCIS) of left breast   2. Osteopenia after menopause   3. Aromatase inhibitor use     Labs are reviewed and discussed with patient.  Clinically she tolerates letrozole 2.5 mg daily well.  Plan total of 5 years. Refill sent to patient's pharmacy. Recommend annual diagnostic mammogram bilateral.  She is due in June 2020.  Will schedule.  Aromatase inhibitor use, pre-existing osteopenia  Recommend patient to continue take vitamin D and calcium. Zometa 4 mg every 6 month for 2 years.  Doing August 2020.  We will arrange.   Follow Up Instructions: Lab MD 3 months.   I discussed the assessment and treatment plan with the patient. The patient was provided an opportunity to ask questions and all were answered. The patient agreed with the plan and demonstrated an understanding of the instructions.  The patient was advised to call back or seek an in-person evaluation if the symptoms worsen or if the condition fails to improve as anticipated.   Earlie Server, MD 11/29/2018 12:35 PM

## 2018-11-29 NOTE — Progress Notes (Signed)
Called patient for Telehealth visit via [facility].  Patient states she has had pain in shoulder with last Zometa treatment, requesting for next treatment to be given slower next time in hopes of lessening the side effects.

## 2019-01-23 ENCOUNTER — Ambulatory Visit
Admission: RE | Admit: 2019-01-23 | Discharge: 2019-01-23 | Disposition: A | Discharge status: Home / Self Care | Payer: 59 | Source: Ambulatory Visit | Attending: Oncology | Admitting: Oncology

## 2019-01-23 ENCOUNTER — Other Ambulatory Visit: Payer: Self-pay

## 2019-01-23 DIAGNOSIS — D0512 Intraductal carcinoma in situ of left breast: Secondary | ICD-10-CM | POA: Diagnosis present

## 2019-01-23 HISTORY — DX: Malignant neoplasm of unspecified site of unspecified female breast: C50.919

## 2019-02-19 ENCOUNTER — Ambulatory Visit: Payer: 59 | Admitting: Surgery

## 2019-02-21 ENCOUNTER — Encounter: Payer: Self-pay | Admitting: Oncology

## 2019-03-04 ENCOUNTER — Encounter: Payer: Self-pay | Admitting: Oncology

## 2019-03-04 ENCOUNTER — Inpatient Hospital Stay: Payer: 59

## 2019-03-04 ENCOUNTER — Inpatient Hospital Stay (HOSPITAL_BASED_OUTPATIENT_CLINIC_OR_DEPARTMENT_OTHER): Payer: 59 | Admitting: Oncology

## 2019-03-04 ENCOUNTER — Other Ambulatory Visit: Payer: Self-pay

## 2019-03-04 ENCOUNTER — Inpatient Hospital Stay: Payer: 59 | Attending: Oncology

## 2019-03-04 VITALS — BP 117/82 | HR 107 | Temp 98.6°F | Wt 132.0 lb

## 2019-03-04 DIAGNOSIS — Z79811 Long term (current) use of aromatase inhibitors: Secondary | ICD-10-CM

## 2019-03-04 DIAGNOSIS — M810 Age-related osteoporosis without current pathological fracture: Secondary | ICD-10-CM | POA: Diagnosis not present

## 2019-03-04 DIAGNOSIS — D0512 Intraductal carcinoma in situ of left breast: Secondary | ICD-10-CM

## 2019-03-04 DIAGNOSIS — M8580 Other specified disorders of bone density and structure, unspecified site: Secondary | ICD-10-CM | POA: Insufficient documentation

## 2019-03-04 DIAGNOSIS — Z780 Asymptomatic menopausal state: Secondary | ICD-10-CM

## 2019-03-04 LAB — COMPREHENSIVE METABOLIC PANEL
ALT: 19 U/L (ref 0–44)
AST: 20 U/L (ref 15–41)
Albumin: 4.1 g/dL (ref 3.5–5.0)
Alkaline Phosphatase: 48 U/L (ref 38–126)
Anion gap: 7 (ref 5–15)
BUN: 12 mg/dL (ref 6–20)
CO2: 26 mmol/L (ref 22–32)
Calcium: 9.1 mg/dL (ref 8.9–10.3)
Chloride: 105 mmol/L (ref 98–111)
Creatinine, Ser: 0.63 mg/dL (ref 0.44–1.00)
GFR calc Af Amer: >60 mL/min (ref >60)
GFR calc non Af Amer: >60 mL/min (ref >60)
Glucose, Bld: 101 mg/dL — ABNORMAL HIGH (ref 70–99)
Potassium: 3.7 mmol/L (ref 3.5–5.1)
Sodium: 138 mmol/L (ref 135–145)
Total Bilirubin: 0.7 mg/dL (ref 0.3–1.2)
Total Protein: 6.8 g/dL (ref 6.5–8.1)

## 2019-03-04 LAB — CBC WITH DIFFERENTIAL/PLATELET
Abs Immature Granulocytes: 0.01 10*3/uL (ref 0.00–0.07)
Basophils Absolute: 0.1 10*3/uL (ref 0.0–0.1)
Basophils Relative: 1 %
Eosinophils Absolute: 0.1 10*3/uL (ref 0.0–0.5)
Eosinophils Relative: 1 %
HCT: 39.6 % (ref 36.0–46.0)
Hemoglobin: 14 g/dL (ref 12.0–15.0)
Immature Granulocytes: 0 %
Lymphocytes Relative: 42 %
Lymphs Abs: 2.7 10*3/uL (ref 0.7–4.0)
MCH: 32.1 pg (ref 26.0–34.0)
MCHC: 35.4 g/dL (ref 30.0–36.0)
MCV: 90.8 fL (ref 80.0–100.0)
Monocytes Absolute: 0.5 10*3/uL (ref 0.1–1.0)
Monocytes Relative: 8 %
Neutro Abs: 3.1 10*3/uL (ref 1.7–7.7)
Neutrophils Relative %: 48 %
Platelets: 264 10*3/uL (ref 150–400)
RBC: 4.36 MIL/uL (ref 3.87–5.11)
RDW: 11.9 % (ref 11.5–15.5)
WBC: 6.5 10*3/uL (ref 4.0–10.5)
nRBC: 0 % (ref 0.0–0.2)

## 2019-03-04 NOTE — Progress Notes (Signed)
Hematology/Oncology follow up  note [facility] Telephone:([phone] [phone]) [phone]   Patient Care Team: [name], MD as PCP - General (Obstetrics and Gynecology)  REFERRING PROVIDER: Dr.Cooper. REASON FOR VISIT Follow up for treatment of :  DCIS  HISTORY OF PRESENTING ILLNESS:  Denise Reyes is a  53 y.o.  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   Patient had a mammogram done in May 2019.  Mammogram showed indeterminate calcifications in the upper outer quadrant of the left breast for which biopsy is indicated.  Patient is status post stereotactic guided core biopsy of the left breast calcifications. 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   Patient underwent left breast lumpectomy on 01/23/2018, pathology showed fibrocystic change with microcalcifications Patient was referred to oncology for further evaluation and management.   # Patient's case was discussed at breast tumor board on 02/11/2018. patient that although her DCIS foci is very small, her breast has very proliferative background, consistent with hormone replacement treatment.  Status post adjuvant RT completed in September 2019. Letrozole was started in September 2019   # Postmenopausal status: obtained patient's GYN operation report from Dr. Louann Liv office. Patient is in menopasue state as she had  Patient has had bilateral salpingo-oophorectomy and hysterectomy on 12/30/2013. Pathology reviewed.  Showed benign essentially unremarkable cervix, benign proliferative endometrium associated with benign endometrial polyp.  Adenomyosis, leiomyomata, benign right ovary with small foci of endometriosis, benign right paratubal cyst essentially unremarkable left ovary and the fallopian tube.  Marland Kitchen #We  obtained her records from GYN office and reviewed. DEXA done in May 2019 showed left femoral neck T score -1.1, AP spine -0.3, right femoral neck -1.1  #September 2019, started on letrozole  INTERVAL HISTORY Denise Reyes is a 53 y.o. female who has above history reviewed by me today presents for management of DCIS. She has been on letrozole and tolerating well.  Chronic joint pain and hot flash.  Not manageable.  Otherwise no new complaints She reported that last time after Zometa, she has severe joint pain and muscle cramps.  Review of Systems  Constitutional: Negative for chills, fever, malaise/fatigue and weight loss.  HENT: Negative for nosebleeds and sore throat.   Eyes: Negative for double vision, photophobia and redness.  Respiratory: Negative for cough, shortness of breath and wheezing.   Cardiovascular: Negative for chest pain, palpitations and orthopnea.  Gastrointestinal: Negative for abdominal pain, blood in stool, nausea and vomiting.  Genitourinary: Negative for dysuria.  Musculoskeletal: Positive for joint pain. Negative for back pain, myalgias and neck pain.  Skin: Negative for itching and rash.  Neurological: Negative for dizziness, tingling and tremors.  Endo/Heme/Allergies: Negative for environmental allergies. Does not bruise/bleed easily.  Psychiatric/Behavioral: Negative for depression.    MEDICAL HISTORY:  Past Medical History:  Diagnosis Date  . Allergy    PT CURRENTLY TAKING ABX AND PREDNISONE GIVEN BY DR Tami Ribas ON 01-16-18 FOR INFECTION THAT SHE GETS THIS TIME EVERY YEAR  . Anxiety   . Arthritis    knees, wrists  . Asthma    allergy induced   . Breast cancer ([place])   . Cancer ([place])   . GERD (gastroesophageal reflux disease)    occ-no meds  . History of kidney stones    h/o  . Personal history of radiation therapy 2019  LEFT lumpectomy  . Problems with swallowing and mastication   . Special screening for malignant neoplasms, colon   . Wears  contact lenses     SURGICAL HISTORY: Past Surgical History:  Procedure Laterality Date  . BILATERAL SALPINGOOPHORECTOMY Bilateral 12/30/2013  . BREAST BIOPSY Left    DUCTAL CARCINOMA IN SITU (DCIS), LOW NUCLEAR GRADE.  Marland Kitchen BREAST BIOPSY Left 01/23/2018   Procedure: BREAST BIOPSY/PARTIAL MASTECTOMY WITH NEEDLE LOCALIZATION;  Surgeon: Florene Glen, MD;  Location: [facility];  Service: General;  Laterality: Left;  . BREAST EXCISIONAL BIOPSY Left 01/23/2018   lumectomy with NL   . BREAST LUMPECTOMY Left 12/2017   DUCTAL CARCINOMA IN SITU  . COLONOSCOPY WITH PROPOFOL N/A 07/07/2016   Procedure: COLONOSCOPY WITH PROPOFOL;  Surgeon: Lucilla Lame, MD;  Location: [place];  Service: Endoscopy;  Laterality: N/A;  . ESOPHAGOGASTRODUODENOSCOPY (EGD) WITH PROPOFOL N/A 07/07/2016   Procedure: ESOPHAGOGASTRODUODENOSCOPY (EGD) WITH PROPOFOL;  Surgeon: Lucilla Lame, MD;  Location: [place];  Service: Endoscopy;  Laterality: N/A;  . LAPAROSCOPIC ASSISTED VAGINAL HYSTERECTOMY  12/30/2013  . URETHRA SURGERY     45 months old    SOCIAL HISTORY: Social History   Socioeconomic History  . Marital status: Single    Spouse name: Not on file  . Number of children: Not on file  . Years of education: Not on file  . Highest education level: Not on file  Occupational History  . Not on file  Social Needs  . Financial resource strain: Not on file  . Food insecurity    Worry: Not on file    Inability: Not on file  . Transportation needs    Medical: Not on file    Non-medical: Not on file  Tobacco Use  . Smoking status: Current Every Day Smoker    Packs/day: 1.00    Years: 35.00    Pack years: 35.00    Types: Cigarettes  . Smokeless tobacco: Never Used  Substance and Sexual Activity  . Alcohol use: Yes    Alcohol/week: 2.0 standard drinks    Types: 2 Cans of beer per week    Comment: occ weekend beers  . Drug use: No  . Sexual activity: Not on file  Lifestyle  . Physical  activity    Days per week: Not on file    Minutes per session: Not on file  . Stress: Not on file  Relationships  . Social Herbalist on phone: Not on file    Gets together: Not on file    Attends religious service: Not on file    Active member of club or organization: Not on file    Attends meetings of clubs or organizations: Not on file    Relationship status: Not on file  . Intimate partner violence    Fear of current or ex partner: Not on file    Emotionally abused: Not on file    Physically abused: Not on file    Forced sexual activity: Not on file  Other Topics Concern  . Not on file  Social History Narrative  . Not on file    FAMILY HISTORY: Family History  Problem Relation Age of Onset  . Hypertension Mother   . High Cholesterol Mother   . Hypertension Father   . Heart disease Father   . COPD Father   . High Cholesterol Father   . Heart disease Paternal Grandfather   . Cancer Sister  ovarian  . Cancer Maternal Aunt   . Cancer Paternal Grandmother 16       breast  . Breast cancer Paternal Grandmother     ALLERGIES:  has No Known Allergies.  MEDICATIONS:  Current Outpatient Medications  Medication Sig Dispense Refill  . albuterol (PROVENTIL HFA;VENTOLIN HFA) 108 (90 Base) MCG/ACT inhaler Inhale 2 puffs into the lungs 2 (two) times daily as needed for wheezing or shortness of breath.   4  . ALPRAZolam (XANAX) 0.5 MG tablet Take 0.5 mg by mouth 3 (three) times daily as needed for anxiety.    Marland Kitchen atorvastatin (LIPITOR) 20 MG tablet Take 20 mg by mouth every evening.    . calcium-vitamin D (OSCAL WITH D) 500-200 MG-UNIT tablet Take 1 tablet by mouth daily with breakfast.    . cetirizine-pseudoephedrine (ZYRTEC-D) 5-120 MG tablet Take 1 tablet by mouth 2 (two) times daily as needed.     . cyanocobalamin 1000 MCG tablet Take 1,000 mcg by mouth daily.    Marland Kitchen letrozole (FEMARA) 2.5 MG tablet Take 1 tablet (2.5 mg total) by mouth daily. 30 tablet 6  .  Omega-3 Fatty Acids (OMEGA-3 FISH OIL PO) Take by mouth daily.    . vitamin C (ASCORBIC ACID) 500 MG tablet Take 500 mg by mouth daily.    . montelukast (SINGULAIR) 10 MG tablet TK 1 T PO D FOR ALLERGIES  2   No current facility-administered medications for this visit.      PHYSICAL EXAMINATION: ECOG PERFORMANCE STATUS: 0 - Asymptomatic Vitals:   03/04/19 1338  BP: 117/82  Pulse: (!) 107  Temp: 98.6 F (37 C)   Filed Weights   03/04/19 1338  Weight: 132 lb (59.9 kg)    Physical Exam Constitutional:      General: She is not in acute distress. HENT:     Head: Normocephalic and atraumatic.  Eyes:     General: No scleral icterus.    Conjunctiva/sclera: Conjunctivae normal.     Pupils: Pupils are equal, round, and reactive to light.  Neck:     Musculoskeletal: Normal range of motion and neck supple.  Cardiovascular:     Rate and Rhythm: Normal rate and regular rhythm.     Heart sounds: Normal heart sounds.  Pulmonary:     Effort: Pulmonary effort is normal. No respiratory distress.     Breath sounds: Normal breath sounds. No wheezing or rales.  Chest:     Chest wall: No tenderness.  Abdominal:     General: Bowel sounds are normal. There is no distension.     Palpations: Abdomen is soft. There is no mass.     Tenderness: There is no abdominal tenderness.  Musculoskeletal: Normal range of motion.        General: No deformity.  Lymphadenopathy:     Cervical: No cervical adenopathy.  Skin:    General: Skin is warm and dry.     Findings: No erythema or rash.  Neurological:     Mental Status: She is alert and oriented to person, place, and time.     Cranial Nerves: No cranial nerve deficit.     Coordination: Coordination normal.  Psychiatric:        Behavior: Behavior normal.        Thought Content: Thought content normal.       LABORATORY DATA:  I have reviewed the data as listed Lab Results  Component Value Date   WBC 6.5 03/04/2019   HGB 14.0 03/04/2019  HCT 39.6 03/04/2019   MCV 90.8 03/04/2019   PLT 264 03/04/2019   Recent Labs    08/30/18 1259 11/27/18 1321 03/04/19 1308  NA 143 140 138  K 4.3 4.3 3.7  CL 108 105 105  CO2 28 27 26   GLUCOSE 99 71 101*  BUN 11 14 12   CREATININE 0.73 0.63 0.63  CALCIUM 9.1 9.4 9.1  GFRNONAA >60 >60 >60  GFRAA >60 >60 >60  PROT 6.7 7.0 6.8  ALBUMIN 4.1 4.0 4.1  AST 24 20 20   ALT 24 18 19   ALKPHOS 55 44 48  BILITOT 0.4 0.5 0.7   Iron/TIBC/Ferritin/ %Sat No results found for: IRON, TIBC, FERRITIN, IRONPCTSAT   RADIOGRAPHIC STUDIES: I have personally reviewed the radiological images as listed and agreed with the findings in the report. 12/11/2017  1. Benign RIGHT breast calcifications. 2. Indeterminate calcifications in the UPPER OUTER QUADRANT of the LEFT breast for which biopsy is indicated. 3. No sonographic or mammographic abnormality in the 4 o'clock location area of clinical concern. 07/25/2018  Diagnostic mammogram showed no mammographic evidence of breast malignancy.  Recommend bilateral diagnostic mammogram in 6 months to resume annual mammogram scheduled. 01/23/2019 bilateral diagnostic mammogram showed no mammographic evidence of malignancy.  ASSESSMENT & PLAN:  1. Ductal carcinoma in situ (DCIS) of left breast   2. Aromatase inhibitor use   3. Osteopenia after menopause    # DCIS s/p lumpectomy and adjuvant RT.  Tolerating letrozole.  Plan to continue 5 years.   Bilateral diagnostic mammogram was independently reviewed and discussed with patient.  No mammographic evidence of malignancy. Recommend to repeat diagnostic mammogram in 1 year.   # Osteopenia,  Zometa 4 mg every 6 months.  Patient is due for Zometa treatment.  Patient had moderate muscle cramps and joint pain after last treatment.  She would like to reschedule Zometa treatment to another day when she does not have any other plans.  She is willing to try again.  Advised patient to take Tylenol as premed..  Repeat  bone density every 2 years.  Continue calcium and vitamin D supplements.  Orders Placed This Encounter  Procedures  . CBC with Differential/Platelet    Standing Status:   Future    Standing Expiration Date:   03/03/2020  . Comprehensive metabolic panel    Standing Status:   Future    Standing Expiration Date:   03/03/2020    All questions were answered. The patient knows to call the clinic with any problems questions or concerns.  Return of visit:  12 weeks.   Earlie Server, MD, PhD Hematology Oncology [facility] Pager- [phone] 03/04/2019

## 2019-03-12 ENCOUNTER — Inpatient Hospital Stay: Payer: 59

## 2019-03-12 ENCOUNTER — Other Ambulatory Visit: Payer: Self-pay

## 2019-03-12 VITALS — BP 125/79 | HR 99 | Temp 98.5°F | Resp 20

## 2019-03-12 DIAGNOSIS — M8580 Other specified disorders of bone density and structure, unspecified site: Secondary | ICD-10-CM | POA: Diagnosis not present

## 2019-03-12 MED ORDER — ZOLEDRONIC ACID 4 MG/100ML IV SOLN
4.0000 mg | Freq: Once | INTRAVENOUS | Status: AC
  Administered 2019-03-12: 4 mg via INTRAVENOUS
  Filled 2019-03-12: qty 100

## 2019-03-12 MED ORDER — SODIUM CHLORIDE 0.9 % IV SOLN
Freq: Once | INTRAVENOUS | Status: AC
  Administered 2019-03-12: 14:00:00 via INTRAVENOUS
  Filled 2019-03-12: qty 250

## 2019-04-15 ENCOUNTER — Other Ambulatory Visit: Payer: Self-pay

## 2019-04-16 ENCOUNTER — Ambulatory Visit
Admission: RE | Admit: 2019-04-16 | Discharge: 2019-04-16 | Disposition: A | Discharge status: Home / Self Care | Payer: 59 | Source: Ambulatory Visit | Attending: Radiation Oncology | Admitting: Radiation Oncology

## 2019-04-16 ENCOUNTER — Encounter: Payer: Self-pay | Admitting: Radiation Oncology

## 2019-04-16 ENCOUNTER — Other Ambulatory Visit: Payer: Self-pay

## 2019-04-16 VITALS — BP 110/80 | HR 98 | Temp 99.2°F | Resp 18 | Wt 130.8 lb

## 2019-04-16 DIAGNOSIS — Z923 Personal history of irradiation: Secondary | ICD-10-CM | POA: Diagnosis not present

## 2019-04-16 DIAGNOSIS — Z170 Estrogen receptor positive status [ER+]: Secondary | ICD-10-CM | POA: Diagnosis not present

## 2019-04-16 DIAGNOSIS — D0512 Intraductal carcinoma in situ of left breast: Secondary | ICD-10-CM | POA: Diagnosis present

## 2019-04-16 DIAGNOSIS — Z79811 Long term (current) use of aromatase inhibitors: Secondary | ICD-10-CM | POA: Diagnosis not present

## 2019-04-16 NOTE — Progress Notes (Signed)
Radiation Oncology Follow up Note  Name: Denise Reyes   Date:   04/16/2019 MRN:  [id number] DOB: Jan 16, 1966    This 53 y.o. female presents to the clinic today for 1 year follow-up status post whole breast radiation to her left breast for ER PR positive ductal carcinoma in situ.  REFERRING PROVIDER: Marguerita Merles, MD  HPI: Patient is a 53 year old female now about 1 year having completed whole breast radiation to her left breast for ER PR positive ductal carcinoma in situ.  She is seen today in routine follow-up is doing well she specifically denies breast tenderness cough or bone pain..  She had mammograms back in June which I have reviewed were BI-RADS 2 benign.  She is currently on letrozole tolerating that well without side effect.  COMPLICATIONS OF TREATMENT: none  FOLLOW UP COMPLIANCE: keeps appointments   PHYSICAL EXAM:  BP 110/80   Pulse 98   Temp 99.2 F (37.3 C)   Resp 18   Wt 130 lb 12.8 oz (59.3 kg)   BMI 26.42 kg/m  Lungs are clear to A&P cardiac examination essentially unremarkable with regular rate and rhythm. No dominant mass or nodularity is noted in either breast in 2 positions examined. Incision is well-healed. No axillary or supraclavicular adenopathy is appreciated. Cosmetic result is excellent.  Well-developed well-nourished patient in NAD. HEENT reveals PERLA, EOMI, discs not visualized.  Oral cavity is clear. No oral mucosal lesions are identified. Neck is clear without evidence of cervical or supraclavicular adenopathy. Lungs are clear to A&P. Cardiac examination is essentially unremarkable with regular rate and rhythm without murmur rub or thrill. Abdomen is benign with no organomegaly or masses noted. Motor sensory and DTR levels are equal and symmetric in the upper and lower extremities. Cranial nerves II through XII are grossly intact. Proprioception is intact. No peripheral adenopathy or edema is identified. No motor or sensory levels are noted. Crude visual  fields are within normal range.  RADIOLOGY RESULTS: Mammograms reviewed compatible with above-stated findings  PLAN: Present time she is 1 year out doing well with no evidence of disease and excellent cosmetic result.  I am pleased with her overall progress.  I have asked to see her back in 1 year for follow-up.  She continues on letrozole without side effect.  Patient knows to call with any concerns at any time.  I would like to take this opportunity to thank you for allowing me to participate in the care of your patient.Noreene Filbert, MD

## 2019-06-02 ENCOUNTER — Other Ambulatory Visit: Payer: Self-pay

## 2019-06-02 ENCOUNTER — Ambulatory Visit: Payer: 59 | Admitting: Oncology

## 2019-06-02 ENCOUNTER — Encounter: Payer: Self-pay | Admitting: Oncology

## 2019-06-02 ENCOUNTER — Other Ambulatory Visit: Payer: 59

## 2019-06-02 NOTE — Progress Notes (Signed)
Patient prescreened for appointment. Patient has no concerns or questions.  

## 2019-06-03 ENCOUNTER — Inpatient Hospital Stay: Payer: 59 | Attending: Oncology

## 2019-06-03 ENCOUNTER — Other Ambulatory Visit: Payer: Self-pay

## 2019-06-03 ENCOUNTER — Inpatient Hospital Stay (HOSPITAL_BASED_OUTPATIENT_CLINIC_OR_DEPARTMENT_OTHER): Payer: 59 | Admitting: Oncology

## 2019-06-03 VITALS — BP 112/78 | HR 103 | Temp 98.8°F | Resp 18 | Wt 131.8 lb

## 2019-06-03 DIAGNOSIS — M8580 Other specified disorders of bone density and structure, unspecified site: Secondary | ICD-10-CM | POA: Insufficient documentation

## 2019-06-03 DIAGNOSIS — D0512 Intraductal carcinoma in situ of left breast: Secondary | ICD-10-CM | POA: Diagnosis present

## 2019-06-03 DIAGNOSIS — Z79811 Long term (current) use of aromatase inhibitors: Secondary | ICD-10-CM

## 2019-06-03 LAB — COMPREHENSIVE METABOLIC PANEL
ALT: 21 U/L (ref 0–44)
AST: 21 U/L (ref 15–41)
Albumin: 4 g/dL (ref 3.5–5.0)
Alkaline Phosphatase: 45 U/L (ref 38–126)
Anion gap: 8 (ref 5–15)
BUN: 12 mg/dL (ref 6–20)
CO2: 25 mmol/L (ref 22–32)
Calcium: 9.1 mg/dL (ref 8.9–10.3)
Chloride: 105 mmol/L (ref 98–111)
Creatinine, Ser: 0.66 mg/dL (ref 0.44–1.00)
GFR calc Af Amer: >60 mL/min (ref >60)
GFR calc non Af Amer: >60 mL/min (ref >60)
Glucose, Bld: 96 mg/dL (ref 70–99)
Potassium: 4.3 mmol/L (ref 3.5–5.1)
Sodium: 138 mmol/L (ref 135–145)
Total Bilirubin: 0.7 mg/dL (ref 0.3–1.2)
Total Protein: 6.8 g/dL (ref 6.5–8.1)

## 2019-06-03 LAB — CBC WITH DIFFERENTIAL/PLATELET
Abs Immature Granulocytes: 0.01 10*3/uL (ref 0.00–0.07)
Basophils Absolute: 0 10*3/uL (ref 0.0–0.1)
Basophils Relative: 1 %
Eosinophils Absolute: 0.1 10*3/uL (ref 0.0–0.5)
Eosinophils Relative: 2 %
HCT: 40.3 % (ref 36.0–46.0)
Hemoglobin: 13.8 g/dL (ref 12.0–15.0)
Immature Granulocytes: 0 %
Lymphocytes Relative: 34 %
Lymphs Abs: 1.8 10*3/uL (ref 0.7–4.0)
MCH: 31.8 pg (ref 26.0–34.0)
MCHC: 34.2 g/dL (ref 30.0–36.0)
MCV: 92.9 fL (ref 80.0–100.0)
Monocytes Absolute: 0.5 10*3/uL (ref 0.1–1.0)
Monocytes Relative: 9 %
Neutro Abs: 3 10*3/uL (ref 1.7–7.7)
Neutrophils Relative %: 54 %
Platelets: 270 10*3/uL (ref 150–400)
RBC: 4.34 MIL/uL (ref 3.87–5.11)
RDW: 11.9 % (ref 11.5–15.5)
WBC: 5.4 10*3/uL (ref 4.0–10.5)
nRBC: 0 % (ref 0.0–0.2)

## 2019-06-03 MED ORDER — LETROZOLE 2.5 MG PO TABS: 2.5000 mg | ORAL_TABLET | Freq: Every day | ORAL | 6 refills | Status: DC

## 2019-06-03 NOTE — Progress Notes (Signed)
Hematology/Oncology follow up  note [facility] [phone] [phone]   Patient Care Team: [name], MD as PCP - General (Obstetrics and Gynecology)  REFERRING PROVIDER: Dr.Cooper. REASON FOR VISIT Follow up for treatment of :  DCIS  HISTORY OF PRESENTING ILLNESS:  Denise Reyes is a  53 y.o.  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   Patient had a mammogram done in May 2019.  Mammogram showed indeterminate calcifications in the upper outer quadrant of the left breast for which biopsy is indicated.  Patient is status post stereotactic guided core biopsy of the left breast calcifications. 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   Patient underwent left breast lumpectomy on 01/23/2018, pathology showed fibrocystic change with microcalcifications Patient was referred to oncology for further evaluation and management.   # Patient's case was discussed at breast tumor board on 02/11/2018. patient that although her DCIS foci is very small, her breast has very proliferative background, consistent with hormone replacement treatment.  Status post adjuvant RT completed in September 2019. Letrozole was started in September 2019   # Postmenopausal status: obtained patient's GYN operation report from Dr. Louann Liv office. Patient is in menopasue state as she had  Patient has had bilateral salpingo-oophorectomy and hysterectomy on 12/30/2013. Pathology reviewed.  Showed benign essentially unremarkable cervix, benign proliferative endometrium associated with benign endometrial polyp.  Adenomyosis, leiomyomata, benign right ovary with small foci of endometriosis, benign right paratubal cyst essentially unremarkable left ovary and the fallopian tube.  Marland Kitchen #We  obtained her records from GYN office and reviewed. DEXA done in May 2019 showed left femoral neck T score -1.1, AP spine -0.3, right femoral neck -1.1  #September 2019, started on letrozole  INTERVAL HISTORY Denise Reyes is a 53 y.o. female who has above history reviewed by me today presents for management of DCIS. Patient reports feeling well at baseline.  No new complaints. Particular concern of her breast. Chronic hot flashes and joint pain, coping with the symptoms. Last Zometa was 03/12/2019.  Review of Systems  Constitutional: Negative for chills, fever, malaise/fatigue and weight loss.  HENT: Negative for nosebleeds and sore throat.   Eyes: Negative for double vision, photophobia and redness.  Respiratory: Negative for cough, shortness of breath and wheezing.   Cardiovascular: Negative for chest pain, palpitations and orthopnea.  Gastrointestinal: Negative for abdominal pain, blood in stool, nausea and vomiting.  Genitourinary: Negative for dysuria.  Musculoskeletal: Positive for joint pain. Negative for back pain, myalgias and neck pain.  Skin: Negative for itching and rash.  Neurological: Negative for dizziness, tingling and tremors.  Endo/Heme/Allergies: Negative for environmental allergies. Does not bruise/bleed easily.       Hot flash  Psychiatric/Behavioral: Negative for depression.    MEDICAL HISTORY:  Past Medical History:  Diagnosis Date  . Allergy    PT CURRENTLY TAKING ABX AND PREDNISONE GIVEN BY DR Tami Ribas ON 01-16-18 FOR INFECTION THAT SHE GETS THIS TIME EVERY YEAR  . Anxiety   . Arthritis    knees, wrists  . Asthma    allergy induced   . Breast cancer ([place])   . Cancer ([place])   . GERD (gastroesophageal reflux disease)    occ-no meds  . History of kidney stones    h/o  . Personal history of radiation therapy 2019  LEFT lumpectomy  . Problems with swallowing and mastication   . Special screening for malignant neoplasms, colon   . Wears contact lenses      SURGICAL HISTORY: Past Surgical History:  Procedure Laterality Date  . BILATERAL SALPINGOOPHORECTOMY Bilateral 12/30/2013  . BREAST BIOPSY Left    DUCTAL CARCINOMA IN SITU (DCIS), LOW NUCLEAR GRADE.  Marland Kitchen BREAST BIOPSY Left 01/23/2018   Procedure: BREAST BIOPSY/PARTIAL MASTECTOMY WITH NEEDLE LOCALIZATION;  Surgeon: Florene Glen, MD;  Location: [facility];  Service: General;  Laterality: Left;  . BREAST EXCISIONAL BIOPSY Left 01/23/2018   lumectomy with NL   . BREAST LUMPECTOMY Left 12/2017   DUCTAL CARCINOMA IN SITU  . COLONOSCOPY WITH PROPOFOL N/A 07/07/2016   Procedure: COLONOSCOPY WITH PROPOFOL;  Surgeon: Lucilla Lame, MD;  Location: [place];  Service: Endoscopy;  Laterality: N/A;  . ESOPHAGOGASTRODUODENOSCOPY (EGD) WITH PROPOFOL N/A 07/07/2016   Procedure: ESOPHAGOGASTRODUODENOSCOPY (EGD) WITH PROPOFOL;  Surgeon: Lucilla Lame, MD;  Location: [place];  Service: Endoscopy;  Laterality: N/A;  . LAPAROSCOPIC ASSISTED VAGINAL HYSTERECTOMY  12/30/2013  . URETHRA SURGERY     52 months old    SOCIAL HISTORY: Social History   Socioeconomic History  . Marital status: Single    Spouse name: Not on file  . Number of children: Not on file  . Years of education: Not on file  . Highest education level: Not on file  Occupational History  . Not on file  Social Needs  . Financial resource strain: Not on file  . Food insecurity    Worry: Not on file    Inability: Not on file  . Transportation needs    Medical: Not on file    Non-medical: Not on file  Tobacco Use  . Smoking status: Current Every Day Smoker    Packs/day: 1.00    Years: 35.00    Pack years: 35.00    Types: Cigarettes  . Smokeless tobacco: Never Used  Substance and Sexual Activity  . Alcohol use: Yes    Alcohol/week: 2.0 standard drinks    Types: 2 Cans of beer per week    Comment: occ weekend beers  . Drug use: No  . Sexual activity: Not on file  Lifestyle  . Physical activity    Days per  week: Not on file    Minutes per session: Not on file  . Stress: Not on file  Relationships  . Social Herbalist on phone: Not on file    Gets together: Not on file    Attends religious service: Not on file    Active member of club or organization: Not on file    Attends meetings of clubs or organizations: Not on file    Relationship status: Not on file  . Intimate partner violence    Fear of current or ex partner: Not on file    Emotionally abused: Not on file    Physically abused: Not on file    Forced sexual activity: Not on file  Other Topics Concern  . Not on file  Social History Narrative  . Not on file    FAMILY HISTORY: Family History  Problem Relation Age of Onset  . Hypertension Mother   . High Cholesterol Mother   . Hypertension Father   . Heart disease Father   . COPD Father   . High Cholesterol Father   . Heart disease Paternal Grandfather   . Cancer Sister  ovarian  . Cancer Maternal Aunt   . Cancer Paternal Grandmother 75       breast  . Breast cancer Paternal Grandmother     ALLERGIES:  has No Known Allergies.  MEDICATIONS:  Current Outpatient Medications  Medication Sig Dispense Refill  . albuterol (PROVENTIL HFA;VENTOLIN HFA) 108 (90 Base) MCG/ACT inhaler Inhale 2 puffs into the lungs 2 (two) times daily as needed for wheezing or shortness of breath.   4  . ALPRAZolam (XANAX) 0.5 MG tablet Take 0.5 mg by mouth 3 (three) times daily as needed for anxiety.    Marland Kitchen atorvastatin (LIPITOR) 20 MG tablet Take 20 mg by mouth every evening.    . calcium-vitamin D (OSCAL WITH D) 500-200 MG-UNIT tablet Take 1 tablet by mouth daily with breakfast.    . cetirizine-pseudoephedrine (ZYRTEC-D) 5-120 MG tablet Take 1 tablet by mouth 2 (two) times daily as needed.     . cyanocobalamin 1000 MCG tablet Take 1,000 mcg by mouth daily.    . Omega-3 Fatty Acids (OMEGA-3 FISH OIL PO) Take by mouth daily.    . vitamin C (ASCORBIC ACID) 500 MG tablet Take 500  mg by mouth daily.    Marland Kitchen letrozole (FEMARA) 2.5 MG tablet Take 1 tablet (2.5 mg total) by mouth daily. 30 tablet 6   No current facility-administered medications for this visit.      PHYSICAL EXAMINATION: ECOG PERFORMANCE STATUS: 0 - Asymptomatic Vitals:   06/03/19 0950  BP: 112/78  Pulse: (!) 103  Resp: 18  Temp: 98.8 F (37.1 C)   Filed Weights   06/03/19 0950  Weight: 131 lb 12.8 oz (59.8 kg)    Physical Exam Constitutional:      General: She is not in acute distress. HENT:     Head: Normocephalic and atraumatic.  Eyes:     General: No scleral icterus.    Conjunctiva/sclera: Conjunctivae normal.     Pupils: Pupils are equal, round, and reactive to light.  Neck:     Musculoskeletal: Normal range of motion and neck supple.  Cardiovascular:     Rate and Rhythm: Normal rate and regular rhythm.     Heart sounds: Normal heart sounds.  Pulmonary:     Effort: Pulmonary effort is normal. No respiratory distress.     Breath sounds: Normal breath sounds. No wheezing or rales.  Chest:     Chest wall: No tenderness.  Abdominal:     General: Bowel sounds are normal. There is no distension.     Palpations: Abdomen is soft. There is no mass.     Tenderness: There is no abdominal tenderness.  Musculoskeletal: Normal range of motion.        General: No deformity.  Lymphadenopathy:     Cervical: No cervical adenopathy.  Skin:    General: Skin is warm and dry.     Findings: No erythema or rash.  Neurological:     Mental Status: She is alert and oriented to person, place, and time.     Cranial Nerves: No cranial nerve deficit.     Coordination: Coordination normal.  Psychiatric:        Behavior: Behavior normal.        Thought Content: Thought content normal.       LABORATORY DATA:  I have reviewed the data as listed Lab Results  Component Value Date   WBC 5.4 06/03/2019   HGB 13.8 06/03/2019   HCT 40.3 06/03/2019   MCV 92.9 06/03/2019   PLT  270 06/03/2019    Recent Labs    11/27/18 1321 03/04/19 1308 06/03/19 0938  NA 140 138 138  K 4.3 3.7 4.3  CL 105 105 105  CO2 27 26 25   GLUCOSE 71 101* 96  BUN 14 12 12   CREATININE 0.63 0.63 0.66  CALCIUM 9.4 9.1 9.1  GFRNONAA >60 >60 >60  GFRAA >60 >60 >60  PROT 7.0 6.8 6.8  ALBUMIN 4.0 4.1 4.0  AST 20 20 21   ALT 18 19 21   ALKPHOS 44 48 45  BILITOT 0.5 0.7 0.7   Iron/TIBC/Ferritin/ %Sat No results found for: IRON, TIBC, FERRITIN, IRONPCTSAT   RADIOGRAPHIC STUDIES: I have personally reviewed the radiological images as listed and agreed with the findings in the report. 12/11/2017  1. Benign RIGHT breast calcifications. 2. Indeterminate calcifications in the UPPER OUTER QUADRANT of the LEFT breast for which biopsy is indicated. 3. No sonographic or mammographic abnormality in the 4 o'clock location area of clinical concern. 07/25/2018  Diagnostic mammogram showed no mammographic evidence of breast malignancy.  Recommend bilateral diagnostic mammogram in 6 months to resume annual mammogram scheduled. 01/23/2019 bilateral diagnostic mammogram showed no mammographic evidence of malignancy.  ASSESSMENT & PLAN:  1. Ductal carcinoma in situ (DCIS) of left breast   2. Osteopenia, unspecified location   3. Aromatase inhibitor use    # DCIS s/p lumpectomy and adjuvant RT.  Tolerating letrozole daily with manageable side effects. Continue, plan for total 5 years. She is due for mammogram in June 2021.  # Osteopenia,  Zometa 4 mg every 6 months.   Last Zometa 03/12/2019.  Due for Zometa at the next visit in 3 months. Repeat bone density every 2 years.  Continue calcium and vitamin D supplements.  Orders Placed This Encounter  Procedures  . CBC with Differential/Platelet    Standing Status:   Future    Standing Expiration Date:   06/02/2020  . Comprehensive metabolic panel    Standing Status:   Future    Standing Expiration Date:   06/02/2020    All questions were answered. The patient knows  to call the clinic with any problems questions or concerns.  Return of visit: 4 months  Earlie Server, MD, PhD Hematology Oncology [facility] Pager- [phone] 06/03/2019

## 2019-09-02 ENCOUNTER — Encounter: Payer: Self-pay | Admitting: Oncology

## 2019-09-02 ENCOUNTER — Other Ambulatory Visit: Payer: Self-pay

## 2019-09-02 ENCOUNTER — Inpatient Hospital Stay: Payer: 59

## 2019-09-02 ENCOUNTER — Inpatient Hospital Stay: Payer: 59 | Attending: Oncology

## 2019-09-02 ENCOUNTER — Inpatient Hospital Stay (HOSPITAL_BASED_OUTPATIENT_CLINIC_OR_DEPARTMENT_OTHER): Payer: 59 | Admitting: Oncology

## 2019-09-02 VITALS — BP 103/71 | HR 98 | Resp 18

## 2019-09-02 VITALS — BP 102/69 | HR 108 | Temp 98.4°F | Resp 16 | Wt 141.6 lb

## 2019-09-02 DIAGNOSIS — D0512 Intraductal carcinoma in situ of left breast: Secondary | ICD-10-CM | POA: Diagnosis present

## 2019-09-02 DIAGNOSIS — M8580 Other specified disorders of bone density and structure, unspecified site: Secondary | ICD-10-CM

## 2019-09-02 DIAGNOSIS — Z79811 Long term (current) use of aromatase inhibitors: Secondary | ICD-10-CM

## 2019-09-02 LAB — COMPREHENSIVE METABOLIC PANEL
ALT: 19 U/L (ref 0–44)
AST: 20 U/L (ref 15–41)
Albumin: 4.1 g/dL (ref 3.5–5.0)
Alkaline Phosphatase: 50 U/L (ref 38–126)
Anion gap: 7 (ref 5–15)
BUN: 8 mg/dL (ref 6–20)
CO2: 27 mmol/L (ref 22–32)
Calcium: 9.1 mg/dL (ref 8.9–10.3)
Chloride: 104 mmol/L (ref 98–111)
Creatinine, Ser: 0.6 mg/dL (ref 0.44–1.00)
GFR calc Af Amer: >60 mL/min (ref >60)
GFR calc non Af Amer: >60 mL/min (ref >60)
Glucose, Bld: 80 mg/dL (ref 70–99)
Potassium: 3.6 mmol/L (ref 3.5–5.1)
Sodium: 138 mmol/L (ref 135–145)
Total Bilirubin: 0.5 mg/dL (ref 0.3–1.2)
Total Protein: 6.9 g/dL (ref 6.5–8.1)

## 2019-09-02 LAB — CBC WITH DIFFERENTIAL/PLATELET
Abs Immature Granulocytes: 0.02 10*3/uL (ref 0.00–0.07)
Basophils Absolute: 0.1 10*3/uL (ref 0.0–0.1)
Basophils Relative: 1 %
Eosinophils Absolute: 0.1 10*3/uL (ref 0.0–0.5)
Eosinophils Relative: 1 %
HCT: 39.9 % (ref 36.0–46.0)
Hemoglobin: 13.5 g/dL (ref 12.0–15.0)
Immature Granulocytes: 0 %
Lymphocytes Relative: 35 %
Lymphs Abs: 2.5 10*3/uL (ref 0.7–4.0)
MCH: 31.7 pg (ref 26.0–34.0)
MCHC: 33.8 g/dL (ref 30.0–36.0)
MCV: 93.7 fL (ref 80.0–100.0)
Monocytes Absolute: 0.5 10*3/uL (ref 0.1–1.0)
Monocytes Relative: 7 %
Neutro Abs: 3.9 10*3/uL (ref 1.7–7.7)
Neutrophils Relative %: 56 %
Platelets: 278 10*3/uL (ref 150–400)
RBC: 4.26 MIL/uL (ref 3.87–5.11)
RDW: 12.1 % (ref 11.5–15.5)
WBC: 7.1 10*3/uL (ref 4.0–10.5)
nRBC: 0 % (ref 0.0–0.2)

## 2019-09-02 MED ORDER — ZOLEDRONIC ACID 4 MG/100ML IV SOLN
4.0000 mg | Freq: Once | INTRAVENOUS | Status: AC
  Administered 2019-09-02: 4 mg via INTRAVENOUS
  Filled 2019-09-02: qty 100

## 2019-09-02 MED ORDER — SODIUM CHLORIDE 0.9 % IV SOLN
Freq: Once | INTRAVENOUS | Status: AC
  Filled 2019-09-02: qty 250

## 2019-09-02 NOTE — Progress Notes (Signed)
Hematology/Oncology follow up  note [facility] [phone] [phone]) [phone]   Patient Care Team: [name], MD as PCP - General (Obstetrics and Gynecology)  REFERRING PROVIDER: Dr.Cooper. REASON FOR VISIT Follow up for treatment of :  DCIS  HISTORY OF PRESENTING ILLNESS:  Denise Reyes is a  54 y.o.  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   Patient had a mammogram done in May 2019.  Mammogram showed indeterminate calcifications in the upper outer quadrant of the left breast for which biopsy is indicated.  Patient is status post stereotactic guided core biopsy of the left breast calcifications. 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   Patient underwent left breast lumpectomy on 01/23/2018, pathology showed fibrocystic change with microcalcifications Patient was referred to oncology for further evaluation and management.   # Patient's case was discussed at breast tumor board on 02/11/2018. patient that although her DCIS foci is very small, her breast has very proliferative background, consistent with hormone replacement treatment.  Status post adjuvant RT completed in September 2019. Letrozole was started in September 2019   # Postmenopausal status: obtained patient's GYN operation report from Dr. Louann Liv office. Patient is in menopasue state as she had  Patient has had bilateral salpingo-oophorectomy and hysterectomy on 12/30/2013. Pathology reviewed.  Showed benign essentially unremarkable cervix, benign proliferative endometrium associated with benign endometrial polyp.  Adenomyosis, leiomyomata, benign right ovary with small foci of endometriosis, benign right paratubal cyst essentially unremarkable left ovary and the fallopian tube.  Marland Kitchen #We  obtained her records from GYN office and reviewed. DEXA done in May 2019 showed left femoral neck T score -1.1, AP spine -0.3, right femoral neck -1.1  #September 2019, started on letrozole  INTERVAL HISTORY Denise Reyes is a 54 y.o. female who has above history reviewed by me today presents for management of DCIS. Patient reports doing well today.  She has no new complaints today.  No breast concerns. Patient has been on letrozole daily. Manageable chronic hot flash and joint pain.  She is coping with the symptoms. Last Zometa was 03/12/2019.  Review of Systems  Constitutional: Negative for chills, fever, malaise/fatigue and weight loss.  HENT: Negative for nosebleeds and sore throat.   Eyes: Negative for double vision, photophobia and redness.  Respiratory: Negative for cough, shortness of breath and wheezing.   Cardiovascular: Negative for chest pain, palpitations, orthopnea and leg swelling.  Gastrointestinal: Negative for abdominal pain, blood in stool, nausea and vomiting.  Genitourinary: Negative for dysuria.  Musculoskeletal: Positive for joint pain. Negative for back pain, myalgias and neck pain.  Skin: Negative for itching and rash.  Neurological: Negative for dizziness, tingling and tremors.  Endo/Heme/Allergies: Negative for environmental allergies. Does not bruise/bleed easily.       Hot flash  Psychiatric/Behavioral: Negative for depression and hallucinations.    MEDICAL HISTORY:  Past Medical History:  Diagnosis Date  . Allergy    PT CURRENTLY TAKING ABX AND PREDNISONE GIVEN BY DR Tami Ribas ON 01-16-18 FOR INFECTION THAT SHE GETS THIS TIME EVERY YEAR  . Anxiety   . Arthritis    knees, wrists  . Asthma    allergy induced   . Breast cancer ([place])   . Cancer ([place])   . GERD (gastroesophageal reflux disease)    occ-no meds  . History of  kidney stones    h/o  . Personal history of radiation therapy 2019   LEFT lumpectomy  . Problems with swallowing and mastication    . Special screening for malignant neoplasms, colon   . Wears contact lenses     SURGICAL HISTORY: Past Surgical History:  Procedure Laterality Date  . BILATERAL SALPINGOOPHORECTOMY Bilateral 12/30/2013  . BREAST BIOPSY Left    DUCTAL CARCINOMA IN SITU (DCIS), LOW NUCLEAR GRADE.  Marland Kitchen BREAST BIOPSY Left 01/23/2018   Procedure: BREAST BIOPSY/PARTIAL MASTECTOMY WITH NEEDLE LOCALIZATION;  Surgeon: Florene Glen, MD;  Location: [facility];  Service: General;  Laterality: Left;  . BREAST EXCISIONAL BIOPSY Left 01/23/2018   lumectomy with NL   . BREAST LUMPECTOMY Left 12/2017   DUCTAL CARCINOMA IN SITU  . COLONOSCOPY WITH PROPOFOL N/A 07/07/2016   Procedure: COLONOSCOPY WITH PROPOFOL;  Surgeon: Lucilla Lame, MD;  Location: [place];  Service: Endoscopy;  Laterality: N/A;  . ESOPHAGOGASTRODUODENOSCOPY (EGD) WITH PROPOFOL N/A 07/07/2016   Procedure: ESOPHAGOGASTRODUODENOSCOPY (EGD) WITH PROPOFOL;  Surgeon: Lucilla Lame, MD;  Location: [place];  Service: Endoscopy;  Laterality: N/A;  . LAPAROSCOPIC ASSISTED VAGINAL HYSTERECTOMY  12/30/2013  . URETHRA SURGERY     21 months old    SOCIAL HISTORY: Social History   Socioeconomic History  . Marital status: Single    Spouse name: Not on file  . Number of children: Not on file  . Years of education: Not on file  . Highest education level: Not on file  Occupational History  . Not on file  Tobacco Use  . Smoking status: Current Every Day Smoker    Packs/day: 1.00    Years: 35.00    Pack years: 35.00    Types: Cigarettes  . Smokeless tobacco: Never Used  Substance and Sexual Activity  . Alcohol use: Yes    Alcohol/week: 2.0 standard drinks    Types: 2 Cans of beer per week    Comment: occ weekend beers  . Drug use: No  . Sexual activity: Not on file  Other Topics Concern  . Not on file  Social History Narrative  . Not on file   Social Determinants of Health   Financial Resource Strain:   . Difficulty of Paying  Living Expenses: Not on file  Food Insecurity:   . Worried About Charity fundraiser in the Last Year: Not on file  . Ran Out of Food in the Last Year: Not on file  Transportation Needs:   . Lack of Transportation (Medical): Not on file  . Lack of Transportation (Non-Medical): Not on file  Physical Activity:   . Days of Exercise per Week: Not on file  . Minutes of Exercise per Session: Not on file  Stress:   . Feeling of Stress : Not on file  Social Connections:   . Frequency of Communication with Friends and Family: Not on file  . Frequency of Social Gatherings with Friends and Family: Not on file  . Attends Religious Services: Not on file  . Active Member of Clubs or Organizations: Not on file  . Attends Archivist Meetings: Not on file  . Marital Status: Not on file  Intimate Partner Violence:   . Fear of Current or Ex-Partner: Not on file  . Emotionally Abused: Not on file  . Physically Abused: Not on file  . Sexually Abused: Not on file    FAMILY HISTORY: Family History  Problem Relation Age of Onset  . Hypertension Mother   .  High Cholesterol Mother   . Hypertension Father   . Heart disease Father   . COPD Father   . High Cholesterol Father   . Heart disease Paternal Grandfather   . Cancer Sister        ovarian  . Cancer Maternal Aunt   . Cancer Paternal Grandmother 21       breast  . Breast cancer Paternal Grandmother     ALLERGIES:  has No Known Allergies.  MEDICATIONS:  Current Outpatient Medications  Medication Sig Dispense Refill  . albuterol (PROVENTIL HFA;VENTOLIN HFA) 108 (90 Base) MCG/ACT inhaler Inhale 2 puffs into the lungs 2 (two) times daily as needed for wheezing or shortness of breath.   4  . ALPRAZolam (XANAX) 0.5 MG tablet Take 0.5 mg by mouth 3 (three) times daily as needed for anxiety.    Marland Kitchen atorvastatin (LIPITOR) 20 MG tablet Take 20 mg by mouth every evening.    . calcium-vitamin D (OSCAL WITH D) 500-200 MG-UNIT tablet Take 1  tablet by mouth daily with breakfast.    . cetirizine-pseudoephedrine (ZYRTEC-D) 5-120 MG tablet Take 1 tablet by mouth 2 (two) times daily as needed.     . cyanocobalamin 1000 MCG tablet Take 1,000 mcg by mouth daily.    Marland Kitchen letrozole (FEMARA) 2.5 MG tablet Take 1 tablet (2.5 mg total) by mouth daily. 30 tablet 6  . Omega-3 Fatty Acids (OMEGA-3 FISH OIL PO) Take by mouth daily.    . vitamin C (ASCORBIC ACID) 500 MG tablet Take 500 mg by mouth daily.     No current facility-administered medications for this visit.     PHYSICAL EXAMINATION: ECOG PERFORMANCE STATUS: 0 - Asymptomatic Vitals:   09/02/19 1319  BP: 102/69  Pulse: (!) 108  Resp: 16  Temp: 98.4 F (36.9 C)   Filed Weights   09/02/19 1319  Weight: 141 lb 9.6 oz (64.2 kg)    Physical Exam Constitutional:      General: She is not in acute distress. HENT:     Head: Normocephalic and atraumatic.  Eyes:     General: No scleral icterus.    Conjunctiva/sclera: Conjunctivae normal.     Pupils: Pupils are equal, round, and reactive to light.  Cardiovascular:     Rate and Rhythm: Normal rate and regular rhythm.     Heart sounds: Normal heart sounds.  Pulmonary:     Effort: Pulmonary effort is normal. No respiratory distress.     Breath sounds: Normal breath sounds. No wheezing or rales.  Chest:     Chest wall: No tenderness.  Abdominal:     General: Bowel sounds are normal. There is no distension.     Palpations: Abdomen is soft. There is no mass.     Tenderness: There is no abdominal tenderness.  Musculoskeletal:        General: No deformity. Normal range of motion.     Cervical back: Normal range of motion and neck supple.  Lymphadenopathy:     Cervical: No cervical adenopathy.  Skin:    General: Skin is warm and dry.     Findings: No erythema or rash.  Neurological:     Mental Status: She is alert and oriented to person, place, and time. Mental status is at baseline.     Cranial Nerves: No cranial nerve  deficit.     Coordination: Coordination normal.  Psychiatric:        Mood and Affect: Mood normal.  Behavior: Behavior normal.        Thought Content: Thought content normal.   Patient defers breast examination.  She has no concerns of her breast    LABORATORY DATA:  I have reviewed the data as listed Lab Results  Component Value Date   WBC 7.1 09/02/2019   HGB 13.5 09/02/2019   HCT 39.9 09/02/2019   MCV 93.7 09/02/2019   PLT 278 09/02/2019   Recent Labs    03/04/19 1308 06/03/19 0938 09/02/19 1305  NA 138 138 138  K 3.7 4.3 3.6  CL 105 105 104  CO2 26 25 27   GLUCOSE 101* 96 80  BUN 12 12 8   CREATININE 0.63 0.66 0.60  CALCIUM 9.1 9.1 9.1  GFRNONAA >60 >60 >60  GFRAA >60 >60 >60  PROT 6.8 6.8 6.9  ALBUMIN 4.1 4.0 4.1  AST 20 21 20   ALT 19 21 19   ALKPHOS 48 45 50  BILITOT 0.7 0.7 0.5   Iron/TIBC/Ferritin/ %Sat No results found for: IRON, TIBC, FERRITIN, IRONPCTSAT   RADIOGRAPHIC STUDIES: I have personally reviewed the radiological images as listed and agreed with the findings in the report. 12/11/2017  1. Benign RIGHT breast calcifications. 2. Indeterminate calcifications in the UPPER OUTER QUADRANT of the LEFT breast for which biopsy is indicated. 3. No sonographic or mammographic abnormality in the 4 o'clock location area of clinical concern. 07/25/2018  Diagnostic mammogram showed no mammographic evidence of breast malignancy.  Recommend bilateral diagnostic mammogram in 6 months to resume annual mammogram scheduled. 01/23/2019 bilateral diagnostic mammogram showed no mammographic evidence of malignancy.  ASSESSMENT & PLAN:  1. Ductal carcinoma in situ (DCIS) of left breast   2. Osteopenia, unspecified location   3. Aromatase inhibitor use    # DCIS s/p lumpectomy and adjuvant RT.  Clinically patient is doing well.   Labs are reviewed and discussed with patient. Continue letrozole for a total of 5 years. She is due for bilateral diagnostic  mammogram in June 2021. Patient prefers to have mammogram done through her gynecologist office.  #Osteopenia in the context of chronic aromatase inhibitor use. Has been on Zometa every 6 months.  Tolerating well. Recommend patient to continue calcium and vitamin D supplementation.  Proceed with Zometa today Patient will need to repeat bone density every 2 years.  She will get it done through gynecologist office.   Orders Placed This Encounter  Procedures  . CBC with Differential/Platelet    Standing Status:   Future    Standing Expiration Date:   09/01/2020  . Comprehensive metabolic panel    Standing Status:   Future    Standing Expiration Date:   09/01/2020    All questions were answered. The patient knows to call the clinic with any problems questions or concerns.  Return of visit: 6 months.  Earlie Server, MD, PhD Hematology Oncology [facility] at [facility] Pager- [phone] 09/02/2019

## 2019-11-13 IMAGING — MG NEEDLE LOCALIZATION OF THE LEFT BREAST WITH MAMMO GUIDANCE
8 of 9 series · 8 of 9 positions shown · non-contrast
Comparison: Previous exams.

CLINICAL DATA: Preoperative wire localization, prior to left breast
lumpectomy for DCIS.

EXAM:
NEEDLE LOCALIZATION OF THE LEFT BREAST WITH MAMMO GUIDANCE

[L LM (1 of 2)]
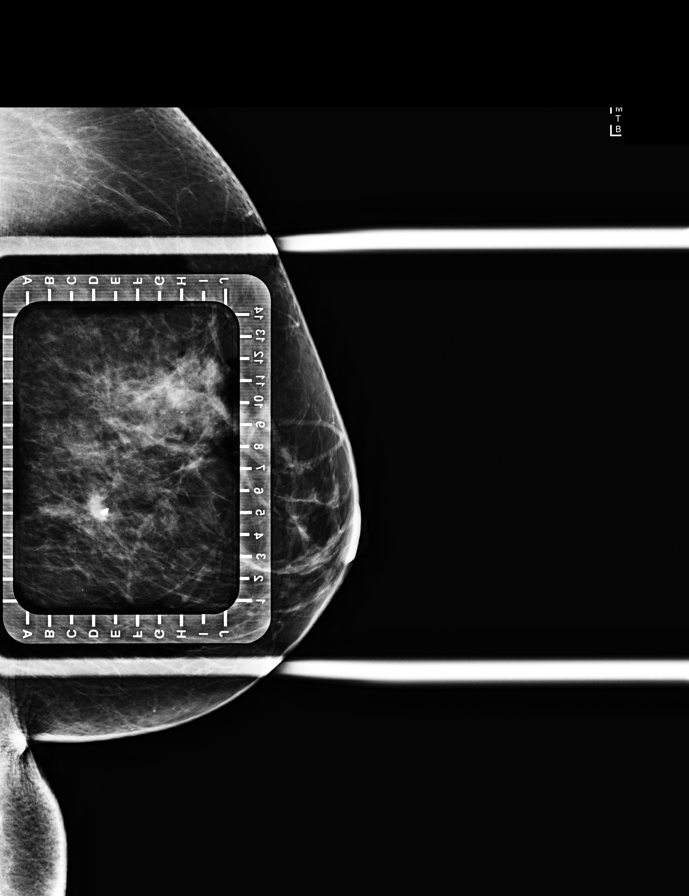

[L CC (1 of 6)]
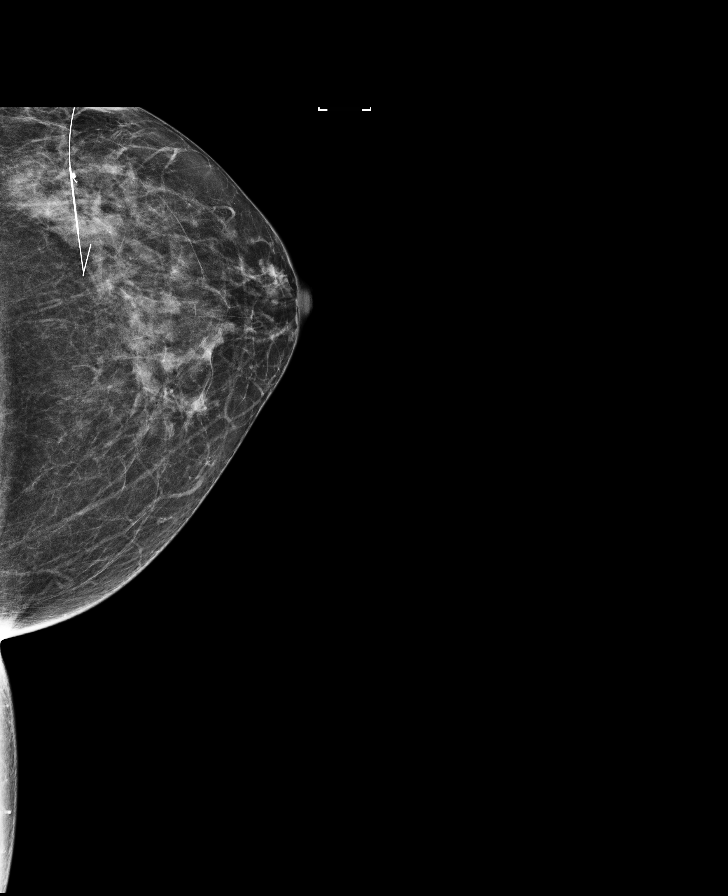

[L CC (2 of 6)]
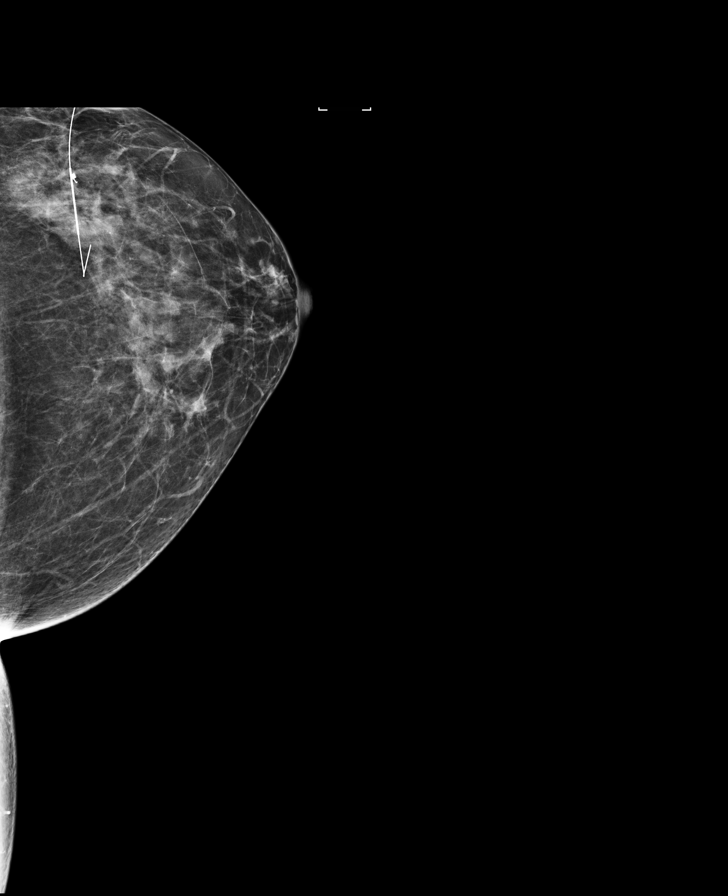

[L CC (3 of 6)]
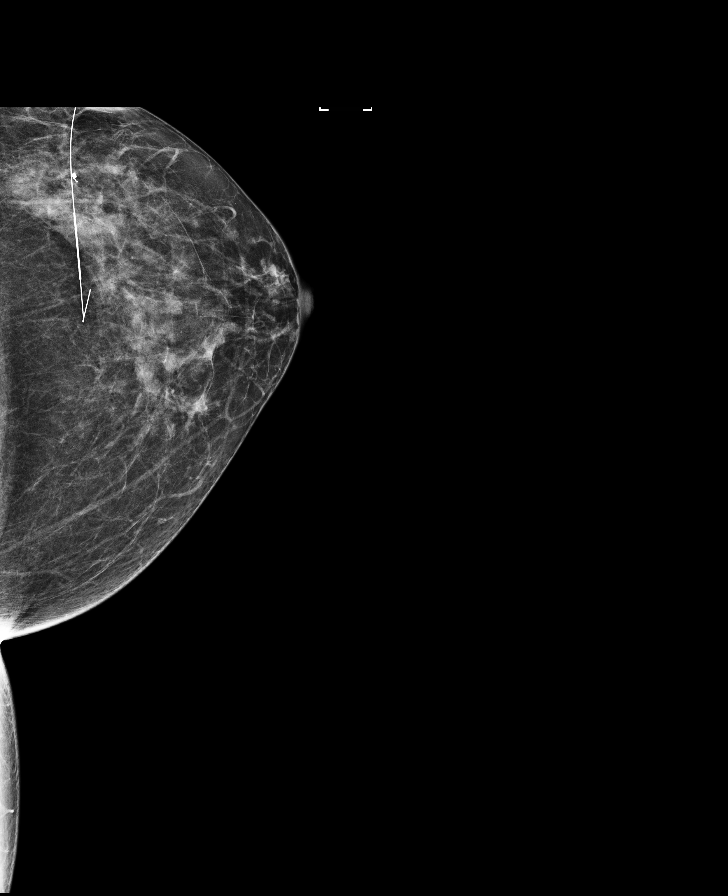

[L CC (4 of 6)]
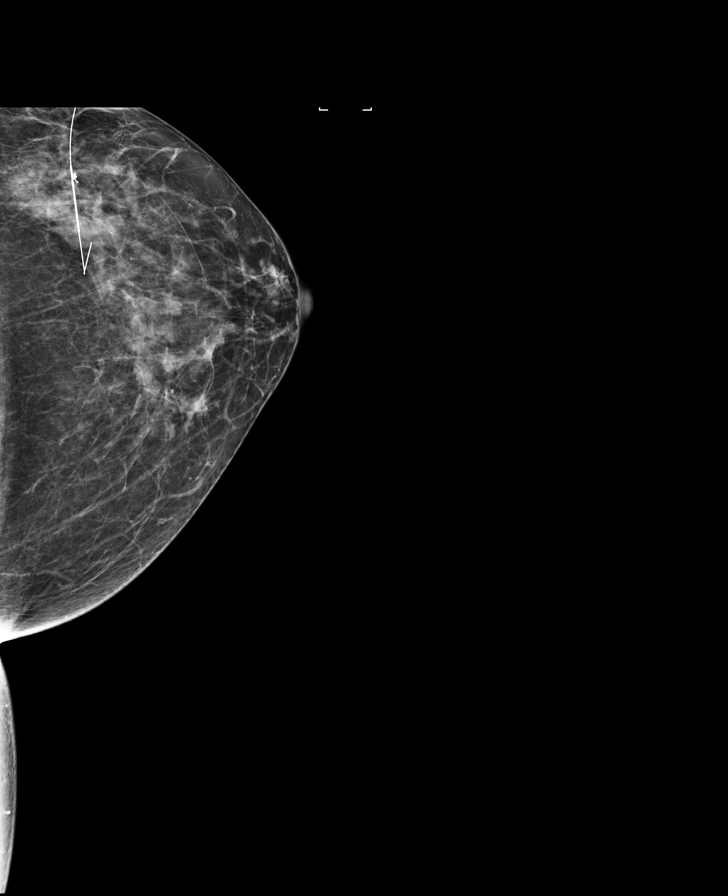

[L CC (5 of 6)]
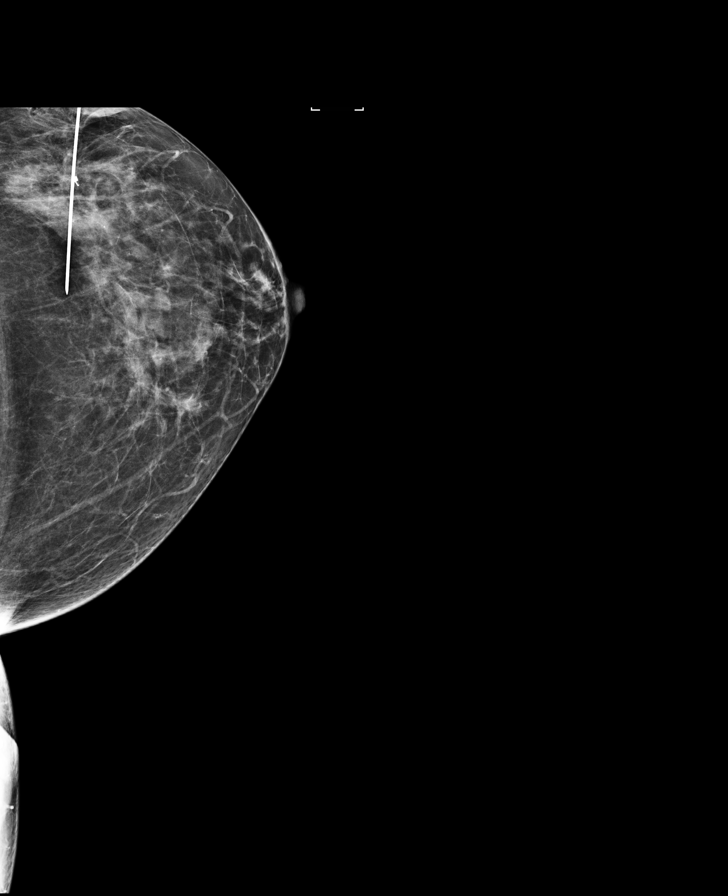

[L CC (6 of 6)]
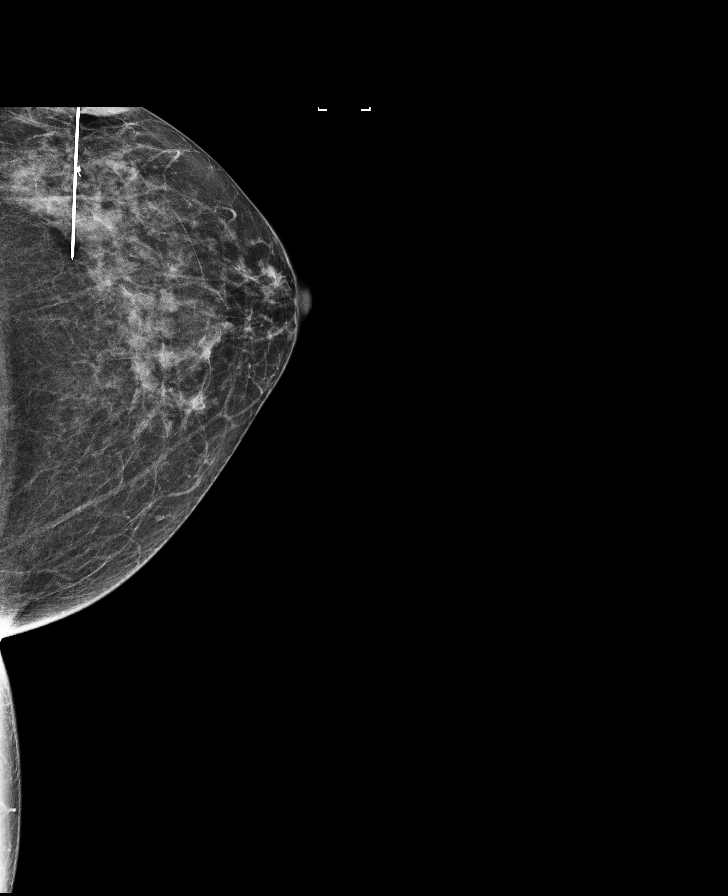

[L LM (2 of 2)]
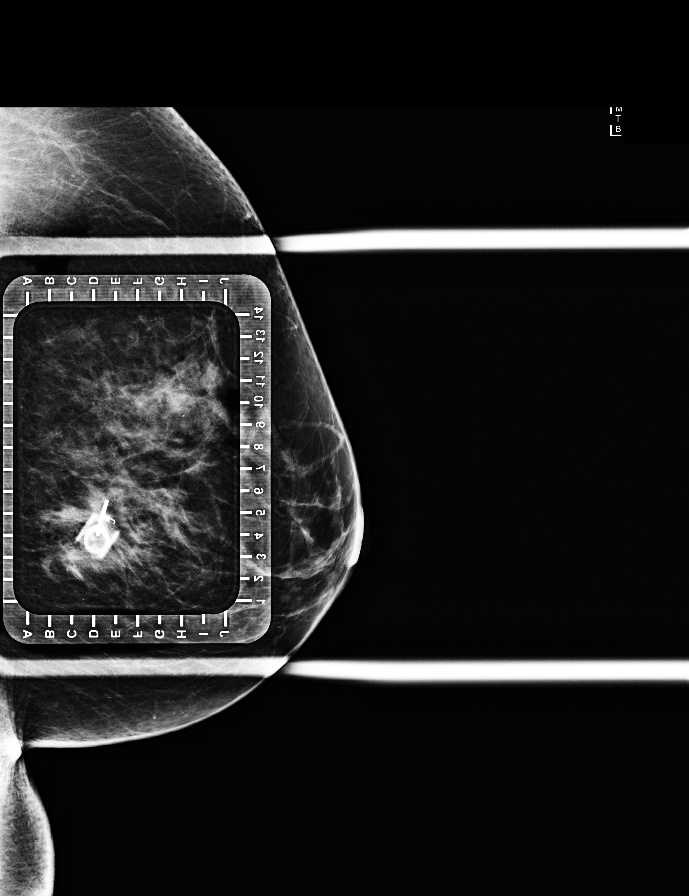

[8 of 9 positions shown; findings below may reference images not displayed]

FINDINGS: Patient presents for needle localization prior to left breast
lumpectomy. I met with the patient and we discussed the procedure of
needle localization including benefits and alternatives. We
discussed the high likelihood of a successful procedure. We
discussed the risks of the procedure, including infection, bleeding,
tissue injury, and further surgery. Informed, written consent was
given. The usual time-out protocol was performed immediately prior
to the procedure.

Using mammographic guidance, sterile technique, 1% lidocaine and a 7
cm modified Kopans needle, a coil shaped marker and residual
calcifications in the slightly lower outer left breast, posterior
depth, or localized using lateral approach. The distance from the
skin entrance site of the wire to coil shaped post biopsy marker is
2.8 cm. The marker is located at the proximal reinforced portion of
the wire. Residual calcifications are seen along the reinforced
portion of the wire. The images were marked for Dr. Carreon.
IMPRESSION: Needle localization left breast. No apparent complications.

## 2019-11-25 ENCOUNTER — Encounter: Payer: Self-pay | Admitting: Oncology

## 2019-11-25 DIAGNOSIS — D0512 Intraductal carcinoma in situ of left breast: Secondary | ICD-10-CM

## 2019-11-28 NOTE — Telephone Encounter (Signed)
Done..  Bilateral Diagnostic Mammogram @ [facility]  has been scheduled as requested. Appt date is 01/27/20 @ 11:20am

## 2019-12-17 ENCOUNTER — Other Ambulatory Visit: Payer: Self-pay | Admitting: *Deleted

## 2019-12-17 ENCOUNTER — Other Ambulatory Visit: Payer: Self-pay

## 2019-12-17 ENCOUNTER — Ambulatory Visit (INDEPENDENT_AMBULATORY_CARE_PROVIDER_SITE_OTHER): Payer: 59 | Admitting: Allergy

## 2019-12-17 ENCOUNTER — Telehealth: Payer: Self-pay | Admitting: Allergy

## 2019-12-17 ENCOUNTER — Encounter: Payer: Self-pay | Admitting: Allergy

## 2019-12-17 VITALS — BP 118/78 | HR 88 | Temp 98.7°F | Resp 18 | Ht 59.5 in | Wt 133.0 lb

## 2019-12-17 DIAGNOSIS — H1013 Acute atopic conjunctivitis, bilateral: Secondary | ICD-10-CM

## 2019-12-17 DIAGNOSIS — J4489 Other specified chronic obstructive pulmonary disease: Secondary | ICD-10-CM | POA: Insufficient documentation

## 2019-12-17 DIAGNOSIS — R12 Heartburn: Secondary | ICD-10-CM | POA: Diagnosis not present

## 2019-12-17 DIAGNOSIS — J3089 Other allergic rhinitis: Secondary | ICD-10-CM | POA: Diagnosis not present

## 2019-12-17 DIAGNOSIS — J449 Chronic obstructive pulmonary disease, unspecified: Secondary | ICD-10-CM | POA: Diagnosis not present

## 2019-12-17 DIAGNOSIS — Z720 Tobacco use: Secondary | ICD-10-CM | POA: Diagnosis not present

## 2019-12-17 MED ORDER — AZELASTINE-FLUTICASONE 137-50 MCG/ACT NA SUSP
1.0000 | Freq: Two times a day (BID) | NASAL | 5 refills | Status: DC
Start: 2019-12-17 — End: 2021-08-17

## 2019-12-17 MED ORDER — TRELEGY ELLIPTA 100-62.5-25 MCG/INH IN AEPB: 1.0000 | INHALATION_SPRAY | Freq: Every day | RESPIRATORY_TRACT | 5 refills | Status: DC

## 2019-12-17 NOTE — Telephone Encounter (Signed)
Looked up this medication and it is actually a preferred medication for her insurance so unfortunately this inhaler is just expensive regardless. I tried resending in the prescription and attached a coupon card for the Trelegy and sent it back into her pharmacy. Called the patient and advised of coupon and asked for her to call them shortly and ask how much it is with the coupon. I advised that if there are any issues to please call me back. Patient verbalized understanding.

## 2019-12-17 NOTE — Progress Notes (Signed)
New Patient Note  RE: Denise Reyes MRN: [id number] DOB: [birth year] Date of Office Visit: 12/17/2019  Referring provider: None. Primary care provider: Marguerita Merles, MD  Chief Complaint: Allergic Rhinitis  (sneezing, itching eyes. has had a very tough time being off of her antihistamine. ), Cough (seems to be worse this time of year.), and Asthma (has been told in the past it is allergy induced. )  History of Present Illness: I had the pleasure of seeing Denise Reyes for initial evaluation at the [facility] on 12/17/2019. She is a 54 y.o. female, who is self-referred here for the evaluation of allergic rhinitis, asthma.  Rhinitis:  She reports symptoms of sneezing, nasal congestion, rhinorrhea, itchy eyes, coughing. Symptoms have been going on for 5-6 years. The symptoms are present all year around with worsening in spring and fall. Other triggers include exposure to unknown. Anosmia: no. Headache: sometimes. She has used Human resources officer, zyrtec, Zaditor, Flonase with some improvement in symptoms. Uses Nettipot, saline spray and essential oil nasal stick. Sinus infections: no. Previous work up includes: skin testing 2-3 years ago was negative at the ENT office. Previous ENT evaluation: yes. Previous sinus imaging: no. History of nasal polyps: no. Last eye exam: not recently. History of reflux: yes but not taking medications daily. Patient had 1 episode of pill getting stuck in her throat as well.   Coughing:  She reports symptoms of chest tightness, coughing, wheezing for many years. Current medications include albuterol prn which help. She reports not using aerochamber with inhalers. She tried the following inhalers: none. Main triggers are allergies. In the last month, frequency of symptoms: daily. Frequency of nocturnal symptoms: nightly. Frequency of SABA use: few times/week. Interference with physical activity: yes. Sleep is disturbed. In the last 12 months, emergency room  visits/urgent care visits/doctor office visits or hospitalizations due to respiratory issues: 0. In the last 12 months, oral steroids courses: 0. Lifetime history of hospitalization for respiratory issues: 0. Prior intubations: 0. History of pneumonia: no. She was evaluated by pulmonologist in the past. Smoking exposure: 1-1.5 ppd x 40 yrs. Up to date with flu vaccine: no. Up to date with pneumonia vaccine: no. Up to date with COVID-19 vaccine: no   Assessment and Plan: Denise Reyes is a 54 y.o. 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  Currently using albuterol a few times per week with good benefit however she does have daily and nightly symptoms.  Smoking over 1 pack/day for 40+ years.  Today's spirometry was normal with 14% improvement in FEV1 post bronchodilator treatment and clinically feeling improved. . Given her clinical history and spirometry results she most likely has asthma with COPD. . Daily controller medication(s): start Trelegy 1 puff once a day and rinse mouth afterwards.  o Sample given and demonstrated proper use.  . May use albuterol rescue inhaler 2 puffs every 4 to 6 hours as needed for shortness of breath, chest tightness, coughing, and wheezing. May use albuterol rescue inhaler 2 puffs 5 to 15 minutes prior to strenuous physical activities. Monitor frequency of use.  Marland Kitchen Spacer given and demonstrated proper use with inhaler. Patient understood technique and all questions/concerned were addressed.  . Repeat spirometry at next visit. . Consider getting full body PFT as well. . Discussed smoking cessation.  Other allergic rhinitis Perennial rhinoconjunctivitis symptoms with worsening spring and fall.  Tried Allegra, Zyrtec, Holiday representative with some benefit.  Skin testing a few years ago was negative in the ENT office.  11 cats and 2 dogs at home.  Today's intradermal testing showed:  positive to dog and borderline to grass pollen.  Start environmental control measures as below.  May use over the counter antihistamines such as Zyrtec (cetirizine), Claritin (loratadine), Allegra (fexofenadine), or Xyzal (levocetirizine) daily as needed. May take twice a day if needed. Start dymista (fluticasone + azelastine nasal spray combination) 1 spray per nostril twice a day.  This replaces Flonase (fluticasone) for now. If it's not covered let us know.   Nasal saline spray (i.e., Simply Saline) or nasal saline lavage (i.e., NeilMed) is recommended as needed and prior to medicated nasal sprays.  May use Zaditor eye drops 1-2 times a day as needed for itchy/watery eyes.   Do not use directly over contact lens. Wait 10 minutes after application to put lens back in.  Allergic conjunctivitis of both eyes  See assessment and plan as above for allergic rhinitis.  Heartburn Patient does have some heartburn symptoms however not taking any medications for this.  She also had one episode of pill getting stuck in her throat.  Discussed heartburn lifestyle modifications and if no improvement will start on a daily PPI next.  Return in about 2 months (around 02/16/2020).  Meds ordered this encounter  Medications  . Fluticasone-Umeclidin-Vilant (TRELEGY ELLIPTA) 100-62.5-25 MCG/INH AEPB    Sig: Inhale 1 puff into the lungs daily. Rinse mouth after each use.    Dispense:  30 each    Refill:  5  . Azelastine-Fluticasone 137-50 MCG/ACT SUSP    Sig: Place 1 spray into the nose in the morning and at bedtime.    Dispense:  23 g    Refill:  5   Other allergy screening: Food allergy: no Medication allergy: no Hymenoptera allergy: no Urticaria: no Eczema:no History of recurrent infections suggestive of immunodeficency: no  Diagnostics: Spirometry:  Tracings reviewed. Her effort: Good reproducible efforts. FVC: 2.42L FEV1: 1.66L, 71% predicted FEV1/FVC ratio: 69% Interpretation:  Spirometry consistent with normal pattern with 14% improvement in FEV1 post bronchodilator treatment. Clinically feeling improved.  Please see scanned spirometry results for details.  Skin Testing: Environmental allergy panel and select foods. Today's skin testing showed: positive to dog and borderline to grass pollen. Negative to common foods.   Results discussed with patient/family. Airborne Adult Perc - 12/17/19 0900    Time Antigen Placed  1000    Allergen Manufacturer  Lavella Hammock    Location  Back    Number of Test  59    1. Control-Buffer 50% Glycerol  Negative    2. Control-Histamine 1 mg/ml  2+    3. Albumin saline  Negative    4. Lone Oak  Negative    5. [place]  Negative    6. Johnson  Negative    7. Cl[place] Blue  Negative    8. Meadow Fescue  Negative    9. Perennial Rye  Negative    10. Sweet Vernal  Negative    11. Timothy  Negative    12. Cocklebur  Negative    13. Burweed Marshelder  Negative    14. Ragweed, short  Negative    15. Ragweed, Giant  Negative    16. Plantain,  English  Negative    17. Lamb's Quarters  Negative    18. Sheep Sorrell  Negative    19. Rough Pigweed  Negative    20. Skeet Simmer Elder, Rough  Negative  21. Mugwort, Common  Negative    22. Ash mix  Negative    23. Birch mix  Negative    24. Beech American  Negative    25. Box, Elder  Negative    26. Cedar, red  Negative    27. Cottonwood, [place]  Negative    28. Elm mix  Negative    29. Hickory  Negative    30. Maple mix  Negative    31. Oak, [place] mix  Negative    32. Pecan Pollen  Negative    33. Pine mix  Negative    34. Sycamore Eastern  Negative    35. [place], Black Pollen  Negative    36. Alternaria alternata  Negative    37. Cladosporium Herbarum  Negative    38. Aspergillus mix  Negative    39. Penicillium mix  Negative    40. Bipolaris sorokiniana (Helminthosporium)  Negative    41. Drechslera spicifera (Curvularia)  Negative    42. Mucor plumbeus  Negative    43. Fusarium  moniliforme  Negative    44. Aureobasidium pullulans (pullulara)  Negative    45. Rhizopus oryzae  Negative    46. Botrytis cinera  Negative    47. Epicoccum nigrum  Negative    48. Phoma betae  Negative    49. Candida Albicans  Negative    50. Trichophyton mentagrophytes  Negative    51. Mite, D Farinae  5,000 AU/ml  Negative    52. Mite, D Pteronyssinus  5,000 AU/ml  Negative    53. Cat Hair 10,000 BAU/ml  Negative    54.  Dog Epithelia  Negative    55. Mixed Feathers  Negative    56. Horse Epithelia  Negative    57. Cockroach, German  Negative    58. Mouse  Negative    59. Tobacco Leaf  Negative     Food Perc - 12/17/19 0900      Test Information   Time Antigen Placed  1000    Allergen Manufacturer  Greer    Location  Back    Number of allergen test  10      Food   1. Peanut  Negative    2. Soybean food  Negative    3. Wheat, whole  Negative    4. Sesame  Negative    5. Milk, cow  Negative    6. Egg Mccuistion, chicken  Negative    7. Casein  Negative    8. Shellfish mix  Negative    9. Fish mix  Negative    10. Cashew  Negative     Intradermal - 12/17/19 1000    Time Antigen Placed  1028    Allergen Manufacturer  Greer    Location  Arm    Number of Test  15    Control  Negative    [place]  --   +/-   Johnson  Negative    7 Grass  Negative    Ragweed mix  Negative    Weed mix  Negative    Tree mix  Negative    Mold 1  Negative    Mold 2  Negative    Mold 3  Negative    Mold 4  Negative    Cat  Negative    Dog  2+    Cockroach  Negative    Mite mix  Negative       Past Medical History: Patient Active  Problem List   Diagnosis Date Noted  . Other allergic rhinitis 12/17/2019  . Heartburn 12/17/2019  . 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 Tobacco user 12/17/2019  . Allergic conjunctivitis of both eyes 12/17/2019  . Osteopenia 05/28/2018  . Ductal carcinoma in situ (DCIS) of left breast   . Special screening for  malignant neoplasms, colon   . Problems with swallowing and mastication    Past Medical History:  Diagnosis Date  . Allergy    PT CURRENTLY TAKING ABX AND PREDNISONE GIVEN BY DR Tami Ribas ON 01-16-18 FOR INFECTION THAT SHE GETS THIS TIME EVERY YEAR  . Anxiety   . Arthritis    knees, wrists  . Asthma    allergy induced   . Breast cancer ([place])   . Cancer ([place])   . GERD (gastroesophageal reflux disease)    occ-no meds  . History of kidney stones    h/o  . Personal history of radiation therapy 2019   LEFT lumpectomy  . Problems with swallowing and mastication   . Psoriasis   . Recurrent upper respiratory infection (URI)   . Special screening for malignant neoplasms, colon   . Wears contact lenses    Past Surgical History: Past Surgical History:  Procedure Laterality Date  . BILATERAL SALPINGOOPHORECTOMY Bilateral 12/30/2013  . BREAST BIOPSY Left    DUCTAL CARCINOMA IN SITU (DCIS), LOW NUCLEAR GRADE.  Marland Kitchen BREAST BIOPSY Left 01/23/2018   Procedure: BREAST BIOPSY/PARTIAL MASTECTOMY WITH NEEDLE LOCALIZATION;  Surgeon: Florene Glen, MD;  Location: [facility];  Service: General;  Laterality: Left;  . BREAST EXCISIONAL BIOPSY Left 01/23/2018   lumectomy with NL   . BREAST LUMPECTOMY Left 12/2017   DUCTAL CARCINOMA IN SITU  . COLONOSCOPY WITH PROPOFOL N/A 07/07/2016   Procedure: COLONOSCOPY WITH PROPOFOL;  Surgeon: Lucilla Lame, MD;  Location: [facility];  Service: Endoscopy;  Laterality: N/A;  . ESOPHAGOGASTRODUODENOSCOPY (EGD) WITH PROPOFOL N/A 07/07/2016   Procedure: ESOPHAGOGASTRODUODENOSCOPY (EGD) WITH PROPOFOL;  Surgeon: Lucilla Lame, MD;  Location: [facility];  Service: Endoscopy;  Laterality: N/A;  . LAPAROSCOPIC ASSISTED VAGINAL HYSTERECTOMY  12/30/2013  . URETHRA SURGERY     25 months old   Medication List:  Current Outpatient Medications  Medication Sig Dispense Refill  . albuterol (PROVENTIL HFA;VENTOLIN HFA) 108 (90 Base) MCG/ACT inhaler Inhale 2 puffs  into the lungs 2 (two) times daily as needed for wheezing or shortness of breath.   4  . ALPRAZolam (XANAX) 0.5 MG tablet Take 0.5 mg by mouth 3 (three) times daily as needed for anxiety.    Marland Kitchen atorvastatin (LIPITOR) 20 MG tablet Take 20 mg by mouth every evening.    . calcium-vitamin D (OSCAL WITH D) 500-200 MG-UNIT tablet Take 1 tablet by mouth daily with breakfast.    . cetirizine-pseudoephedrine (ZYRTEC-D) 5-120 MG tablet Take 1 tablet by mouth 2 (two) times daily as needed.     . cyanocobalamin 1000 MCG tablet Take 1,000 mcg by mouth daily.    Marland Kitchen letrozole (FEMARA) 2.5 MG tablet Take 1 tablet (2.5 mg total) by mouth daily. 30 tablet 6  . Omega-3 Fatty Acids (OMEGA-3 FISH OIL PO) Take by mouth daily.    . vitamin C (ASCORBIC ACID) 500 MG tablet Take 500 mg by mouth daily.    . Azelastine-Fluticasone 137-50 MCG/ACT SUSP Place 1 spray into the nose in the morning and at bedtime. 23 g 5  . Fluticasone-Umeclidin-Vilant (TRELEGY ELLIPTA) 100-62.5-25 MCG/INH AEPB Inhale 1 puff  into the lungs daily. Rinse mouth after each use. 30 each 5   No current facility-administered medications for this visit.   Allergies: No Known Allergies Social History: Social History   Socioeconomic History  . Marital status: Single    Spouse name: Not on file  . Number of children: Not on file  . Years of education: Not on file  . Highest education level: Not on file  Occupational History  . Not on file  Tobacco Use  . Smoking status: Current Every Day Smoker    Packs/day: 1.00    Years: 35.00    Pack years: 35.00    Types: Cigarettes  . Smokeless tobacco: Never Used  Substance and Sexual Activity  . Alcohol use: Yes    Alcohol/week: 2.0 standard drinks    Types: 2 Cans of beer per week    Comment: occ weekend beers  . Drug use: No  . Sexual activity: Not on file  Other Topics Concern  . Not on file  Social History Narrative  . Not on file   Social Determinants of Health   Financial Resource  Strain:   . Difficulty of Paying Living Expenses:   Food Insecurity:   . Worried About Charity fundraiser in the Last Year:   . Arboriculturist in the Last Year:   Transportation Needs:   . Film/video editor (Medical):   Marland Kitchen Lack of Transportation (Non-Medical):   Physical Activity:   . Days of Exercise per Week:   . Minutes of Exercise per Session:   Stress:   . Feeling of Stress :   Social Connections:   . Frequency of Communication with Friends and Family:   . Frequency of Social Gatherings with Friends and Family:   . Attends Religious Services:   . Active Member of Clubs or Organizations:   . Attends Archivist Meetings:   Marland Kitchen Marital Status:    Lives in a 54 year old home. Smoking: 1-1.5 pack per day x 40 years Occupation: Visual merchandiser  Environmental History: Water Damage/mildew in the house: no Carpet in the family room: no Carpet in the bedroom: no Heating: gas and electric Cooling: central Pet: yes 11 cats and 2 dogs  Family History: Family History  Problem Relation Age of Onset  . Hypertension Mother   . High Cholesterol Mother   . Allergic rhinitis Mother   . Asthma Mother   . Hypertension Father   . Heart disease Father   . COPD Father   . High Cholesterol Father   . Heart disease Paternal Grandfather   . Cancer Sister        ovarian  . Allergic rhinitis Sister   . Cancer Maternal Aunt   . Cancer Paternal Grandmother 88       breast  . Breast cancer Paternal Grandmother   . Angioedema Neg Hx   . Atopy Neg Hx   . Eczema Neg Hx   . Immunodeficiency Neg Hx   . Urticaria Neg Hx    Review of Systems  Constitutional: Negative for appetite change, chills, fever and unexpected weight change.  HENT: Positive for congestion, postnasal drip, rhinorrhea, sneezing and voice change.   Eyes: Positive for itching.  Respiratory: Positive for cough, chest tightness and wheezing. Negative for shortness of breath.   Cardiovascular:  Negative for chest pain.  Gastrointestinal: Negative for abdominal pain.  Genitourinary: Negative for difficulty urinating.  Skin: Negative for rash.  Allergic/Immunologic: Positive for  environmental allergies.  Neurological: Positive for headaches.   Objective: BP 118/78 (BP Location: Right Arm, Patient Position: Sitting, Cuff Size: Normal)   Pulse 88   Temp 98.7 F (37.1 C) (Temporal)   Resp 18   Ht 4' 11.5" (1.511 m)   Wt 133 lb (60.3 kg)   SpO2 99%   BMI 26.41 kg/m  Body mass index is 26.41 kg/m. Physical Exam  Constitutional: She is oriented to person, place, and time. She appears well-developed and well-nourished.  HENT:  Head: Normocephalic and atraumatic.  Right Ear: External ear normal.  Left Ear: External ear normal.  Nose: Nose normal.  Mouth/Throat: Oropharynx is clear and moist.  Eyes: Conjunctivae and EOM are normal.  Cardiovascular: Normal rate, regular rhythm and normal heart sounds. Exam reveals no gallop and no friction rub.  No murmur heard. Pulmonary/Chest: Effort normal and breath sounds normal. She has no wheezes. She has no rales.  Abdominal: Soft.  Musculoskeletal:     Cervical back: Neck supple.  Neurological: She is alert and oriented to person, place, and time.  Skin: Skin is warm. No rash noted.  Psychiatric: She has a normal mood and affect. Her behavior is normal.  Nursing note and vitals reviewed.  The plan was reviewed with the patient/family, and all questions/concerned were addressed.  It was my pleasure to see Denise Reyes today and participate in her care. Please feel free to contact me with any questions or concerns.  Sincerely,  Rexene Alberts, DO Allergy & Immunology  [facility] office: [phone] [facility] office: [place] office: [phone]

## 2019-12-17 NOTE — Assessment & Plan Note (Signed)
Chest tightness, coughing and wheezing for many years.  Currently using albuterol a few times per week with good benefit however she does have daily and nightly symptoms.  Smoking over 1 pack/day for 40+ years.  Today's spirometry was normal with 14% improvement in FEV1 post bronchodilator treatment and clinically feeling improved. . Given her clinical history and spirometry results she most likely has asthma with COPD. . Daily controller medication(s): start Trelegy 1 puff once a day and rinse mouth afterwards.  o Sample given and demonstrated proper use.  . May use albuterol rescue inhaler 2 puffs every 4 to 6 hours as needed for shortness of breath, chest tightness, coughing, and wheezing. May use albuterol rescue inhaler 2 puffs 5 to 15 minutes prior to strenuous physical activities. Monitor frequency of use.  Marland Kitchen Spacer given and demonstrated proper use with inhaler. Patient understood technique and all questions/concerned were addressed.  . Repeat spirometry at next visit. . Consider getting full body PFT as well. . Discussed smoking cessation.

## 2019-12-17 NOTE — Assessment & Plan Note (Signed)
Patient does have some heartburn symptoms however not taking any medications for this.  She also had one episode of pill getting stuck in her throat.  Discussed heartburn lifestyle modifications and if no improvement will start on a daily PPI next.

## 2019-12-17 NOTE — Patient Instructions (Addendum)
Today's skin testing showed: positive to dog and borderline to grass pollen. Negative to common foods.   Environmental allergies  Start environmental control measures as below.  May use over the counter antihistamines such as Zyrtec (cetirizine), Claritin (loratadine), Allegra (fexofenadine), or Xyzal (levocetirizine) daily as needed. May take twice a day if needed. Start dymista (fluticasone + azelastine nasal spray combination) 1 spray per nostril twice a day.  This replaces Flonase (fluticasone) for now. If it's not covered let us know.   Nasal saline spray (i.e., Simply Saline) or nasal saline lavage (i.e., NeilMed) is recommended as needed and prior to medicated nasal sprays.  May use Zaditor eye drops 1-2 times a day as needed for itchy/watery eyes.   Do not use directly over contact lens. Wait 10 minutes after application to put lens back in.   Coughing/breathing: . It's never too late to decrease or even quit smoking!  . Daily controller medication(s): start Trelegy 1 puff once a day and rinse mouth afterwards.  o Sample given and demonstrated proper use.  . May use albuterol rescue inhaler 2 puffs every 4 to 6 hours as needed for shortness of breath, chest tightness, coughing, and wheezing. May use albuterol rescue inhaler 2 puffs 5 to 15 minutes prior to strenuous physical activities. Monitor frequency of use.  Marland Kitchen Spacer given and demonstrated proper use with inhaler. Patient understood technique and all questions/concerned were addressed.  . Breathing control goals:  o Full participation in all desired activities (may need albuterol before activity) o Albuterol use two times or less a week on average (not counting use with activity) o Cough interfering with sleep two times or less a month o Oral steroids no more than once a year o No hospitalizations  Reflux:  See below for lifestyle modifications  Follow up in 2 months or sooner if needed.   Pet Allergen  Avoidance: . Contrary to popular opinion, there are no "hypoallergenic" breeds of dogs or cats. That is because people are not allergic to an animal's hair, but to an allergen found in the animal's saliva, dander (dead skin flakes) or urine. Pet allergy symptoms typically occur within minutes. For some people, symptoms can build up and become most severe 8 to 12 hours after contact with the animal. People with severe allergies can experience reactions in public places if dander has been transported on the pet owners' clothing. Marland Kitchen Keeping an animal outdoors is only a partial solution, since homes with pets in the yard still have higher concentrations of animal allergens. . Before getting a pet, ask your allergist to determine if you are allergic to animals. If your pet is already considered part of your family, try to minimize contact and keep the pet out of the bedroom and other rooms where you spend a great deal of time. . As with dust mites, vacuum carpets often or replace carpet with a hardwood floor, tile or linoleum. . High-efficiency particulate air (HEPA) cleaners can reduce allergen levels over time. . While dander and saliva are the source of cat and dog allergens, urine is the source of allergens from rabbits, hamsters, mice and [place] pigs; so ask a non-allergic family member to clean the animal's cage. . If you have a pet allergy, talk to your allergist about the potential for allergy immunotherapy (allergy shots). This strategy can often provide long-term relief. Reducing Pollen Exposure . Pollen seasons: trees (spring), grass (summer) and ragweed/weeds (fall). Marland Kitchen Keep windows closed in your home and car to  lower pollen exposure.  Susa Simmonds air conditioning in the bedroom and throughout the house if possible.  . Avoid going out in dry windy days - especially early morning. . Pollen counts are highest between 5 - 10 AM and on dry, hot and windy days.  . Save outside activities for late  afternoon or after a heavy rain, when pollen levels are lower.  . Avoid mowing of grass if you have grass pollen allergy. Marland Kitchen Be aware that pollen can also be transported indoors on people and pets.  . Dry your clothes in an automatic dryer rather than hanging them outside where they might collect pollen.  . Rinse hair and eyes before bedtime.    Heartburn Heartburn is a type of pain or discomfort that can happen in the throat or chest. It is often described as a burning pain. It may also cause a bad, acid-like taste in the mouth. Heartburn may feel worse when you lie down or bend over. It may be worse at night. It may be caused by stomach contents that move back up (reflux) into the tube that connects the mouth with the stomach (esophagus). Follow these instructions at home: Eating and drinking   Avoid certain foods and drinks as told by your doctor. This may include: ? Coffee and tea (with or without caffeine). ? Drinks that have alcohol. ? Energy drinks and sports drinks. ? Carbonated drinks or sodas. ? Chocolate and cocoa. ? Peppermint and mint flavorings. ? Garlic and onions. ? Horseradish. ? Spicy and acidic foods, such as:  Peppers.  Chili powder and curry powder.  Vinegar.  Hot sauces and BBQ sauce. ? Citrus fruit juices and citrus fruits, such as:  Oranges.  Lemons.  Limes. ? Tomato-based foods, such as:  Red sauce and pizza with red sauce.  Chili.  Salsa. ? Fried and fatty foods, such as:  Donuts.  [place] fries and potato chips.  High-fat dressings. ? High-fat meats, such as:  Hot dogs and sausage.  Rib eye steak.  Ham and bacon. ? High-fat dairy items, such as:  Whole milk.  Butter.  Cream cheese.  Eat small meals often. Avoid eating large meals.  Avoid drinking large amounts of liquid with your meals.  Avoid eating meals during the 2-3 hours before bedtime.  Avoid lying down right after you eat.  Do not exercise right after you  eat. Lifestyle      If you are overweight, lose an amount of weight that is healthy for you. Ask your doctor about a safe weight loss goal.  Do not use any products that contain nicotine or tobacco, including cigarettes, e-cigarettes, and chewing tobacco. These can make your symptoms worse. If you need help quitting, ask your doctor.  Wear loose clothes. Do not wear anything tight around your waist.  Raise (elevate) the head of your bed about 6 inches (15 cm) when you sleep.  Try to lower your stress. If you need help doing this, ask your doctor. General instructions  Pay attention to any changes in your symptoms.  Take over-the-counter and prescription medicines only as told by your doctor. ? Do not take aspirin, ibuprofen, or other NSAIDs unless your doctor says it is okay. ? Stop medicines only as told by your doctor.  Keep all follow-up visits as told by your doctor. This is important. Contact a doctor if:  You have new symptoms.  You lose weight and you do not know why it is happening.  You have  trouble swallowing, or it hurts to swallow.  You have wheezing or a cough that keeps happening.  Your symptoms do not get better with treatment.  You have heartburn often for more than 2 weeks. Get help right away if:  You have pain in your arms, neck, jaw, teeth, or back.  You feel sweaty, dizzy, or light-headed.  You have chest pain or shortness of breath.  You throw up (vomit) and your throw up looks like blood or coffee grounds.  Your poop (stool) is bloody or black. These symptoms may represent a serious problem that is an emergency. Do not wait to see if the symptoms will go away. Get medical help right away. Call your local emergency services (911 in the [place] Do not drive yourself to the hospital. Summary  Heartburn is a type of pain that can happen in the throat or chest. It can feel like a burning pain. It may also cause a bad, acid-like taste in the  mouth.  You may need to avoid certain foods and drinks to help your symptoms. Ask your doctor what foods and drinks you should avoid.  Take over-the-counter and prescription medicines only as told by your doctor. Do not take aspirin, ibuprofen, or other NSAIDs unless your doctor told you to do so.  Contact your doctor if your symptoms do not get better or they get worse. This information is not intended to replace advice given to you by your health care provider. Make sure you discuss any questions you have with your health care provider. Document Revised: 12/17/2017 Document Reviewed: 12/17/2017 Elsevier Patient Education  [place].

## 2019-12-17 NOTE — Assessment & Plan Note (Signed)
   See assessment and plan as above for allergic rhinitis.  

## 2019-12-17 NOTE — Assessment & Plan Note (Addendum)
Perennial rhinoconjunctivitis symptoms with worsening spring and fall.  Tried Allegra, Zyrtec, Holiday representative with some benefit.  Skin testing a few years ago was negative in the ENT office.  11 cats and 2 dogs at home.  Today's intradermal testing showed: positive to dog and borderline to grass pollen.  Start environmental control measures as below.  May use over the counter antihistamines such as Zyrtec (cetirizine), Claritin (loratadine), Allegra (fexofenadine), or Xyzal (levocetirizine) daily as needed. May take twice a day if needed. Start dymista (fluticasone + azelastine nasal spray combination) 1 spray per nostril twice a day.  This replaces Flonase (fluticasone) for now. If it's not covered let us know.   Nasal saline spray (i.e., Simply Saline) or nasal saline lavage (i.e., NeilMed) is recommended as needed and prior to medicated nasal sprays.  May use Zaditor eye drops 1-2 times a day as needed for itchy/watery eyes.   Do not use directly over contact lens. Wait 10 minutes after application to put lens back in.

## 2019-12-17 NOTE — Telephone Encounter (Signed)
Patient was seen today, 12/17/19, and prescribed Trelegy. When she went to pick it up at the pharmacy, it was $300. She did not get it and would like to know if there is an alternative that does not cost that much.

## 2020-01-27 ENCOUNTER — Ambulatory Visit
Admission: RE | Admit: 2020-01-27 | Discharge: 2020-01-27 | Disposition: A | Discharge status: Home / Self Care | Payer: 59 | Source: Ambulatory Visit | Attending: Oncology | Admitting: Oncology

## 2020-01-27 DIAGNOSIS — D0512 Intraductal carcinoma in situ of left breast: Secondary | ICD-10-CM

## 2020-02-24 DIAGNOSIS — J302 Other seasonal allergic rhinitis: Secondary | ICD-10-CM | POA: Insufficient documentation

## 2020-02-24 DIAGNOSIS — H1010 Acute atopic conjunctivitis, unspecified eye: Secondary | ICD-10-CM | POA: Insufficient documentation

## 2020-02-24 NOTE — Progress Notes (Signed)
Follow Up Note  RE: Denise Reyes MRN: [id number] DOB: 30-Jun-1966 Date of Office Visit: 02/25/2020  Referring provider: Marguerita Merles, MD Primary care provider: Marguerita Merles, MD  Chief Complaint: Asthma (The medicatipoon has helped a lot.)  History of Present Illness: I had the pleasure of seeing Denise Reyes for a follow up visit at the [facility] on 02/25/2020. She is a 54 y.o. female, who is being followed for asthma with COPD, allergic rhinoconjunctivitis and heartburn. Her previous allergy office visit was on 12/17/2019 with Dr. Maudie Mercury. Today is a regular follow up visit.  Asthma with COPD  Currently on Trelegy 1 puff daily and feeling 10/10 better than before.  Patient is able to do more walking even on an incline is coughing less where family and friends are noticing.  Used albuterol the last few weeks due to coughing due to poor air quality with good benefit.  Normally needed to use albuterol about once a week. Still smoking around 1 pack per day.   Allergic rhino conjunctivitis Dymista was causing some PND so stopped using it but not using it the right way.  Taking zyrtec only if needed.  Not needed to use eye drops.  Heartburn Patient taking omeprazole daily with good benefit.   Assessment and Plan: Denise Reyes is a 54 y.o. female with: Asthma with COPD (chronic obstructive pulmonary disease) (HCC) Past history - Chest tightness, coughing and wheezing for many years.  Currently using albuterol a few times per week with good benefit however she does have daily and nightly symptoms.  Smoking over 1 pack/day for 40+ years. 2021 spirometry was normal with 14% improvement in FEV1 post bronchodilator treatment and clinically feeling improved. Interim history - feeling 100% better with Trelegy. Some coughing and using albuterol about once a week with good benefit.  Today's spirometry showed some mild obstruction. Improved from previous one.  ACT score  20.  Encouraged smoking cessation - smoking at least 1 pack per day.  . Ordered full body PFT given smoking history.  . Daily controller medication(s): continue Trelegy 1 puff once a day and rinse mouth afterwards.  . May use albuterol rescue inhaler 2 puffs every 4 to 6 hours as needed for shortness of breath, chest tightness, coughing, and wheezing. May use albuterol rescue inhaler 2 puffs 5 to 15 minutes prior to strenuous physical activities. Monitor frequency of use.   Seasonal and perennial allergic rhinoconjunctivitis Past history - Perennial rhinoconjunctivitis symptoms with worsening spring and fall.  Tried Allegra, Zyrtec, Holiday representative with some benefit.  Skin testing a few years ago was negative in the ENT office.  11 cats and 2 dogs at home. 2021 intradermal testing showed: positive to dog and borderline to grass pollen. Interim history - was not using dymista properly and caused too much PND so stopped using.  Continue environmental control measures as below.  May use over the counter antihistamines such as Zyrtec (cetirizine), Claritin (loratadine), Allegra (fexofenadine), or Xyzal (levocetirizine) daily as needed. May take twice a day if needed. Restart dymista (fluticasone + azelastine nasal spray combination) 1 spray per nostril twice a day. Demonstrated proper use.   Nasal saline spray (i.e., Simply Saline) or nasal saline lavage (i.e., NeilMed) is recommended as needed and prior to medicated nasal sprays.  May use Zaditor eye drops 1-2 times a day as needed for itchy/watery eyes.   Do not use directly over contact lens. Wait 10 minutes after application to put  lens back in.   Heartburn Taking omeprazole daily with good benefit.  See below for heartburn lifestyle modifications.  Continue omeprazole daily.   Return in about 4 months (around 06/27/2020).  Diagnostics: Spirometry:  Tracings reviewed. Her effort: Good reproducible efforts. FVC: 2.88L FEV1:  1.84L, 81% predicted FEV1/FVC ratio: 64% Interpretation: Spirometry consistent with mild obstructive disease. Improved from previous. Please see scanned spirometry results for details.  Medication List:  Current Outpatient Medications  Medication Sig Dispense Refill  . albuterol (PROVENTIL HFA;VENTOLIN HFA) 108 (90 Base) MCG/ACT inhaler Inhale 2 puffs into the lungs 2 (two) times daily as needed for wheezing or shortness of breath.   4  . ALPRAZolam (XANAX) 0.5 MG tablet Take 0.5 mg by mouth 3 (three) times daily as needed for anxiety.    Marland Kitchen atorvastatin (LIPITOR) 20 MG tablet Take 20 mg by mouth every evening.    . Azelastine-Fluticasone 137-50 MCG/ACT SUSP Place 1 spray into the nose in the morning and at bedtime. 23 g 5  . calcium-vitamin D (OSCAL WITH D) 500-200 MG-UNIT tablet Take 1 tablet by mouth daily with breakfast.    . cetirizine-pseudoephedrine (ZYRTEC-D) 5-120 MG tablet Take 1 tablet by mouth 2 (two) times daily as needed.     . cyanocobalamin 1000 MCG tablet Take 1,000 mcg by mouth daily.    . Fluticasone-Umeclidin-Vilant (TRELEGY ELLIPTA) 100-62.5-25 MCG/INH AEPB Inhale 1 puff into the lungs daily. Rinse mouth after each use. 30 each 5  . letrozole (FEMARA) 2.5 MG tablet Take 1 tablet (2.5 mg total) by mouth daily. 30 tablet 6  . Omega-3 Fatty Acids (OMEGA-3 FISH OIL PO) Take by mouth daily.    . vitamin C (ASCORBIC ACID) 500 MG tablet Take 500 mg by mouth daily.     No current facility-administered medications for this visit.   Allergies: No Known Allergies I reviewed her past medical history, social history, family history, and environmental history and no significant changes have been reported from her previous visit.  Review of Systems  Constitutional: Negative for appetite change, chills, fever and unexpected weight change.  HENT: Positive for congestion, postnasal drip and rhinorrhea. Negative for sneezing and voice change.   Eyes: Negative for itching.  Respiratory:  Positive for cough. Negative for chest tightness, shortness of breath and wheezing.   Cardiovascular: Negative for chest pain.  Gastrointestinal: Negative for abdominal pain.  Genitourinary: Negative for difficulty urinating.  Skin: Negative for rash.  Allergic/Immunologic: Positive for environmental allergies.   Objective: BP (!) 102/64   Pulse (!) 109   Temp 98.2 F (36.8 C)   Ht 4' 11.5" (1.511 m)   Wt 139 lb 6.4 oz (63.2 kg)   SpO2 98%   BMI 27.68 kg/m  Body mass index is 27.68 kg/m. Physical Exam Vitals and nursing note reviewed.  Constitutional:      Appearance: Normal appearance. She is well-developed.  HENT:     Head: Normocephalic and atraumatic.     Right Ear: Tympanic membrane and external ear normal.     Left Ear: Tympanic membrane and external ear normal.     Nose: Nose normal.     Mouth/Throat:     Mouth: Mucous membranes are moist.     Pharynx: Oropharynx is clear.  Eyes:     Conjunctiva/sclera: Conjunctivae normal.  Cardiovascular:     Rate and Rhythm: Normal rate and regular rhythm.     Heart sounds: Normal heart sounds. No murmur heard.  No friction rub. No gallop.   Pulmonary:  Effort: Pulmonary effort is normal.     Breath sounds: Normal breath sounds. No wheezing or rales.  Musculoskeletal:     Cervical back: Neck supple.  Skin:    General: Skin is warm.     Findings: No rash.  Neurological:     Mental Status: She is alert and oriented to person, place, and time.  Psychiatric:        Behavior: Behavior normal.    Previous notes and tests were reviewed. The plan was reviewed with the patient/family, and all questions/concerned were addressed.  It was my pleasure to see Denise Reyes today and participate in her care. Please feel free to contact me with any questions or concerns.  Sincerely,  Rexene Alberts, DO Allergy & Immunology  [facility] office: [phone] [facility] office: [place] office: [phone]

## 2020-02-25 ENCOUNTER — Encounter: Payer: Self-pay | Admitting: Allergy

## 2020-02-25 ENCOUNTER — Other Ambulatory Visit: Payer: Self-pay

## 2020-02-25 ENCOUNTER — Ambulatory Visit (INDEPENDENT_AMBULATORY_CARE_PROVIDER_SITE_OTHER): Payer: 59 | Admitting: Allergy

## 2020-02-25 VITALS — BP 102/64 | HR 109 | Temp 98.2°F | Ht 59.5 in | Wt 139.4 lb

## 2020-02-25 DIAGNOSIS — Z720 Tobacco use: Secondary | ICD-10-CM | POA: Diagnosis not present

## 2020-02-25 DIAGNOSIS — J302 Other seasonal allergic rhinitis: Secondary | ICD-10-CM | POA: Diagnosis not present

## 2020-02-25 DIAGNOSIS — R12 Heartburn: Secondary | ICD-10-CM | POA: Diagnosis not present

## 2020-02-25 DIAGNOSIS — H1010 Acute atopic conjunctivitis, unspecified eye: Secondary | ICD-10-CM

## 2020-02-25 DIAGNOSIS — J449 Chronic obstructive pulmonary disease, unspecified: Secondary | ICD-10-CM

## 2020-02-25 DIAGNOSIS — J3089 Other allergic rhinitis: Secondary | ICD-10-CM

## 2020-02-25 NOTE — Assessment & Plan Note (Signed)
Taking omeprazole daily with good benefit.  See below for heartburn lifestyle modifications.  Continue omeprazole daily.

## 2020-02-25 NOTE — Patient Instructions (Addendum)
Environmental allergies Past skin testing showed: positive to dog and borderline to grass pollen.  Continue environmental control measures as below.  May use over the counter antihistamines such as Zyrtec (cetirizine), Claritin (loratadine), Allegra (fexofenadine), or Xyzal (levocetirizine) daily as needed. May take twice a day if needed. Restart dymista (fluticasone + azelastine nasal spray combination) 1 spray per nostril twice a day.  Nasal saline spray (i.e., Simply Saline) or nasal saline lavage (i.e., NeilMed) is recommended as needed and prior to medicated nasal sprays.  May use Zaditor eye drops 1-2 times a day as needed for itchy/watery eyes.   Do not use directly over contact lens. Wait 10 minutes after application to put lens back in.   Coughing/breathing: . It's never too late to decrease or even quit smoking!  o Try to cut down 1 cigarette per month. . Get full body PFT at the hospital - we will call you about this.  . Daily controller medication(s): continue Trelegy 1 puff once a day and rinse mouth afterwards.  . May use albuterol rescue inhaler 2 puffs every 4 to 6 hours as needed for shortness of breath, chest tightness, coughing, and wheezing. May use albuterol rescue inhaler 2 puffs 5 to 15 minutes prior to strenuous physical activities. Monitor frequency of use.  . Breathing control goals:  o Full participation in all desired activities (may need albuterol before activity) o Albuterol use two times or less a week on average (not counting use with activity) o Cough interfering with sleep two times or less a month o Oral steroids no more than once a year o No hospitalizations  Reflux:  See below for lifestyle modifications.  Take omeprazole daily.  Follow up in 4 months or sooner if needed.   Pet Allergen Avoidance: . Contrary to popular opinion, there are no "hypoallergenic" breeds of dogs or cats. That is because people are not allergic to an animal's hair, but  to an allergen found in the animal's saliva, dander (dead skin flakes) or urine. Pet allergy symptoms typically occur within minutes. For some people, symptoms can build up and become most severe 8 to 12 hours after contact with the animal. People with severe allergies can experience reactions in public places if dander has been transported on the pet owners' clothing. Marland Kitchen Keeping an animal outdoors is only a partial solution, since homes with pets in the yard still have higher concentrations of animal allergens. . Before getting a pet, ask your allergist to determine if you are allergic to animals. If your pet is already considered part of your family, try to minimize contact and keep the pet out of the bedroom and other rooms where you spend a great deal of time. . As with dust mites, vacuum carpets often or replace carpet with a hardwood floor, tile or linoleum. . High-efficiency particulate air (HEPA) cleaners can reduce allergen levels over time. . While dander and saliva are the source of cat and dog allergens, urine is the source of allergens from rabbits, hamsters, mice and [place] pigs; so ask a non-allergic family member to clean the animal's cage. . If you have a pet allergy, talk to your allergist about the potential for allergy immunotherapy (allergy shots). This strategy can often provide long-term relief. Reducing Pollen Exposure . Pollen seasons: trees (spring), grass (summer) and ragweed/weeds (fall). Marland Kitchen Keep windows closed in your home and car to lower pollen exposure.  Susa Simmonds air conditioning in the bedroom and throughout the house if possible.  Marland Kitchen  Avoid going out in dry windy days - especially early morning. . Pollen counts are highest between 5 - 10 AM and on dry, hot and windy days.  . Save outside activities for late afternoon or after a heavy rain, when pollen levels are lower.  . Avoid mowing of grass if you have grass pollen allergy. Marland Kitchen Be aware that pollen can also be  transported indoors on people and pets.  . Dry your clothes in an automatic dryer rather than hanging them outside where they might collect pollen.  . Rinse hair and eyes before bedtime.    Heartburn Heartburn is a type of pain or discomfort that can happen in the throat or chest. It is often described as a burning pain. It may also cause a bad, acid-like taste in the mouth. Heartburn may feel worse when you lie down or bend over. It may be worse at night. It may be caused by stomach contents that move back up (reflux) into the tube that connects the mouth with the stomach (esophagus). Follow these instructions at home: Eating and drinking   Avoid certain foods and drinks as told by your doctor. This may include: ? Coffee and tea (with or without caffeine). ? Drinks that have alcohol. ? Energy drinks and sports drinks. ? Carbonated drinks or sodas. ? Chocolate and cocoa. ? Peppermint and mint flavorings. ? Garlic and onions. ? Horseradish. ? Spicy and acidic foods, such as:  Peppers.  Chili powder and curry powder.  Vinegar.  Hot sauces and BBQ sauce. ? Citrus fruit juices and citrus fruits, such as:  Oranges.  Lemons.  Limes. ? Tomato-based foods, such as:  Red sauce and pizza with red sauce.  Chili.  Salsa. ? Fried and fatty foods, such as:  Donuts.  [place] fries and potato chips.  High-fat dressings. ? High-fat meats, such as:  Hot dogs and sausage.  Rib eye steak.  Ham and bacon. ? High-fat dairy items, such as:  Whole milk.  Butter.  Cream cheese.  Eat small meals often. Avoid eating large meals.  Avoid drinking large amounts of liquid with your meals.  Avoid eating meals during the 2-3 hours before bedtime.  Avoid lying down right after you eat.  Do not exercise right after you eat. Lifestyle      If you are overweight, lose an amount of weight that is healthy for you. Ask your doctor about a safe weight loss goal.  Do not use  any products that contain nicotine or tobacco, including cigarettes, e-cigarettes, and chewing tobacco. These can make your symptoms worse. If you need help quitting, ask your doctor.  Wear loose clothes. Do not wear anything tight around your waist.  Raise (elevate) the head of your bed about 6 inches (15 cm) when you sleep.  Try to lower your stress. If you need help doing this, ask your doctor. General instructions  Pay attention to any changes in your symptoms.  Take over-the-counter and prescription medicines only as told by your doctor. ? Do not take aspirin, ibuprofen, or other NSAIDs unless your doctor says it is okay. ? Stop medicines only as told by your doctor.  Keep all follow-up visits as told by your doctor. This is important. Contact a doctor if:  You have new symptoms.  You lose weight and you do not know why it is happening.  You have trouble swallowing, or it hurts to swallow.  You have wheezing or a cough that keeps happening.  Your  symptoms do not get better with treatment.  You have heartburn often for more than 2 weeks. Get help right away if:  You have pain in your arms, neck, jaw, teeth, or back.  You feel sweaty, dizzy, or light-headed.  You have chest pain or shortness of breath.  You throw up (vomit) and your throw up looks like blood or coffee grounds.  Your poop (stool) is bloody or black. These symptoms may represent a serious problem that is an emergency. Do not wait to see if the symptoms will go away. Get medical help right away. Call your local emergency services (911 in the [place] Do not drive yourself to the hospital. Summary  Heartburn is a type of pain that can happen in the throat or chest. It can feel like a burning pain. It may also cause a bad, acid-like taste in the mouth.  You may need to avoid certain foods and drinks to help your symptoms. Ask your doctor what foods and drinks you should avoid.  Take over-the-counter and  prescription medicines only as told by your doctor. Do not take aspirin, ibuprofen, or other NSAIDs unless your doctor told you to do so.  Contact your doctor if your symptoms do not get better or they get worse. This information is not intended to replace advice given to you by your health care provider. Make sure you discuss any questions you have with your health care provider. Document Revised: 12/17/2017 Document Reviewed: 12/17/2017 Elsevier Patient Education  [place].

## 2020-02-25 NOTE — Assessment & Plan Note (Signed)
Past history - Chest tightness, coughing and wheezing for many years.  Currently using albuterol a few times per week with good benefit however she does have daily and nightly symptoms.  Smoking over 1 pack/day for 40+ years. 2021 spirometry was normal with 14% improvement in FEV1 post bronchodilator treatment and clinically feeling improved. Interim history - feeling 100% better with Trelegy. Some coughing and using albuterol about once a week with good benefit.  Today's spirometry showed some mild obstruction. Improved from previous one.  ACT score 20.  Encouraged smoking cessation - smoking at least 1 pack per day.  . Ordered full body PFT given smoking history.  . Daily controller medication(s): continue Trelegy 1 puff once a day and rinse mouth afterwards.  . May use albuterol rescue inhaler 2 puffs every 4 to 6 hours as needed for shortness of breath, chest tightness, coughing, and wheezing. May use albuterol rescue inhaler 2 puffs 5 to 15 minutes prior to strenuous physical activities. Monitor frequency of use.

## 2020-02-25 NOTE — Assessment & Plan Note (Signed)
Past history - Perennial rhinoconjunctivitis symptoms with worsening spring and fall.  Tried Allegra, Zyrtec, Holiday representative with some benefit.  Skin testing a few years ago was negative in the ENT office.  11 cats and 2 dogs at home. 2021 intradermal testing showed: positive to dog and borderline to grass pollen. Interim history - was not using dymista properly and caused too much PND so stopped using.  Continue environmental control measures as below.  May use over the counter antihistamines such as Zyrtec (cetirizine), Claritin (loratadine), Allegra (fexofenadine), or Xyzal (levocetirizine) daily as needed. May take twice a day if needed. Restart dymista (fluticasone + azelastine nasal spray combination) 1 spray per nostril twice a day. Demonstrated proper use.   Nasal saline spray (i.e., Simply Saline) or nasal saline lavage (i.e., NeilMed) is recommended as needed and prior to medicated nasal sprays.  May use Zaditor eye drops 1-2 times a day as needed for itchy/watery eyes.   Do not use directly over contact lens. Wait 10 minutes after application to put lens back in.

## 2020-03-08 ENCOUNTER — Other Ambulatory Visit: Payer: 59

## 2020-03-08 ENCOUNTER — Ambulatory Visit: Payer: 59 | Admitting: Oncology

## 2020-03-08 ENCOUNTER — Ambulatory Visit: Payer: 59

## 2020-03-09 ENCOUNTER — Inpatient Hospital Stay: Payer: 59

## 2020-03-09 ENCOUNTER — Other Ambulatory Visit: Payer: Self-pay

## 2020-03-09 ENCOUNTER — Inpatient Hospital Stay: Payer: 59 | Attending: Oncology

## 2020-03-09 ENCOUNTER — Inpatient Hospital Stay (HOSPITAL_BASED_OUTPATIENT_CLINIC_OR_DEPARTMENT_OTHER): Payer: 59 | Admitting: Oncology

## 2020-03-09 ENCOUNTER — Encounter: Payer: Self-pay | Admitting: Oncology

## 2020-03-09 VITALS — BP 113/79 | HR 92 | Temp 98.0°F | Resp 18 | Wt 143.9 lb

## 2020-03-09 DIAGNOSIS — Z79811 Long term (current) use of aromatase inhibitors: Secondary | ICD-10-CM | POA: Insufficient documentation

## 2020-03-09 DIAGNOSIS — D0512 Intraductal carcinoma in situ of left breast: Secondary | ICD-10-CM | POA: Diagnosis not present

## 2020-03-09 DIAGNOSIS — M8580 Other specified disorders of bone density and structure, unspecified site: Secondary | ICD-10-CM | POA: Insufficient documentation

## 2020-03-09 DIAGNOSIS — Z780 Asymptomatic menopausal state: Secondary | ICD-10-CM

## 2020-03-09 LAB — CBC WITH DIFFERENTIAL/PLATELET
Abs Immature Granulocytes: 0.02 10*3/uL (ref 0.00–0.07)
Basophils Absolute: 0.1 10*3/uL (ref 0.0–0.1)
Basophils Relative: 1 %
Eosinophils Absolute: 0.1 10*3/uL (ref 0.0–0.5)
Eosinophils Relative: 1 %
HCT: 39.4 % (ref 36.0–46.0)
Hemoglobin: 13.6 g/dL (ref 12.0–15.0)
Immature Granulocytes: 0 %
Lymphocytes Relative: 31 %
Lymphs Abs: 2.5 10*3/uL (ref 0.7–4.0)
MCH: 32.3 pg (ref 26.0–34.0)
MCHC: 34.5 g/dL (ref 30.0–36.0)
MCV: 93.6 fL (ref 80.0–100.0)
Monocytes Absolute: 0.8 10*3/uL (ref 0.1–1.0)
Monocytes Relative: 10 %
Neutro Abs: 4.6 10*3/uL (ref 1.7–7.7)
Neutrophils Relative %: 57 %
Platelets: 271 10*3/uL (ref 150–400)
RBC: 4.21 MIL/uL (ref 3.87–5.11)
RDW: 12.6 % (ref 11.5–15.5)
WBC: 8 10*3/uL (ref 4.0–10.5)
nRBC: 0 % (ref 0.0–0.2)

## 2020-03-09 LAB — COMPREHENSIVE METABOLIC PANEL
ALT: 15 U/L (ref 0–44)
AST: 17 U/L (ref 15–41)
Albumin: 3.9 g/dL (ref 3.5–5.0)
Alkaline Phosphatase: 50 U/L (ref 38–126)
Anion gap: 7 (ref 5–15)
BUN: 7 mg/dL (ref 6–20)
CO2: 26 mmol/L (ref 22–32)
Calcium: 8.8 mg/dL — ABNORMAL LOW (ref 8.9–10.3)
Chloride: 104 mmol/L (ref 98–111)
Creatinine, Ser: 0.47 mg/dL (ref 0.44–1.00)
GFR calc Af Amer: >60 mL/min (ref >60)
GFR calc non Af Amer: >60 mL/min (ref >60)
Glucose, Bld: 67 mg/dL — ABNORMAL LOW (ref 70–99)
Potassium: 3.6 mmol/L (ref 3.5–5.1)
Sodium: 137 mmol/L (ref 135–145)
Total Bilirubin: 0.6 mg/dL (ref 0.3–1.2)
Total Protein: 6.7 g/dL (ref 6.5–8.1)

## 2020-03-09 MED ORDER — ZOLEDRONIC ACID 4 MG/100ML IV SOLN
4.0000 mg | Freq: Once | INTRAVENOUS | Status: AC
  Administered 2020-03-09: 4 mg via INTRAVENOUS
  Filled 2020-03-09: qty 100

## 2020-03-09 MED ORDER — SODIUM CHLORIDE 0.9 % IV SOLN
Freq: Once | INTRAVENOUS | Status: AC
  Filled 2020-03-09: qty 250

## 2020-03-09 NOTE — Progress Notes (Signed)
Hematology/Oncology follow up  note [facility] [phone] [phone]   Patient Care Team: Marguerita Merles, MD as PCP - General (Family Medicine)  REFERRING PROVIDER: Dr.Cooper. REASON FOR VISIT Follow up for treatment of :  DCIS  HISTORY OF PRESENTING ILLNESS:  Denise Reyes is a  54 y.o.  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   Patient had a mammogram done in May 2019.  Mammogram showed indeterminate calcifications in the upper outer quadrant of the left breast for which biopsy is indicated.  Patient is status post stereotactic guided core biopsy of the left breast calcifications. 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   Patient underwent left breast lumpectomy on 01/23/2018, pathology showed fibrocystic change with microcalcifications Patient was referred to oncology for further evaluation and management.   # Patient's case was discussed at breast tumor board on 02/11/2018. patient that although her DCIS foci is very small, her breast has very proliferative background, consistent with hormone replacement treatment.  Status post adjuvant RT completed in September 2019. Letrozole was started in September 2019   # Postmenopausal status: obtained patient's GYN operation report from Dr. Louann Liv office. Patient is in menopasue state as she had had bilateral salpingo-oophorectomy and hysterectomy on 12/30/2013. Pathology reviewed.  Showed benign essentially unremarkable cervix, benign proliferative endometrium associated with benign endometrial polyp.  Adenomyosis, leiomyomata, benign right ovary with small foci of endometriosis, benign right paratubal cyst essentially unremarkable left ovary and the fallopian tube.  Marland Kitchen #We obtained her records  from GYN office and reviewed. DEXA done in May 2019 showed left femoral neck T score -1.1, AP spine -0.3, right femoral neck -1.1  #September 2019, started on letrozole  INTERVAL HISTORY Denise Reyes is a 54 y.o. female who has above history reviewed by me today presents for management of DCIS.  Patient reports feeling well today.  She has been on letrozole.  Manageable hot flash and joint pain.   Review of Systems  Constitutional: Negative for chills, fever, malaise/fatigue and weight loss.  HENT: Negative for nosebleeds and sore throat.   Eyes: Negative for double vision, photophobia and redness.  Respiratory: Negative for cough, shortness of breath and wheezing.   Cardiovascular: Negative for chest pain, palpitations, orthopnea and leg swelling.  Gastrointestinal: Negative for abdominal pain, blood in stool, nausea and vomiting.  Genitourinary: Negative for dysuria.  Musculoskeletal: Positive for joint pain. Negative for back pain, myalgias and neck pain.  Skin: Negative for itching and rash.  Neurological: Negative for dizziness, tingling and tremors.  Endo/Heme/Allergies: Negative for environmental allergies. Does not bruise/bleed easily.       Hot flash  Psychiatric/Behavioral: Negative for depression and hallucinations.    MEDICAL HISTORY:  Past Medical History:  Diagnosis Date   Allergy    PT CURRENTLY TAKING ABX AND PREDNISONE GIVEN BY DR Tami Ribas ON 01-16-18 FOR INFECTION THAT SHE GETS THIS TIME EVERY YEAR   Anxiety    Arthritis    knees, wrists   Asthma    allergy induced    Breast cancer ([place])    Cancer ([place])    GERD (gastroesophageal reflux disease)    occ-no meds   History of kidney stones    h/o   Personal history of radiation therapy 2019   LEFT lumpectomy   Problems with swallowing and  mastication    Psoriasis    Recurrent upper respiratory infection (URI)    Special screening for malignant neoplasms, colon    Wears contact lenses      SURGICAL HISTORY: Past Surgical History:  Procedure Laterality Date   BILATERAL SALPINGOOPHORECTOMY Bilateral 12/30/2013   BREAST BIOPSY Left    DUCTAL CARCINOMA IN SITU (DCIS), LOW NUCLEAR GRADE.   BREAST BIOPSY Left 01/23/2018   Procedure: BREAST BIOPSY/PARTIAL MASTECTOMY WITH NEEDLE LOCALIZATION;  Surgeon: Florene Glen, MD;  Location: [facility];  Service: General;  Laterality: Left;   BREAST EXCISIONAL BIOPSY Left 01/23/2018   lumectomy with NL    BREAST LUMPECTOMY Left 12/2017   DUCTAL CARCINOMA IN SITU   COLONOSCOPY WITH PROPOFOL N/A 07/07/2016   Procedure: COLONOSCOPY WITH PROPOFOL;  Surgeon: Lucilla Lame, MD;  Location: [place];  Service: Endoscopy;  Laterality: N/A;   ESOPHAGOGASTRODUODENOSCOPY (EGD) WITH PROPOFOL N/A 07/07/2016   Procedure: ESOPHAGOGASTRODUODENOSCOPY (EGD) WITH PROPOFOL;  Surgeon: Lucilla Lame, MD;  Location: [place];  Service: Endoscopy;  Laterality: N/A;   LAPAROSCOPIC ASSISTED VAGINAL HYSTERECTOMY  12/30/2013   URETHRA SURGERY     14 months old    SOCIAL HISTORY: Social History   Socioeconomic History   Marital status: Single    Spouse name: Not on file   Number of children: Not on file   Years of education: Not on file   Highest education level: Not on file  Occupational History   Not on file  Tobacco Use   Smoking status: Current Every Day Smoker    Packs/day: 1.00    Years: 35.00    Pack years: 35.00    Types: Cigarettes   Smokeless tobacco: Never Used  Vaping Use   Vaping Use: Never used  Substance and Sexual Activity   Alcohol use: Yes    Alcohol/week: 2.0 standard drinks    Types: 2 Cans of beer per week    Comment: occ weekend beers   Drug use: No   Sexual activity: Not on file  Other Topics Concern   Not on file  Social History Narrative   Not on file   Social Determinants of Health   Financial Resource Strain:    Difficulty of Paying Living Expenses:   Food Insecurity:     Worried About Charity fundraiser in the Last Year:    Arboriculturist in the Last Year:   Transportation Needs:    Film/video editor (Medical):    Lack of Transportation (Non-Medical):   Physical Activity:    Days of Exercise per Week:    Minutes of Exercise per Session:   Stress:    Feeling of Stress :   Social Connections:    Frequency of Communication with Friends and Family:    Frequency of Social Gatherings with Friends and Family:    Attends Religious Services:    Active Member of Clubs or Organizations:    Attends Music therapist:    Marital Status:   Intimate Partner Violence:    Fear of Current or Ex-Partner:    Emotionally Abused:    Physically Abused:    Sexually Abused:     FAMILY HISTORY: Family History  Problem Relation Age of Onset   Hypertension Mother    High Cholesterol Mother    Allergic rhinitis Mother    Asthma Mother    Hypertension Father    Heart disease Father    COPD Father    High Cholesterol Father  Heart disease Paternal Grandfather    Cancer Sister        ovarian   Allergic rhinitis Sister    Cancer Maternal Aunt    Cancer Paternal Grandmother 59       breast   Breast cancer Paternal Grandmother    Angioedema Neg Hx    Atopy Neg Hx    Eczema Neg Hx    Immunodeficiency Neg Hx    Urticaria Neg Hx     ALLERGIES:  has No Known Allergies.  MEDICATIONS:  Current Outpatient Medications  Medication Sig Dispense Refill   albuterol (PROVENTIL HFA;VENTOLIN HFA) 108 (90 Base) MCG/ACT inhaler Inhale 2 puffs into the lungs 2 (two) times daily as needed for wheezing or shortness of breath.   4   ALPRAZolam (XANAX) 0.5 MG tablet Take 0.5 mg by mouth 3 (three) times daily as needed for anxiety.     atorvastatin (LIPITOR) 20 MG tablet Take 20 mg by mouth every evening.     Azelastine-Fluticasone 137-50 MCG/ACT SUSP Place 1 spray into the nose in the morning and at bedtime. 23 g 5    calcium-vitamin D (OSCAL WITH D) 500-200 MG-UNIT tablet Take 1 tablet by mouth daily with breakfast.     cetirizine-pseudoephedrine (ZYRTEC-D) 5-120 MG tablet Take 1 tablet by mouth 2 (two) times daily as needed.      cyanocobalamin 1000 MCG tablet Take 1,000 mcg by mouth daily.     Fluticasone-Umeclidin-Vilant (TRELEGY ELLIPTA) 100-62.5-25 MCG/INH AEPB Inhale 1 puff into the lungs daily. Rinse mouth after each use. 30 each 5   letrozole (FEMARA) 2.5 MG tablet Take 1 tablet (2.5 mg total) by mouth daily. 30 tablet 6   Omega-3 Fatty Acids (OMEGA-3 FISH OIL PO) Take by mouth daily.     vitamin C (ASCORBIC ACID) 500 MG tablet Take 500 mg by mouth daily.     No current facility-administered medications for this visit.     PHYSICAL EXAMINATION: ECOG PERFORMANCE STATUS: 0 - Asymptomatic Vitals:   03/09/20 1332  BP: 113/79  Pulse: 92  Resp: 18  Temp: 98 F (36.7 C)   Filed Weights   03/09/20 1332  Weight: 143 lb 14.4 oz (65.3 kg)    Physical Exam Constitutional:      General: She is not in acute distress. HENT:     Head: Normocephalic and atraumatic.  Eyes:     General: No scleral icterus.    Conjunctiva/sclera: Conjunctivae normal.     Pupils: Pupils are equal, round, and reactive to light.  Cardiovascular:     Rate and Rhythm: Normal rate and regular rhythm.     Heart sounds: Normal heart sounds.  Pulmonary:     Effort: Pulmonary effort is normal. No respiratory distress.     Breath sounds: Normal breath sounds. No wheezing or rales.  Chest:     Chest wall: No tenderness.  Abdominal:     General: Bowel sounds are normal. There is no distension.     Palpations: Abdomen is soft. There is no mass.     Tenderness: There is no abdominal tenderness.  Musculoskeletal:        General: No deformity. Normal range of motion.     Cervical back: Normal range of motion and neck supple.  Lymphadenopathy:     Cervical: No cervical adenopathy.  Skin:    General: Skin is warm and  dry.     Findings: No erythema or rash.  Neurological:     Mental Status: She is  alert and oriented to person, place, and time. Mental status is at baseline.     Cranial Nerves: No cranial nerve deficit.     Coordination: Coordination normal.  Psychiatric:        Mood and Affect: Mood normal.        Behavior: Behavior normal.        Thought Content: Thought content normal.   Patient declined breast examination.    LABORATORY DATA:  I have reviewed the data as listed Lab Results  Component Value Date   WBC 8.0 03/09/2020   HGB 13.6 03/09/2020   HCT 39.4 03/09/2020   MCV 93.6 03/09/2020   PLT 271 03/09/2020   Recent Labs    06/03/19 0938 09/02/19 1305 03/09/20 1308  NA 138 138 137  K 4.3 3.6 3.6  CL 105 104 104  CO2 25 27 26   GLUCOSE 96 80 67*  BUN 12 8 7   CREATININE 0.66 0.60 0.47  CALCIUM 9.1 9.1 8.8*  GFRNONAA >60 >60 >60  GFRAA >60 >60 >60  PROT 6.8 6.9 6.7  ALBUMIN 4.0 4.1 3.9  AST 21 20 17   ALT 21 19 15   ALKPHOS 45 50 50  BILITOT 0.7 0.5 0.6   Iron/TIBC/Ferritin/ %Sat No results found for: IRON, TIBC, FERRITIN, IRONPCTSAT   RADIOGRAPHIC STUDIES: I have personally reviewed the radiological images as listed and agreed with the findings in the report. 12/11/2017  1. Benign RIGHT breast calcifications. 2. Indeterminate calcifications in the UPPER OUTER QUADRANT of the LEFT breast for which biopsy is indicated. 3. No sonographic or mammographic abnormality in the 4 o'clock location area of clinical concern. 07/25/2018  Diagnostic mammogram showed no mammographic evidence of breast malignancy.  Recommend bilateral diagnostic mammogram in 6 months to resume annual mammogram scheduled. 01/23/2019 bilateral diagnostic mammogram showed no mammographic evidence of malignancy.  ASSESSMENT & PLAN:  1. Ductal carcinoma in situ (DCIS) of left breast   2. Aromatase inhibitor use   3. Osteopenia after menopause    # DCIS s/p lumpectomy and adjuvant RT.  Clinically  patient is doing well.   Continue letrozole, plan for total 5 years. Bilateral diagnostic mammogram images were reviewed by me and discussed with patient.  No radiographic evidence of disease recurrence or new disease.  #Osteopenia in the context of chronic aromatase inhibitor use. Has been on Zometa every 6 months.  Proceed with Zometa treatment. Continue vitamin D and calcium treatments. Patient gets DEXA through gynecologist office.   Orders Placed This Encounter  Procedures   CBC with Differential/Platelet    Standing Status:   Future    Standing Expiration Date:   03/09/2021   Comprehensive metabolic panel    Standing Status:   Future    Standing Expiration Date:   03/09/2021    All questions were answered. The patient knows to call the clinic with any problems questions or concerns.  Return of visit: 6 months.  Earlie Server, MD, PhD Hematology Oncology [facility] Pager- [phone] 03/09/2020

## 2020-03-09 NOTE — Progress Notes (Signed)
Calcium 8.8, Per Benjamine Mola RN Per Dr. Tasia Catchings okay to proceed with Zometa treatment.

## 2020-03-09 NOTE — Progress Notes (Signed)
Patient denies new problems/concerns today.   °

## 2020-03-29 ENCOUNTER — Other Ambulatory Visit: Payer: Self-pay | Admitting: *Deleted

## 2020-03-29 MED ORDER — LETROZOLE 2.5 MG PO TABS: 2.5000 mg | ORAL_TABLET | Freq: Every day | ORAL | 6 refills | Status: DC

## 2020-03-31 ENCOUNTER — Encounter: Payer: Self-pay | Admitting: Allergy

## 2020-04-21 ENCOUNTER — Ambulatory Visit
Admission: RE | Admit: 2020-04-21 | Discharge: 2020-04-21 | Disposition: A | Discharge status: Home / Self Care | Payer: 59 | Source: Ambulatory Visit | Attending: Radiation Oncology | Admitting: Radiation Oncology

## 2020-04-21 ENCOUNTER — Encounter: Payer: Self-pay | Admitting: Radiation Oncology

## 2020-04-21 ENCOUNTER — Other Ambulatory Visit: Payer: Self-pay

## 2020-04-21 VITALS — BP 121/65 | HR 104 | Temp 97.5°F | Wt 139.0 lb

## 2020-04-21 DIAGNOSIS — Z923 Personal history of irradiation: Secondary | ICD-10-CM | POA: Diagnosis not present

## 2020-04-21 DIAGNOSIS — D0512 Intraductal carcinoma in situ of left breast: Secondary | ICD-10-CM | POA: Insufficient documentation

## 2020-04-21 DIAGNOSIS — Z170 Estrogen receptor positive status [ER+]: Secondary | ICD-10-CM | POA: Insufficient documentation

## 2020-04-21 DIAGNOSIS — Z79811 Long term (current) use of aromatase inhibitors: Secondary | ICD-10-CM | POA: Insufficient documentation

## 2020-04-21 NOTE — Progress Notes (Signed)
Radiation Oncology Follow up Note  Name: Denise Reyes   Date:   04/21/2020 MRN:  [id number] DOB: [birth year]    This 54 y.o. female presents to the clinic today for 2-year follow-up status post whole breast radiation to her left breast for ER/PR positive ductal carcinoma in situ.  REFERRING PROVIDER: [name], MD  HPI: Patient is a 54 year old female now at 2 years having completed whole breast radiation to her left breast for ER/PR positive ductal carcinoma in situ.  Seen today in routine follow-up she is doing well.  She specifically denies breast tenderness cough or bone pain..  She had mammograms back in June which I have reviewed were BI-RADS 2 benign.  She is currently on Femara tolerating it well without side effect.  COMPLICATIONS OF TREATMENT: none  FOLLOW UP COMPLIANCE: keeps appointments   PHYSICAL EXAM:  BP 121/65 (BP Location: Right Arm)   Pulse (!) 104   Temp (!) 97.5 F (36.4 C) (Tympanic)   Wt 139 lb (63 kg)   BMI 27.60 kg/m  Lungs are clear to A&P cardiac examination essentially unremarkable with regular rate and rhythm. No dominant mass or nodularity is noted in either breast in 2 positions examined. Incision is well-healed. No axillary or supraclavicular adenopathy is appreciated. Cosmetic result is excellent.  Well-developed well-nourished patient in NAD. HEENT reveals PERLA, EOMI, discs not visualized.  Oral cavity is clear. No oral mucosal lesions are identified. Neck is clear without evidence of cervical or supraclavicular adenopathy. Lungs are clear to A&P. Cardiac examination is essentially unremarkable with regular rate and rhythm without murmur rub or thrill. Abdomen is benign with no organomegaly or masses noted. Motor sensory and DTR levels are equal and symmetric in the upper and lower extremities. Cranial nerves II through XII are grossly intact. Proprioception is intact. No peripheral adenopathy or edema is identified. No motor or sensory levels are  noted. Crude visual fields are within normal range.  RADIOLOGY RESULTS: Mammograms reviewed compatible with above-stated findings  PLAN: Present time patient is doing well now out 2 years with no evidence of disease.  And pleased with her overall progress.  Of asked to see her back in 1 year for follow-up.  Patient knows to call with any concerns at any time.  I would like to take this opportunity to thank you for allowing me to participate in the care of your patient.Noreene Filbert, MD

## 2020-04-22 ENCOUNTER — Ambulatory Visit (HOSPITAL_COMMUNITY)
Admission: RE | Admit: 2020-04-22 | Discharge: 2020-04-22 | Disposition: A | Discharge status: Home / Self Care | Payer: 59 | Source: Ambulatory Visit | Attending: Allergy | Admitting: Allergy

## 2020-04-22 DIAGNOSIS — J449 Chronic obstructive pulmonary disease, unspecified: Secondary | ICD-10-CM | POA: Diagnosis present

## 2020-04-22 LAB — PULMONARY FUNCTION TEST
DL/VA % pred: 76 %
DL/VA: 3.4 ml/min/mmHg/L
DLCO unc % pred: 85 %
DLCO unc: 15.16 ml/min/mmHg
FEF 25-75 Post: 1.72 L/sec
FEF 25-75 Pre: 1.19 L/sec
FEF2575-%Change-Post: 44 %
FEF2575-%Pred-Post: 71 %
FEF2575-%Pred-Pre: 49 %
FEV1-%Change-Post: 8 %
FEV1-%Pred-Post: 82 %
FEV1-%Pred-Pre: 75 %
FEV1-Post: 1.91 L
FEV1-Pre: 1.76 L
FEV1FVC-%Change-Post: 0 %
FEV1FVC-%Pred-Pre: 89 %
FEV6-%Change-Post: 9 %
FEV6-%Pred-Post: 93 %
FEV6-%Pred-Pre: 85 %
FEV6-Post: 2.68 L
FEV6-Pre: 2.45 L
FEV6FVC-%Change-Post: 0 %
FEV6FVC-%Pred-Post: 102 %
FEV6FVC-%Pred-Pre: 102 %
FVC-%Change-Post: 9 %
FVC-%Pred-Post: 91 %
FVC-%Pred-Pre: 83 %
FVC-Post: 2.69 L
FVC-Pre: 2.45 L
Post FEV1/FVC ratio: 71 %
Post FEV6/FVC ratio: 100 %
Pre FEV1/FVC ratio: 72 %
Pre FEV6/FVC Ratio: 100 %
RV % pred: 177 %
RV: 2.87 L
TLC % pred: 122 %
TLC: 5.35 L

## 2020-04-22 MED ORDER — ALBUTEROL SULFATE (2.5 MG/3ML) 0.083% IN NEBU
2.5000 mg | INHALATION_SOLUTION | Freq: Once | RESPIRATORY_TRACT | Status: AC
  Administered 2020-04-22: 2.5 mg via RESPIRATORY_TRACT

## 2020-06-23 ENCOUNTER — Ambulatory Visit: Payer: 59 | Admitting: Allergy

## 2020-06-29 NOTE — Progress Notes (Signed)
Follow Up Note  RE: Denise Reyes MRN: [id number] DOB: 12-Mar-1966 Date of Office Visit: 06/30/2020  Referring provider: Marguerita Merles, MD Primary care provider: Marguerita Merles, MD  Chief Complaint: Follow-up  History of Present Illness: I had the pleasure of seeing Denise Reyes for a follow up visit at the [facility] on 06/30/2020. She is a 54 y.o. female, who is being followed for asthma with COPD, allergic rhinoconjunctivitis, heartburn. Her previous allergy office visit was on 02/25/2020 with Dr. Maudie Mercury. Today is a regular follow up visit.  Asthma with COPD (chronic obstructive pulmonary disease) Currently on Trelegy 171mcg 1 puff in the morning with good benefit.  Coughing has improved since started on Trelegy. Sometimes has shortness of breath when laying down and using albuterol 1-2 times a week with good benefit.   Denies any ER/urgent care visits or prednisone use since the last visit.  Still smoking at least 1 pack per day. Patient's work has been very busy and working many hours lately.  Thinking of getting the COVID-19 vaccine but can't afford to have any symptoms right now due to work.   Seasonal and perennial allergic rhinoconjunctivitis Currently on zyrtec-D daily and sometimes she can take the plain without the decongestant. Using dymista as needed only as it causes drainage. Using Zaditor if needed.  Heartburn Taking omeprazole daily if needed with good benefit.  02/25/2020 full body PFT report: "Conclusions: Minimal airway obstruction and overinflation are present. Pulmonary Function Diagnosis: Minimal Obstructive Airways Disease Airtrapping Insignificant reponse to bronchodilator OverinflationNormal Diffusion"  Assessment and Plan: [name] is a 54 y.o. female with: Asthma with COPD (chronic obstructive pulmonary disease) (HCC) Past history - Chest tightness, coughing and wheezing for many years.  Smoking over 1 pack/day for 40+ years. 2021  spirometry was normal with 14% improvement in FEV1 post bronchodilator treatment and clinically feeling improved. Full body PFT 2021 - minimal airway obstruction and overinflation. Normal diffusion.  Interim history - feeling much better with Trelegy.Using albuterol 1-2 times per week with good benefit.  Encouraged smoking cessation.  . Daily controller medication(s): continue Trelegy 166mcg 1 puff once a day and rinse mouth afterwards.  . May use albuterol rescue inhaler 2 puffs every 4 to 6 hours as needed for shortness of breath, chest tightness, coughing, and wheezing. May use albuterol rescue inhaler 2 puffs 5 to 15 minutes prior to strenuous physical activities. Monitor frequency of use.  . Get spirometry at next visit.  Marland Kitchen Recommend annual flu vaccine and COVID-19 vaccinations.   Seasonal and perennial allergic rhinoconjunctivitis Past history - Perennial rhinoconjunctivitis symptoms with worsening spring and fall.  Tried Allegra, Zyrtec, Holiday representative with some benefit.  Skin testing a few years ago was negative in the ENT office.  11 cats and 2 dogs at home. 2021 intradermal testing showed: positive to dog and borderline to grass pollen. Interim history - still has some nasal symptoms.   Continue environmental control measures as below.  May use over the counter antihistamines such as Zyrtec (cetirizine), Claritin (loratadine), Allegra (fexofenadine), or Xyzal (levocetirizine) daily as needed. May take twice a day if needed.  Try to take the zyrtec without the D. Restart dymista (fluticasone + azelastine nasal spray combination) 1 spray per nostril twice a day. Demonstrated proper use.   Nasal saline spray (i.e., Simply Saline) or nasal saline lavage (i.e., NeilMed) is recommended as needed and prior to medicated nasal sprays.  May use Zaditor eye drops 1-2 times  a day as needed for itchy/watery eyes.   Do not use directly over contact lens. Wait 10 minutes after application to  put lens back in.   Heartburn Taking omeprazole daily as needed with good benefit.  See below for heartburn lifestyle modifications.  Continue omeprazole daily as needed.   Return in about 4 months (around 10/29/2020).  Meds ordered this encounter  Medications  . albuterol (VENTOLIN HFA) 108 (90 Base) MCG/ACT inhaler    Sig: Inhale 2 puffs into the lungs every 4 (four) hours as needed for wheezing or shortness of breath (coughing, chest tightness).    Dispense:  18 g    Refill:  2   Diagnostics: None.  Medication List:  Current Outpatient Medications  Medication Sig Dispense Refill  . albuterol (VENTOLIN HFA) 108 (90 Base) MCG/ACT inhaler Inhale 2 puffs into the lungs every 4 (four) hours as needed for wheezing or shortness of breath (coughing, chest tightness). 18 g 2  . atorvastatin (LIPITOR) 20 MG tablet Take 20 mg by mouth every evening.    . Azelastine-Fluticasone 137-50 MCG/ACT SUSP Place 1 spray into the nose in the morning and at bedtime. 23 g 5  . calcium-vitamin D (OSCAL WITH D) 500-200 MG-UNIT tablet Take 1 tablet by mouth daily with breakfast.    . cetirizine-pseudoephedrine (ZYRTEC-D) 5-120 MG tablet Take 1 tablet by mouth 2 (two) times daily as needed.     . cyanocobalamin 1000 MCG tablet Take 1,000 mcg by mouth daily.    . Fluticasone-Umeclidin-Vilant (TRELEGY ELLIPTA) 100-62.5-25 MCG/INH AEPB Inhale 1 puff into the lungs daily. Rinse mouth after each use. 30 each 5  . letrozole (FEMARA) 2.5 MG tablet Take 1 tablet (2.5 mg total) by mouth daily. 30 tablet 6  . Omega-3 Fatty Acids (OMEGA-3 FISH OIL PO) Take by mouth daily.    . vitamin C (ASCORBIC ACID) 500 MG tablet Take 500 mg by mouth daily.    Marland Kitchen ALPRAZolam (XANAX) 0.5 MG tablet Take 0.5 mg by mouth 3 (three) times daily as needed for anxiety. (Patient not taking: Reported on 06/30/2020)     No current facility-administered medications for this visit.   Allergies: No Known Allergies I reviewed her past medical  history, social history, family history, and environmental history and no significant changes have been reported from her previous visit.  Review of Systems  Constitutional: Negative for appetite change, chills, fever and unexpected weight change.  HENT: Positive for congestion, postnasal drip and rhinorrhea. Negative for sneezing and voice change.   Eyes: Negative for itching.  Respiratory: Negative for cough, chest tightness, shortness of breath and wheezing.   Cardiovascular: Negative for chest pain.  Gastrointestinal: Negative for abdominal pain.  Genitourinary: Negative for difficulty urinating.  Skin: Negative for rash.  Allergic/Immunologic: Positive for environmental allergies.   Objective: BP 108/64 (BP Location: Right Arm, Patient Position: Sitting, Cuff Size: Normal)   Pulse 94   Temp 97.8 F (36.6 C) (Temporal)   Resp 16   Ht 4' 11.5" (1.511 m)   Wt 145 lb 12.8 oz (66.1 kg)   SpO2 98%   BMI 28.96 kg/m  Body mass index is 28.96 kg/m. Physical Exam Vitals and nursing note reviewed.  Constitutional:      Appearance: Normal appearance. She is well-developed.  HENT:     Head: Normocephalic and atraumatic.     Right Ear: Tympanic membrane and external ear normal.     Left Ear: Tympanic membrane and external ear normal.     Nose:  Nose normal.     Mouth/Throat:     Mouth: Mucous membranes are moist.     Pharynx: Oropharynx is clear.  Eyes:     Conjunctiva/sclera: Conjunctivae normal.  Cardiovascular:     Rate and Rhythm: Normal rate and regular rhythm.     Heart sounds: Normal heart sounds. No murmur heard.  No friction rub. No gallop.   Pulmonary:     Effort: Pulmonary effort is normal.     Breath sounds: Normal breath sounds. No wheezing or rales.  Musculoskeletal:     Cervical back: Neck supple.  Skin:    General: Skin is warm.     Findings: No rash.  Neurological:     Mental Status: She is alert and oriented to person, place, and time.  Psychiatric:         Behavior: Behavior normal.    Previous notes and tests were reviewed. The plan was reviewed with the patient/family, and all questions/concerned were addressed.  It was my pleasure to see Darly today and participate in her care. Please feel free to contact me with any questions or concerns.  Sincerely,  Rexene Alberts, DO Allergy & Immunology  [facility] office: [place] office: [phone]

## 2020-06-30 ENCOUNTER — Other Ambulatory Visit: Payer: Self-pay

## 2020-06-30 ENCOUNTER — Encounter: Payer: Self-pay | Admitting: Allergy

## 2020-06-30 ENCOUNTER — Ambulatory Visit (INDEPENDENT_AMBULATORY_CARE_PROVIDER_SITE_OTHER): Payer: 59 | Admitting: Allergy

## 2020-06-30 VITALS — BP 108/64 | HR 94 | Temp 97.8°F | Resp 16 | Ht 59.5 in | Wt 145.8 lb

## 2020-06-30 DIAGNOSIS — H1010 Acute atopic conjunctivitis, unspecified eye: Secondary | ICD-10-CM

## 2020-06-30 DIAGNOSIS — J302 Other seasonal allergic rhinitis: Secondary | ICD-10-CM | POA: Diagnosis not present

## 2020-06-30 DIAGNOSIS — J3089 Other allergic rhinitis: Secondary | ICD-10-CM

## 2020-06-30 DIAGNOSIS — R12 Heartburn: Secondary | ICD-10-CM

## 2020-06-30 DIAGNOSIS — J449 Chronic obstructive pulmonary disease, unspecified: Secondary | ICD-10-CM

## 2020-06-30 MED ORDER — ALBUTEROL SULFATE HFA 108 (90 BASE) MCG/ACT IN AERS: 2.0000 | INHALATION_SPRAY | RESPIRATORY_TRACT | 2 refills | Status: DC | PRN

## 2020-06-30 NOTE — Assessment & Plan Note (Signed)
Past history - Perennial rhinoconjunctivitis symptoms with worsening spring and fall.  Tried Allegra, Zyrtec, Holiday representative with some benefit.  Skin testing a few years ago was negative in the ENT office.  11 cats and 2 dogs at home. 2021 intradermal testing showed: positive to dog and borderline to grass pollen. Interim history - still has some nasal symptoms.   Continue environmental control measures as below.  May use over the counter antihistamines such as Zyrtec (cetirizine), Claritin (loratadine), Allegra (fexofenadine), or Xyzal (levocetirizine) daily as needed. May take twice a day if needed.  Try to take the zyrtec without the D. Restart dymista (fluticasone + azelastine nasal spray combination) 1 spray per nostril twice a day. Demonstrated proper use.   Nasal saline spray (i.e., Simply Saline) or nasal saline lavage (i.e., NeilMed) is recommended as needed and prior to medicated nasal sprays.  May use Zaditor eye drops 1-2 times a day as needed for itchy/watery eyes.   Do not use directly over contact lens. Wait 10 minutes after application to put lens back in.

## 2020-06-30 NOTE — Assessment & Plan Note (Signed)
Past history - Chest tightness, coughing and wheezing for many years.  Smoking over 1 pack/day for 40+ years. 2021 spirometry was normal with 14% improvement in FEV1 post bronchodilator treatment and clinically feeling improved. Full body PFT 2021 - minimal airway obstruction and overinflation. Normal diffusion.  Interim history - feeling much better with Trelegy.Using albuterol 1-2 times per week with good benefit.  Encouraged smoking cessation.  . Daily controller medication(s): continue Trelegy 126mcg 1 puff once a day and rinse mouth afterwards.  . May use albuterol rescue inhaler 2 puffs every 4 to 6 hours as needed for shortness of breath, chest tightness, coughing, and wheezing. May use albuterol rescue inhaler 2 puffs 5 to 15 minutes prior to strenuous physical activities. Monitor frequency of use.  . Get spirometry at next visit.  Marland Kitchen Recommend annual flu vaccine and COVID-19 vaccinations.

## 2020-06-30 NOTE — Patient Instructions (Addendum)
Environmental allergies Past skin testing showed: positive to dog and borderline to grass pollen.  Continue environmental control measures as below.  May use over the counter antihistamines such as Zyrtec (cetirizine), Claritin (loratadine), Allegra (fexofenadine), or Xyzal (levocetirizine) daily as needed. May take twice a day if needed.  Try to take the zyrtec plain without the D. Restart dymista (fluticasone + azelastine nasal spray combination) 1 spray per nostril twice a day.  Nasal saline spray (i.e., Simply Saline) or nasal saline lavage (i.e., NeilMed) is recommended as needed and prior to medicated nasal sprays.  May use Zaditor eye drops 1-2 times a day as needed for itchy/watery eyes.   Do not use directly over contact lens. Wait 10 minutes after application to put lens back in.   Asthma/copd:  Marland Kitchen It's never too late to decrease or even quit smoking!  o Try to cut down 1 cigarette per month. . Daily controller medication(s): continue Trelegy 1 puff once a day and rinse mouth afterwards.  . May use albuterol rescue inhaler 2 puffs every 4 to 6 hours as needed for shortness of breath, chest tightness, coughing, and wheezing. May use albuterol rescue inhaler 2 puffs 5 to 15 minutes prior to strenuous physical activities. Monitor frequency of use.  . Breathing control goals:  o Full participation in all desired activities (may need albuterol before activity) o Albuterol use two times or less a week on average (not counting use with activity) o Cough interfering with sleep two times or less a month o Oral steroids no more than once a year o No hospitalizations  Reflux:  See below for lifestyle modifications.  Take omeprazole daily as needed.  Recommend getting the COVID-19 vaccine.  Here's more information: [employer]  Follow up in 4 months or sooner if needed.   Pet Allergen Avoidance: . Contrary to popular opinion, there are no "hypoallergenic"  breeds of dogs or cats. That is because people are not allergic to an animal's hair, but to an allergen found in the animal's saliva, dander (dead skin flakes) or urine. Pet allergy symptoms typically occur within minutes. For some people, symptoms can build up and become most severe 8 to 12 hours after contact with the animal. People with severe allergies can experience reactions in public places if dander has been transported on the pet owners' clothing. Marland Kitchen Keeping an animal outdoors is only a partial solution, since homes with pets in the yard still have higher concentrations of animal allergens. . Before getting a pet, ask your allergist to determine if you are allergic to animals. If your pet is already considered part of your family, try to minimize contact and keep the pet out of the bedroom and other rooms where you spend a great deal of time. . As with dust mites, vacuum carpets often or replace carpet with a hardwood floor, tile or linoleum. . High-efficiency particulate air (HEPA) cleaners can reduce allergen levels over time. . While dander and saliva are the source of cat and dog allergens, urine is the source of allergens from rabbits, hamsters, mice and [place] pigs; so ask a non-allergic family member to clean the animal's cage. . If you have a pet allergy, talk to your allergist about the potential for allergy immunotherapy (allergy shots). This strategy can often provide long-term relief. Reducing Pollen Exposure . Pollen seasons: trees (spring), grass (summer) and ragweed/weeds (fall). Marland Kitchen Keep windows closed in your home and car to lower pollen exposure.  Susa Simmonds air conditioning in the  bedroom and throughout the house if possible.  . Avoid going out in dry windy days - especially early morning. . Pollen counts are highest between 5 - 10 AM and on dry, hot and windy days.  . Save outside activities for late afternoon or after a heavy rain, when pollen levels are lower.  . Avoid mowing  of grass if you have grass pollen allergy. Marland Kitchen Be aware that pollen can also be transported indoors on people and pets.  . Dry your clothes in an automatic dryer rather than hanging them outside where they might collect pollen.  . Rinse hair and eyes before bedtime.    Heartburn Heartburn is a type of pain or discomfort that can happen in the throat or chest. It is often described as a burning pain. It may also cause a bad, acid-like taste in the mouth. Heartburn may feel worse when you lie down or bend over. It may be worse at night. It may be caused by stomach contents that move back up (reflux) into the tube that connects the mouth with the stomach (esophagus). Follow these instructions at home: Eating and drinking   Avoid certain foods and drinks as told by your doctor. This may include: ? Coffee and tea (with or without caffeine). ? Drinks that have alcohol. ? Energy drinks and sports drinks. ? Carbonated drinks or sodas. ? Chocolate and cocoa. ? Peppermint and mint flavorings. ? Garlic and onions. ? Horseradish. ? Spicy and acidic foods, such as:  Peppers.  Chili powder and curry powder.  Vinegar.  Hot sauces and BBQ sauce. ? Citrus fruit juices and citrus fruits, such as:  Oranges.  Lemons.  Limes. ? Tomato-based foods, such as:  Red sauce and pizza with red sauce.  Chili.  Salsa. ? Fried and fatty foods, such as:  Donuts.  [place] fries and potato chips.  High-fat dressings. ? High-fat meats, such as:  Hot dogs and sausage.  Rib eye steak.  Ham and bacon. ? High-fat dairy items, such as:  Whole milk.  Butter.  Cream cheese.  Eat small meals often. Avoid eating large meals.  Avoid drinking large amounts of liquid with your meals.  Avoid eating meals during the 2-3 hours before bedtime.  Avoid lying down right after you eat.  Do not exercise right after you eat. Lifestyle      If you are overweight, lose an amount of weight that is  healthy for you. Ask your doctor about a safe weight loss goal.  Do not use any products that contain nicotine or tobacco, including cigarettes, e-cigarettes, and chewing tobacco. These can make your symptoms worse. If you need help quitting, ask your doctor.  Wear loose clothes. Do not wear anything tight around your waist.  Raise (elevate) the head of your bed about 6 inches (15 cm) when you sleep.  Try to lower your stress. If you need help doing this, ask your doctor. General instructions  Pay attention to any changes in your symptoms.  Take over-the-counter and prescription medicines only as told by your doctor. ? Do not take aspirin, ibuprofen, or other NSAIDs unless your doctor says it is okay. ? Stop medicines only as told by your doctor.  Keep all follow-up visits as told by your doctor. This is important. Contact a doctor if:  You have new symptoms.  You lose weight and you do not know why it is happening.  You have trouble swallowing, or it hurts to swallow.  You have  wheezing or a cough that keeps happening.  Your symptoms do not get better with treatment.  You have heartburn often for more than 2 weeks. Get help right away if:  You have pain in your arms, neck, jaw, teeth, or back.  You feel sweaty, dizzy, or light-headed.  You have chest pain or shortness of breath.  You throw up (vomit) and your throw up looks like blood or coffee grounds.  Your poop (stool) is bloody or black. These symptoms may represent a serious problem that is an emergency. Do not wait to see if the symptoms will go away. Get medical help right away. Call your local emergency services (911 in the [place] Do not drive yourself to the hospital. Summary  Heartburn is a type of pain that can happen in the throat or chest. It can feel like a burning pain. It may also cause a bad, acid-like taste in the mouth.  You may need to avoid certain foods and drinks to help your symptoms. Ask your  doctor what foods and drinks you should avoid.  Take over-the-counter and prescription medicines only as told by your doctor. Do not take aspirin, ibuprofen, or other NSAIDs unless your doctor told you to do so.  Contact your doctor if your symptoms do not get better or they get worse. This information is not intended to replace advice given to you by your health care provider. Make sure you discuss any questions you have with your health care provider. Document Revised: 12/17/2017 Document Reviewed: 12/17/2017 Elsevier Patient Education  [place].

## 2020-06-30 NOTE — Assessment & Plan Note (Signed)
Taking omeprazole daily as needed with good benefit.  See below for heartburn lifestyle modifications.  Continue omeprazole daily as needed.

## 2020-08-02 ENCOUNTER — Telehealth: Payer: Self-pay | Admitting: Allergy

## 2020-08-02 NOTE — Telephone Encounter (Signed)
Patient went to fill her Trelogy and it was $300. She said she got a coupon for 2021 that brought the cost down for this medication. She is asking if we have a coupon or discount card for 2022.

## 2020-08-02 NOTE — Telephone Encounter (Signed)
Called and spoke to patient to inform her that we did not have any coupon cards but I did refer her to the website where she can put in her information to enroll in the savings program. Patient agreed and verbally expressed understanding.

## 2020-09-10 ENCOUNTER — Inpatient Hospital Stay: Payer: 59

## 2020-09-10 ENCOUNTER — Encounter: Payer: Self-pay | Admitting: Oncology

## 2020-09-10 ENCOUNTER — Other Ambulatory Visit: Payer: Self-pay

## 2020-09-10 ENCOUNTER — Inpatient Hospital Stay (HOSPITAL_BASED_OUTPATIENT_CLINIC_OR_DEPARTMENT_OTHER): Payer: 59 | Admitting: Oncology

## 2020-09-10 ENCOUNTER — Inpatient Hospital Stay: Payer: 59 | Attending: Oncology

## 2020-09-10 VITALS — BP 112/75 | HR 99 | Temp 97.9°F | Resp 18 | Wt 148.5 lb

## 2020-09-10 DIAGNOSIS — J449 Chronic obstructive pulmonary disease, unspecified: Secondary | ICD-10-CM | POA: Insufficient documentation

## 2020-09-10 DIAGNOSIS — Z79811 Long term (current) use of aromatase inhibitors: Secondary | ICD-10-CM | POA: Diagnosis not present

## 2020-09-10 DIAGNOSIS — Z79899 Other long term (current) drug therapy: Secondary | ICD-10-CM | POA: Diagnosis not present

## 2020-09-10 DIAGNOSIS — D0512 Intraductal carcinoma in situ of left breast: Secondary | ICD-10-CM | POA: Insufficient documentation

## 2020-09-10 DIAGNOSIS — M8580 Other specified disorders of bone density and structure, unspecified site: Secondary | ICD-10-CM | POA: Diagnosis not present

## 2020-09-10 LAB — COMPREHENSIVE METABOLIC PANEL
ALT: 22 U/L (ref 0–44)
AST: 23 U/L (ref 15–41)
Albumin: 4.1 g/dL (ref 3.5–5.0)
Alkaline Phosphatase: 53 U/L (ref 38–126)
Anion gap: 9 (ref 5–15)
BUN: 15 mg/dL (ref 6–20)
CO2: 26 mmol/L (ref 22–32)
Calcium: 9.4 mg/dL (ref 8.9–10.3)
Chloride: 103 mmol/L (ref 98–111)
Creatinine, Ser: 0.68 mg/dL (ref 0.44–1.00)
GFR, Estimated: >60 mL/min (ref >60)
Glucose, Bld: 105 mg/dL — ABNORMAL HIGH (ref 70–99)
Potassium: 4.2 mmol/L (ref 3.5–5.1)
Sodium: 138 mmol/L (ref 135–145)
Total Bilirubin: 0.8 mg/dL (ref 0.3–1.2)
Total Protein: 7.2 g/dL (ref 6.5–8.1)

## 2020-09-10 LAB — CBC WITH DIFFERENTIAL/PLATELET
Abs Immature Granulocytes: 0.01 10*3/uL (ref 0.00–0.07)
Basophils Absolute: 0.1 10*3/uL (ref 0.0–0.1)
Basophils Relative: 1 %
Eosinophils Absolute: 0.1 10*3/uL (ref 0.0–0.5)
Eosinophils Relative: 1 %
HCT: 41.7 % (ref 36.0–46.0)
Hemoglobin: 14.6 g/dL (ref 12.0–15.0)
Immature Granulocytes: 0 %
Lymphocytes Relative: 31 %
Lymphs Abs: 2 10*3/uL (ref 0.7–4.0)
MCH: 32.6 pg (ref 26.0–34.0)
MCHC: 35 g/dL (ref 30.0–36.0)
MCV: 93.1 fL (ref 80.0–100.0)
Monocytes Absolute: 0.4 10*3/uL (ref 0.1–1.0)
Monocytes Relative: 6 %
Neutro Abs: 3.9 10*3/uL (ref 1.7–7.7)
Neutrophils Relative %: 61 %
Platelets: 279 10*3/uL (ref 150–400)
RBC: 4.48 MIL/uL (ref 3.87–5.11)
RDW: 12.3 % (ref 11.5–15.5)
WBC: 6.5 10*3/uL (ref 4.0–10.5)
nRBC: 0 % (ref 0.0–0.2)

## 2020-09-10 MED ORDER — ZOLEDRONIC ACID 4 MG/100ML IV SOLN
4.0000 mg | Freq: Once | INTRAVENOUS | Status: AC
  Administered 2020-09-10: 4 mg via INTRAVENOUS
  Filled 2020-09-10: qty 100

## 2020-09-10 MED ORDER — SODIUM CHLORIDE 0.9 % IV SOLN
INTRAVENOUS | Status: DC
  Filled 2020-09-10: qty 250

## 2020-09-10 NOTE — Progress Notes (Signed)
Hematology/Oncology follow up  note [facility] Telephone:([phone]) [phone] Fax:(336) [phone]   Patient Care Team: Marguerita Merles, MD as PCP - General (Family Medicine)  REFERRING PROVIDER: Dr.Cooper. REASON FOR VISIT Follow up for treatment of :  DCIS  HISTORY OF PRESENTING ILLNESS:  Denise Reyes is a  55 y.o.  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   Patient had a mammogram done in May 2019.  Mammogram showed indeterminate calcifications in the upper outer quadrant of the left breast for which biopsy is indicated.  Patient is status post stereotactic guided core biopsy of the left breast calcifications. 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   Patient underwent left breast lumpectomy on 01/23/2018, pathology showed fibrocystic change with microcalcifications Patient was referred to oncology for further evaluation and management.   # Patient's case was discussed at breast tumor board on 02/11/2018. patient that although her DCIS foci is very small, her breast has very proliferative background, consistent with hormone replacement treatment.  Status post adjuvant RT completed in September 2019. Letrozole was started in September 2019   # Postmenopausal status: obtained patient's GYN operation report from Dr. Louann Liv office. Patient is in menopasue state as she had had bilateral salpingo-oophorectomy and hysterectomy on 12/30/2013. Pathology reviewed.  Showed benign essentially unremarkable cervix, benign proliferative endometrium associated with benign endometrial polyp.  Adenomyosis, leiomyomata, benign right ovary with small foci of endometriosis, benign right paratubal cyst essentially unremarkable left ovary and the fallopian tube.  Marland Kitchen #We obtained her records  from GYN office and reviewed. DEXA done in May 2019 showed left femoral neck T score -1.1, AP spine -0.3, right femoral neck -1.1  #September 2019, started on letrozole  INTERVAL HISTORY Denise Reyes is a 55 y.o. female who has above history reviewed by me today presents for management of DCIS. Patient reports feeling well today.  She takes letrozole and tolerates well. Recently she reached her left arm backwards and suddenly felt pulled sensation across her left anterior chest and breast.     Review of Systems  Constitutional: Negative for chills, fever, malaise/fatigue and weight loss.  HENT: Negative for nosebleeds and sore throat.   Eyes: Negative for double vision, photophobia and redness.  Respiratory: Negative for cough, shortness of breath and wheezing.   Cardiovascular: Negative for chest pain, palpitations, orthopnea and leg swelling.  Gastrointestinal: Negative for abdominal pain, blood in stool, nausea and vomiting.  Genitourinary: Negative for dysuria.  Musculoskeletal: Positive for joint pain. Negative for back pain, myalgias and neck pain.  Skin: Negative for itching and rash.  Neurological: Negative for dizziness, tingling and tremors.  Endo/Heme/Allergies: Negative for environmental allergies. Does not bruise/bleed easily.       Hot flash  Psychiatric/Behavioral: Negative for depression and hallucinations.    MEDICAL HISTORY:  Past Medical History:  Diagnosis Date  . Allergy    PT CURRENTLY TAKING ABX AND PREDNISONE GIVEN BY DR Tami Ribas ON 01-16-18 FOR INFECTION THAT SHE GETS THIS TIME EVERY YEAR  . Anxiety   . Arthritis    knees, wrists  . Asthma    allergy induced   . Breast cancer ([place])   . Cancer ([place])   . GERD (gastroesophageal reflux disease)    occ-no meds  . History of kidney stones    h/o  . Personal history  of radiation therapy 2019   LEFT lumpectomy  . Problems with swallowing and mastication   . Psoriasis   . Recurrent upper respiratory  infection (URI)   . Special screening for malignant neoplasms, colon   . Wears contact lenses     SURGICAL HISTORY: Past Surgical History:  Procedure Laterality Date  . BILATERAL SALPINGOOPHORECTOMY Bilateral 12/30/2013  . BREAST BIOPSY Left    DUCTAL CARCINOMA IN SITU (DCIS), LOW NUCLEAR GRADE.  Marland Kitchen BREAST BIOPSY Left 01/23/2018   Procedure: BREAST BIOPSY/PARTIAL MASTECTOMY WITH NEEDLE LOCALIZATION;  Surgeon: Florene Glen, MD;  Location: [facility];  Service: General;  Laterality: Left;  . BREAST EXCISIONAL BIOPSY Left 01/23/2018   lumectomy with NL   . BREAST LUMPECTOMY Left 12/2017   DUCTAL CARCINOMA IN SITU  . COLONOSCOPY WITH PROPOFOL N/A 07/07/2016   Procedure: COLONOSCOPY WITH PROPOFOL;  Surgeon: Lucilla Lame, MD;  Location: [facility];  Service: Endoscopy;  Laterality: N/A;  . ESOPHAGOGASTRODUODENOSCOPY (EGD) WITH PROPOFOL N/A 07/07/2016   Procedure: ESOPHAGOGASTRODUODENOSCOPY (EGD) WITH PROPOFOL;  Surgeon: Lucilla Lame, MD;  Location: [place] [facility];  Service: Endoscopy;  Laterality: N/A;  . LAPAROSCOPIC ASSISTED VAGINAL HYSTERECTOMY  12/30/2013  . URETHRA SURGERY     18 months old    SOCIAL HISTORY: Social History   Socioeconomic History  . Marital status: Single    Spouse name: Not on file  . Number of children: Not on file  . Years of education: Not on file  . Highest education level: Not on file  Occupational History  . Not on file  Tobacco Use  . Smoking status: Current Every Day Smoker    Packs/day: 1.00    Years: 35.00    Pack years: 35.00    Types: Cigarettes  . Smokeless tobacco: Never Used  Vaping Use  . Vaping Use: Never used  Substance and Sexual Activity  . Alcohol use: Yes    Alcohol/week: 2.0 standard drinks    Types: 2 Cans of beer per week    Comment: occ weekend beers  . Drug use: No  . Sexual activity: Not on file  Other Topics Concern  . Not on file  Social History Narrative  . Not on file   Social Determinants of Health    Financial Resource Strain: Not on file  Food Insecurity: Not on file  Transportation Needs: Not on file  Physical Activity: Not on file  Stress: Not on file  Social Connections: Not on file  Intimate Partner Violence: Not on file    FAMILY HISTORY: Family History  Problem Relation Age of Onset  . Hypertension Mother   . High Cholesterol Mother   . Allergic rhinitis Mother   . Asthma Mother   . Hypertension Father   . Heart disease Father   . COPD Father   . High Cholesterol Father   . Heart disease Paternal Grandfather   . Cancer Sister        ovarian  . Allergic rhinitis Sister   . Cancer Maternal Aunt   . Cancer Paternal Grandmother 56       breast  . Breast cancer Paternal Grandmother   . Angioedema Neg Hx   . Atopy Neg Hx   . Eczema Neg Hx   . Immunodeficiency Neg Hx   . Urticaria Neg Hx     ALLERGIES:  has No Known Allergies.  MEDICATIONS:  Current Outpatient Medications  Medication Sig Dispense Refill  . albuterol (VENTOLIN HFA) 108 (90 Base) MCG/ACT inhaler Inhale  2 puffs into the lungs every 4 (four) hours as needed for wheezing or shortness of breath (coughing, chest tightness). 18 g 2  . ALPRAZolam (XANAX) 0.5 MG tablet Take 0.5 mg by mouth 3 (three) times daily as needed for anxiety. (Patient not taking: Reported on 06/30/2020)    . atorvastatin (LIPITOR) 20 MG tablet Take 20 mg by mouth every evening.    . Azelastine-Fluticasone 137-50 MCG/ACT SUSP Place 1 spray into the nose in the morning and at bedtime. 23 g 5  . calcium-vitamin D (OSCAL WITH D) 500-200 MG-UNIT tablet Take 1 tablet by mouth daily with breakfast.    . cetirizine-pseudoephedrine (ZYRTEC-D) 5-120 MG tablet Take 1 tablet by mouth 2 (two) times daily as needed.     . cyanocobalamin 1000 MCG tablet Take 1,000 mcg by mouth daily.    . Fluticasone-Umeclidin-Vilant (TRELEGY ELLIPTA) 100-62.5-25 MCG/INH AEPB Inhale 1 puff into the lungs daily. Rinse mouth after each use. 30 each 5  . letrozole  (FEMARA) 2.5 MG tablet Take 1 tablet (2.5 mg total) by mouth daily. 30 tablet 6  . Omega-3 Fatty Acids (OMEGA-3 FISH OIL PO) Take by mouth daily.    . vitamin C (ASCORBIC ACID) 500 MG tablet Take 500 mg by mouth daily.     No current facility-administered medications for this visit.     PHYSICAL EXAMINATION: ECOG PERFORMANCE STATUS: 0 - Asymptomatic Vitals:   09/10/20 1316  BP: 112/75  Pulse: 99  Resp: 18  Temp: 97.9 F (36.6 C)   Filed Weights   09/10/20 1316  Weight: 148 lb 8 oz (67.4 kg)    Physical Exam Constitutional:      General: She is not in acute distress. HENT:     Head: Normocephalic and atraumatic.  Eyes:     General: No scleral icterus.    Conjunctiva/sclera: Conjunctivae normal.     Pupils: Pupils are equal, round, and reactive to light.  Cardiovascular:     Rate and Rhythm: Normal rate and regular rhythm.     Heart sounds: Normal heart sounds.  Pulmonary:     Effort: Pulmonary effort is normal. No respiratory distress.     Breath sounds: Normal breath sounds. No wheezing or rales.  Chest:     Chest wall: No tenderness.  Abdominal:     General: Bowel sounds are normal. There is no distension.     Palpations: Abdomen is soft. There is no mass.     Tenderness: There is no abdominal tenderness.  Musculoskeletal:        General: No deformity. Normal range of motion.     Cervical back: Normal range of motion and neck supple.  Lymphadenopathy:     Cervical: No cervical adenopathy.  Skin:    General: Skin is warm and dry.     Findings: No erythema or rash.  Neurological:     Mental Status: She is alert and oriented to person, place, and time. Mental status is at baseline.     Cranial Nerves: No cranial nerve deficit.     Coordination: Coordination normal.  Psychiatric:        Mood and Affect: Mood normal.        Behavior: Behavior normal.        Thought Content: Thought content normal.   Breast exam was performed in seated and lying down  position. Patient is status post left lumpectomy with a well-healed surgical scar. No evidence of any palpable masses. No palpable of axillary adenopathy. No palpable  masses or lumps in the right breast.     LABORATORY DATA:  I have reviewed the data as listed Lab Results  Component Value Date   WBC 8.0 03/09/2020   HGB 13.6 03/09/2020   HCT 39.4 03/09/2020   MCV 93.6 03/09/2020   PLT 271 03/09/2020   Recent Labs    03/09/20 1308  NA 137  K 3.6  CL 104  CO2 26  GLUCOSE 67*  BUN 7  CREATININE 0.47  CALCIUM 8.8*  GFRNONAA >60  GFRAA >60  PROT 6.7  ALBUMIN 3.9  AST 17  ALT 15  ALKPHOS 50  BILITOT 0.6   Iron/TIBC/Ferritin/ %Sat No results found for: IRON, TIBC, FERRITIN, IRONPCTSAT   RADIOGRAPHIC STUDIES: I have personally reviewed the radiological images as listed and agreed with the findings in the report. 12/11/2017  1. Benign RIGHT breast calcifications. 2. Indeterminate calcifications in the UPPER OUTER QUADRANT of the LEFT breast for which biopsy is indicated. 3. No sonographic or mammographic abnormality in the 4 o'clock location area of clinical concern. 07/25/2018  Diagnostic mammogram showed no mammographic evidence of breast malignancy.  Recommend bilateral diagnostic mammogram in 6 months to resume annual mammogram scheduled. 01/23/2019 bilateral diagnostic mammogram showed no mammographic evidence of malignancy.  ASSESSMENT & PLAN:  1. Ductal carcinoma in situ (DCIS) of left breast   2. Aromatase inhibitor use    # DCIS s/p lumpectomy and adjuvant RT.  Clinically patient is doing well.   Labs are reviewed and discussed with patient. Continue Letrozole, plan for total 5 years.  Bilateral mammogram images due in June/July 2022. Will obtain.   #Osteopenia in the context of chronic aromatase inhibitor use. Has been on Zometa every 6 months.  Proceed with Zometa today Continue vitamin D and calcium treatments. Patient gets DEXA through gynecologist  office, most recent DEXA was done in 2021.   Orders Placed This Encounter  Procedures  . MM 3D SCREEN BREAST BILATERAL    Standing Status:   Future    Standing Expiration Date:   09/10/2021    Order Specific Question:   Reason for Exam (SYMPTOM  OR DIAGNOSIS REQUIRED)    Answer:   history of DCIS    Order Specific Question:   Is the patient pregnant?    Answer:   No    Order Specific Question:   Preferred imaging location?    Answer:   [facility]    All questions were answered. The patient knows to call the clinic with any problems questions or concerns.  Return of visit: 6 months.  Earlie Server, MD, PhD Hematology Oncology [facility] Pager- [phone] 09/10/2020

## 2020-09-10 NOTE — Progress Notes (Signed)
Denise Reyes tolerated her Zometa infusion well today.

## 2020-09-10 NOTE — Progress Notes (Signed)
Pt here for follow up. Pt reports pulling sensation to left breast.

## 2020-09-30 ENCOUNTER — Other Ambulatory Visit: Payer: Self-pay

## 2020-09-30 ENCOUNTER — Encounter: Payer: Self-pay | Admitting: Oncology

## 2020-09-30 MED ORDER — LETROZOLE 2.5 MG PO TABS: 2.5000 mg | ORAL_TABLET | Freq: Every day | ORAL | 6 refills | Status: DC

## 2020-11-10 ENCOUNTER — Encounter: Payer: Self-pay | Admitting: Allergy

## 2020-11-10 ENCOUNTER — Other Ambulatory Visit: Payer: Self-pay

## 2020-11-10 ENCOUNTER — Ambulatory Visit (INDEPENDENT_AMBULATORY_CARE_PROVIDER_SITE_OTHER): Payer: 59 | Admitting: Allergy

## 2020-11-10 VITALS — BP 112/76 | HR 100 | Temp 97.7°F | Resp 16

## 2020-11-10 DIAGNOSIS — J302 Other seasonal allergic rhinitis: Secondary | ICD-10-CM | POA: Diagnosis not present

## 2020-11-10 DIAGNOSIS — J449 Chronic obstructive pulmonary disease, unspecified: Secondary | ICD-10-CM | POA: Diagnosis not present

## 2020-11-10 DIAGNOSIS — H1013 Acute atopic conjunctivitis, bilateral: Secondary | ICD-10-CM

## 2020-11-10 DIAGNOSIS — H1010 Acute atopic conjunctivitis, unspecified eye: Secondary | ICD-10-CM

## 2020-11-10 DIAGNOSIS — R12 Heartburn: Secondary | ICD-10-CM | POA: Diagnosis not present

## 2020-11-10 DIAGNOSIS — J3089 Other allergic rhinitis: Secondary | ICD-10-CM

## 2020-11-10 MED ORDER — TRELEGY ELLIPTA 200-62.5-25 MCG/INH IN AEPB: 1.0000 | INHALATION_SPRAY | Freq: Every day | RESPIRATORY_TRACT | 5 refills | Status: DC

## 2020-11-10 NOTE — Patient Instructions (Addendum)
Environmental allergies Past skin testing showed: positive to dog and borderline to grass pollen.  Continue environmental control measures as below.  May use over the counter antihistamines such as Zyrtec (cetirizine), Claritin (loratadine), Allegra (fexofenadine), or Xyzal (levocetirizine) daily as needed. May take twice a day if needed.  Try to take the zyrtec plain without the D. TAKE dymista (fluticasone + azelastine nasal spray combination) 1 spray per nostril twice a day.  Nasal saline spray (i.e., Simply Saline) or nasal saline lavage (i.e., NeilMed) is recommended as needed and prior to medicated nasal sprays.  May use Zaditor eye drops 1-2 times a day as needed for itchy/watery eyes.   Do not use directly over contact lens. Wait 10 minutes after application to put lens back in.   Asthma/copd:  Marland Kitchen It's never too late to decrease or even quit smoking! Smoke outside if possible.  o Try to cut down 1 cigarette per month. . Daily controller medication(s):  Marland Kitchen INCREASE Trelegy to 253mcg 1 puff daily from May through June. Sample given.  . Then go back to 116mcg 1 puff daily.  o Make sure you rinse your mouth after each use.   . May use albuterol rescue inhaler 2 puffs every 4 to 6 hours as needed for shortness of breath, chest tightness, coughing, and wheezing. May use albuterol rescue inhaler 2 puffs 5 to 15 minutes prior to strenuous physical activities. Monitor frequency of use.  . Breathing control goals:  o Full participation in all desired activities (may need albuterol before activity) o Albuterol use two times or less a week on average (not counting use with activity) o Cough interfering with sleep two times or less a month o Oral steroids no more than once a year o No hospitalizations  Reflux:  See below for lifestyle modifications.  Take omeprazole daily as needed.  Follow up in 3 months or sooner if needed.   Pet Allergen Avoidance: . Contrary to popular opinion,  there are no "hypoallergenic" breeds of dogs or cats. That is because people are not allergic to an animal's hair, but to an allergen found in the animal's saliva, dander (dead skin flakes) or urine. Pet allergy symptoms typically occur within minutes. For some people, symptoms can build up and become most severe 8 to 12 hours after contact with the animal. People with severe allergies can experience reactions in public places if dander has been transported on the pet owners' clothing. Marland Kitchen Keeping an animal outdoors is only a partial solution, since homes with pets in the yard still have higher concentrations of animal allergens. . Before getting a pet, ask your allergist to determine if you are allergic to animals. If your pet is already considered part of your family, try to minimize contact and keep the pet out of the bedroom and other rooms where you spend a great deal of time. . As with dust mites, vacuum carpets often or replace carpet with a hardwood floor, tile or linoleum. . High-efficiency particulate air (HEPA) cleaners can reduce allergen levels over time. . While dander and saliva are the source of cat and dog allergens, urine is the source of allergens from rabbits, hamsters, mice and [place] pigs; so ask a non-allergic family member to clean the animal's cage. . If you have a pet allergy, talk to your allergist about the potential for allergy immunotherapy (allergy shots). This strategy can often provide long-term relief. Reducing Pollen Exposure . Pollen seasons: trees (spring), grass (summer) and ragweed/weeds (fall). Marland Kitchen  Keep windows closed in your home and car to lower pollen exposure.  Susa Simmonds air conditioning in the bedroom and throughout the house if possible.  . Avoid going out in dry windy days - especially early morning. . Pollen counts are highest between 5 - 10 AM and on dry, hot and windy days.  . Save outside activities for late afternoon or after a heavy rain, when pollen  levels are lower.  . Avoid mowing of grass if you have grass pollen allergy. Marland Kitchen Be aware that pollen can also be transported indoors on people and pets.  . Dry your clothes in an automatic dryer rather than hanging them outside where they might collect pollen.  . Rinse hair and eyes before bedtime.    Heartburn Heartburn is a type of pain or discomfort that can happen in the throat or chest. It is often described as a burning pain. It may also cause a bad, acid-like taste in the mouth. Heartburn may feel worse when you lie down or bend over. It may be worse at night. It may be caused by stomach contents that move back up (reflux) into the tube that connects the mouth with the stomach (esophagus). Follow these instructions at home: Eating and drinking   Avoid certain foods and drinks as told by your doctor. This may include: ? Coffee and tea (with or without caffeine). ? Drinks that have alcohol. ? Energy drinks and sports drinks. ? Carbonated drinks or sodas. ? Chocolate and cocoa. ? Peppermint and mint flavorings. ? Garlic and onions. ? Horseradish. ? Spicy and acidic foods, such as:  Peppers.  Chili powder and curry powder.  Vinegar.  Hot sauces and BBQ sauce. ? Citrus fruit juices and citrus fruits, such as:  Oranges.  Lemons.  Limes. ? Tomato-based foods, such as:  Red sauce and pizza with red sauce.  Chili.  Salsa. ? Fried and fatty foods, such as:  Donuts.  [place] fries and potato chips.  High-fat dressings. ? High-fat meats, such as:  Hot dogs and sausage.  Rib eye steak.  Ham and bacon. ? High-fat dairy items, such as:  Whole milk.  Butter.  Cream cheese.  Eat small meals often. Avoid eating large meals.  Avoid drinking large amounts of liquid with your meals.  Avoid eating meals during the 2-3 hours before bedtime.  Avoid lying down right after you eat.  Do not exercise right after you eat. Lifestyle      If you are overweight,  lose an amount of weight that is healthy for you. Ask your doctor about a safe weight loss goal.  Do not use any products that contain nicotine or tobacco, including cigarettes, e-cigarettes, and chewing tobacco. These can make your symptoms worse. If you need help quitting, ask your doctor.  Wear loose clothes. Do not wear anything tight around your waist.  Raise (elevate) the head of your bed about 6 inches (15 cm) when you sleep.  Try to lower your stress. If you need help doing this, ask your doctor. General instructions  Pay attention to any changes in your symptoms.  Take over-the-counter and prescription medicines only as told by your doctor. ? Do not take aspirin, ibuprofen, or other NSAIDs unless your doctor says it is okay. ? Stop medicines only as told by your doctor.  Keep all follow-up visits as told by your doctor. This is important. Contact a doctor if:  You have new symptoms.  You lose weight and you do  not know why it is happening.  You have trouble swallowing, or it hurts to swallow.  You have wheezing or a cough that keeps happening.  Your symptoms do not get better with treatment.  You have heartburn often for more than 2 weeks. Get help right away if:  You have pain in your arms, neck, jaw, teeth, or back.  You feel sweaty, dizzy, or light-headed.  You have chest pain or shortness of breath.  You throw up (vomit) and your throw up looks like blood or coffee grounds.  Your poop (stool) is bloody or black. These symptoms may represent a serious problem that is an emergency. Do not wait to see if the symptoms will go away. Get medical help right away. Call your local emergency services (911 in the [place] Do not drive yourself to the hospital. Summary  Heartburn is a type of pain that can happen in the throat or chest. It can feel like a burning pain. It may also cause a bad, acid-like taste in the mouth.  You may need to avoid certain foods and drinks  to help your symptoms. Ask your doctor what foods and drinks you should avoid.  Take over-the-counter and prescription medicines only as told by your doctor. Do not take aspirin, ibuprofen, or other NSAIDs unless your doctor told you to do so.  Contact your doctor if your symptoms do not get better or they get worse. This information is not intended to replace advice given to you by your health care provider. Make sure you discuss any questions you have with your health care provider. Document Revised: 12/17/2017 Document Reviewed: 12/17/2017 Elsevier Patient Education  [place].

## 2020-11-10 NOTE — Assessment & Plan Note (Signed)
Past history - Chest tightness, coughing and wheezing for many years.  Smoking over 1 pack/day for 40+ years. 2021 spirometry was normal with 14% improvement in FEV1 post bronchodilator treatment and clinically feeling improved. Full body PFT 2021 - minimal airway obstruction and overinflation. Normal diffusion.  Interim history - increases symptoms the past 1 month. Smoking more due to stress.   ACT score 17.  Today's spirometry was normal.  Encouraged smoking cessation.  . Daily controller medication(s):  Marland Kitchen INCREASE Trelegy to 227mcg 1 puff daily from May through June. Sample given.  . Then go back to 162mcg 1 puff daily.  o Make sure you rinse your mouth after each use.  . May use albuterol rescue inhaler 2 puffs every 4 to 6 hours as needed for shortness of breath, chest tightness, coughing, and wheezing. May use albuterol rescue inhaler 2 puffs 5 to 15 minutes prior to strenuous physical activities. Monitor frequency of use.  . Get spirometry at next visit.  Marland Kitchen Recommend annual flu vaccine and COVID-19 vaccinations.

## 2020-11-10 NOTE — Assessment & Plan Note (Signed)
Stable.   See below for heartburn lifestyle modifications.  Continue omeprazole daily as needed.

## 2020-11-10 NOTE — Assessment & Plan Note (Signed)
Past history - Perennial rhinoconjunctivitis symptoms with worsening spring and fall.  Skin testing a few years ago was negative in the ENT office.  11 cats and 2 dogs at home. 2021 intradermal testing showed: positive to dog and borderline to grass pollen. Interim history - increased symptoms with pollen.   Continue environmental control measures as below.  May use over the counter antihistamines such as Zyrtec (cetirizine), Claritin (loratadine), Allegra (fexofenadine), or Xyzal (levocetirizine) daily as needed. May take twice a day if needed.  Try to take the zyrtec plain without the D. TAKE dymista (fluticasone + azelastine nasal spray combination) 1 spray per nostril twice a day.  Nasal saline spray (i.e., Simply Saline) or nasal saline lavage (i.e., NeilMed) is recommended as needed and prior to medicated nasal sprays.  May use Zaditor eye drops 1-2 times a day as needed for itchy/watery eyes.   Do not use directly over contact lens. Wait 10 minutes after application to put lens back in.

## 2020-11-10 NOTE — Progress Notes (Signed)
Follow Up Note  RE: Denise Reyes MRN: [id number] DOB: 1965-10-17 Date of Office Visit: 11/10/2020  Referring provider: Marguerita Merles, MD Primary care provider: Marguerita Merles, MD  Chief Complaint: Follow-up  History of Present Illness: I had the pleasure of seeing Denise Reyes for a follow up visit at the [facility] on 11/10/2020. She is a 55 y.o. female, who is being followed for asthma with COPD, allergic rhinoconjunctivitis and heartburn. Her previous allergy office visit was on 06/30/2020 with Dr. Maudie Mercury. Today is a regular follow up visit.  Asthma with COPD  ACT score 17 Noticed increased dry coughing for the past 1 month and using albuterol 1-2 times per week. Currently on Trelegy 178mcg 1 puff daily. Never misses a dose.  Denies any ER/urgent care visits or prednisone use since the last visit. Increased smoking since she broke up with her boyfriend of 12 years - now smoking 1.5-2 packs per day. Smokes indoors and tries to crack window/door open.  Usually flares in March through June.   Seasonal and perennial allergic rhinoconjunctivitis Taking zyrtec with the D 10mg  1-2 times per day the last few weeks, dymista 1-2 times per day. No epistaxis.  Using eye drops as needed with good benefit.   Heartburn Improved.  Not on daily omeprazole.  Assessment and Plan: Denise Reyes is a 55 y.o. female with: Asthma with COPD (chronic obstructive pulmonary disease) (HCC) Past history - Chest tightness, coughing and wheezing for many years.  Smoking over 1 pack/day for 40+ years. 2021 spirometry was normal with 14% improvement in FEV1 post bronchodilator treatment and clinically feeling improved. Full body PFT 2021 - minimal airway obstruction and overinflation. Normal diffusion.  Interim history - increases symptoms the past 1 month. Smoking more due to stress.   ACT score 17.  Today's spirometry was normal.  Encouraged smoking cessation.  . Daily controller  medication(s):  Marland Kitchen INCREASE Trelegy to 245mcg 1 puff daily from May through June. Sample given.  . Then go back to 187mcg 1 puff daily.  o Make sure you rinse your mouth after each use.  . May use albuterol rescue inhaler 2 puffs every 4 to 6 hours as needed for shortness of breath, chest tightness, coughing, and wheezing. May use albuterol rescue inhaler 2 puffs 5 to 15 minutes prior to strenuous physical activities. Monitor frequency of use.  . Get spirometry at next visit.  Marland Kitchen Recommend annual flu vaccine and COVID-19 vaccinations.   Seasonal and perennial allergic rhinoconjunctivitis Past history - Perennial rhinoconjunctivitis symptoms with worsening spring and fall.  Skin testing a few years ago was negative in the ENT office.  11 cats and 2 dogs at home. 2021 intradermal testing showed: positive to dog and borderline to grass pollen. Interim history - increased symptoms with pollen.   Continue environmental control measures as below.  May use over the counter antihistamines such as Zyrtec (cetirizine), Claritin (loratadine), Allegra (fexofenadine), or Xyzal (levocetirizine) daily as needed. May take twice a day if needed.  Try to take the zyrtec plain without the D. TAKE dymista (fluticasone + azelastine nasal spray combination) 1 spray per nostril twice a day.  Nasal saline spray (i.e., Simply Saline) or nasal saline lavage (i.e., NeilMed) is recommended as needed and prior to medicated nasal sprays.  May use Zaditor eye drops 1-2 times a day as needed for itchy/watery eyes.   Do not use directly over contact lens. Wait 10 minutes after application to put lens  back in.   Heartburn Stable.   See below for heartburn lifestyle modifications.  Continue omeprazole daily as needed.   Return in about 3 months (around 02/09/2021).  Meds ordered this encounter  Medications  . Fluticasone-Umeclidin-Vilant (TRELEGY ELLIPTA) 200-62.5-25 MCG/INH AEPB    Sig: Inhale 1 puff into the lungs  daily. Rinse mouth after each use    Dispense:  60 each    Refill:  5   Lab Orders  No laboratory test(s) ordered today    Diagnostics: Spirometry:  Tracings reviewed. Her effort: Good reproducible efforts. FVC: 2.62L FEV1: 1.82L, 81% predicted FEV1/FVC ratio: 69% Interpretation: Spirometry consistent with normal pattern.  Please see scanned spirometry results for details.  Medication List:  Current Outpatient Medications  Medication Sig Dispense Refill  . albuterol (VENTOLIN HFA) 108 (90 Base) MCG/ACT inhaler Inhale 2 puffs into the lungs every 4 (four) hours as needed for wheezing or shortness of breath (coughing, chest tightness). 18 g 2  . atorvastatin (LIPITOR) 20 MG tablet Take 20 mg by mouth every evening.    . Azelastine-Fluticasone 137-50 MCG/ACT SUSP Place 1 spray into the nose in the morning and at bedtime. 23 g 5  . calcium-vitamin D (OSCAL WITH D) 500-200 MG-UNIT tablet Take 1 tablet by mouth daily with breakfast.    . cetirizine-pseudoephedrine (ZYRTEC-D) 5-120 MG tablet Take 1 tablet by mouth 2 (two) times daily as needed.     . cyanocobalamin 1000 MCG tablet Take 1,000 mcg by mouth daily.    . Fluticasone-Umeclidin-Vilant (TRELEGY ELLIPTA) 200-62.5-25 MCG/INH AEPB Inhale 1 puff into the lungs daily. Rinse mouth after each use 60 each 5  . letrozole (FEMARA) 2.5 MG tablet Take 1 tablet (2.5 mg total) by mouth daily. 30 tablet 6  . Omega-3 Fatty Acids (OMEGA-3 FISH OIL PO) Take by mouth daily.    . vitamin C (ASCORBIC ACID) 500 MG tablet Take 500 mg by mouth daily.     No current facility-administered medications for this visit.   Allergies: No Known Allergies I reviewed her past medical history, social history, family history, and environmental history and no significant changes have been reported from her previous visit.  Review of Systems  Constitutional: Negative for appetite change, chills, fever and unexpected weight change.  HENT: Positive for congestion,  postnasal drip and rhinorrhea. Negative for sneezing and voice change.   Eyes: Negative for itching.  Respiratory: Positive for cough. Negative for chest tightness, shortness of breath and wheezing.   Cardiovascular: Negative for chest pain.  Gastrointestinal: Negative for abdominal pain.  Genitourinary: Negative for difficulty urinating.  Skin: Negative for rash.  Allergic/Immunologic: Positive for environmental allergies.   Objective: BP 112/76 (BP Location: Right Arm, Patient Position: Sitting, Cuff Size: Normal)   Pulse 100   Temp 97.7 F (36.5 C) (Temporal)   Resp 16   SpO2 99%  There is no height or weight on file to calculate BMI. Physical Exam Vitals and nursing note reviewed.  Constitutional:      Appearance: Normal appearance. She is well-developed.  HENT:     Head: Normocephalic and atraumatic.     Right Ear: Tympanic membrane and external ear normal.     Left Ear: Tympanic membrane and external ear normal.     Nose: Nose normal.     Mouth/Throat:     Mouth: Mucous membranes are moist.     Pharynx: Oropharynx is clear.  Eyes:     Conjunctiva/sclera: Conjunctivae normal.  Cardiovascular:     Rate and  Rhythm: Normal rate and regular rhythm.     Heart sounds: Normal heart sounds. No murmur heard. No friction rub. No gallop.   Pulmonary:     Effort: Pulmonary effort is normal.     Breath sounds: Normal breath sounds. No wheezing or rales.  Musculoskeletal:     Cervical back: Neck supple.  Skin:    General: Skin is warm.     Findings: No rash.  Neurological:     Mental Status: She is alert and oriented to person, place, and time.  Psychiatric:        Behavior: Behavior normal.    Previous notes and tests were reviewed. The plan was reviewed with the patient/family, and all questions/concerned were addressed.  It was my pleasure to see Denise Reyes today and participate in her care. Please feel free to contact me with any questions or concerns.  Sincerely,  Rexene Alberts, DO Allergy & Immunology  [facility] office: [place] office: [phone]

## 2021-01-27 ENCOUNTER — Ambulatory Visit
Admission: RE | Admit: 2021-01-27 | Discharge: 2021-01-27 | Disposition: A | Discharge status: Home / Self Care | Payer: 59 | Source: Ambulatory Visit | Attending: Oncology | Admitting: Oncology

## 2021-01-27 ENCOUNTER — Other Ambulatory Visit: Payer: Self-pay

## 2021-01-27 DIAGNOSIS — D0512 Intraductal carcinoma in situ of left breast: Secondary | ICD-10-CM | POA: Diagnosis not present

## 2021-01-27 DIAGNOSIS — Z1231 Encounter for screening mammogram for malignant neoplasm of breast: Secondary | ICD-10-CM | POA: Insufficient documentation

## 2021-02-03 ENCOUNTER — Other Ambulatory Visit: Payer: Self-pay

## 2021-02-03 ENCOUNTER — Encounter: Payer: Self-pay | Admitting: Oncology

## 2021-02-03 DIAGNOSIS — M8580 Other specified disorders of bone density and structure, unspecified site: Secondary | ICD-10-CM

## 2021-02-03 DIAGNOSIS — D0512 Intraductal carcinoma in situ of left breast: Secondary | ICD-10-CM

## 2021-02-03 NOTE — Telephone Encounter (Signed)
Please advise 

## 2021-02-15 NOTE — Progress Notes (Signed)
Follow Up Note  RE: Denise Reyes MRN: [id number] DOB: 1966/02/18 Date of Office Visit: 02/16/2021  Referring provider: Marguerita Merles, MD Primary care provider: Marguerita Merles, MD  Chief Complaint: Asthma (Says she is well.) and Allergic Rhinitis  (Says they are well.)  History of Present Illness: I had the pleasure of seeing Denise Reyes for a follow up visit at the [facility] [place] [facility] [place] on 02/16/2021. She is a 55 y.o. female, who is being followed for asthma with COPD, allergic rhinoconjunctivitis and heartburn. Her previous allergy office visit was on 11/10/2020 with Dr. Maudie Reyes. Today is a regular follow up visit.  Asthma with COPD ACT score 21.   Currently on Trelegy 252mcg 1 puff daily with good benefit.  She prefers to use the 235mcg dose as noted worsening symptoms on the 118mcg dose. Paid $60+ per inhaler and not sure if coupon worked.   Sometimes she notices some heavier breathing in the mornings - this occurs a few times per month.  Takes albuterol as needed with good benefit.   Denies any ER/urgent care visits or prednisone use since the last visit.  Smoking outdoors now but still smoking. Busy with selling house and moving.   Seasonal and perennial allergic rhinoconjunctivitis Only taking zyrtec, eye drops and saline spray as needed.  Stopped using dymista daily and only using as needed.  Heartburn Stable and not needed to use any meds for this.   Assessment and Plan: Denise Reyes is a 55 y.o. female with: Asthma with COPD (chronic obstructive pulmonary disease) (HCC) Past history - Chest tightness, coughing and wheezing for many years.  Smoking over 1 pack/day for 40+ years. 2021 spirometry was normal with 14% improvement in FEV1 post bronchodilator treatment and clinically feeling improved. Full body PFT 2021 - minimal airway obstruction and overinflation. Normal diffusion.  Interim history - doing better with Trelegy 240mcg dose. Smoking outdoors now.  ACT score  21. Today's spirometry was normal. Encouraged smoking cessation.  Daily controller medication(s): Continue Trelegy 222mcg 1 puff daily and rinse mouth after each use. Sample and coupon given.  May use albuterol rescue inhaler 2 puffs every 4 to 6 hours as needed for shortness of breath, chest tightness, coughing, and wheezing. May use albuterol rescue inhaler 2 puffs 5 to 15 minutes prior to strenuous physical activities. Monitor frequency of use.  Get spirometry at next visit.  Recommend annual flu vaccine and COVID-19 vaccinations.   Seasonal and perennial allergic rhinoconjunctivitis Past history - Perennial rhinoconjunctivitis symptoms with worsening spring and fall.  Skin testing a few years ago was negative in the ENT office.  11 cats and 2 dogs at home. 2021 intradermal testing showed: positive to dog and borderline to grass pollen. Interim history - stable, not using any medications on a daily basis. Continue environmental control measures as below. Use over the counter antihistamines such as Zyrtec (cetirizine), Claritin (loratadine), Allegra (fexofenadine), or Xyzal (levocetirizine) daily as needed. May take twice a day during allergy flares. May switch antihistamines every few months. May use dymista (fluticasone + azelastine nasal spray combination) 1 spray per nostril twice a day. Nasal saline spray (i.e., Simply Saline) or nasal saline lavage (i.e., NeilMed) is recommended as needed and prior to medicated nasal sprays. May use Zaditor eye drops 1-2 times a day as needed for itchy/watery eyes.  Do not use directly over contact lens. Wait 10 minutes after application to put lens back in.   Heartburn Stable.  See below for  heartburn lifestyle modifications. Continue omeprazole daily as needed.   Return in about 4 months (around 06/19/2021).  No orders of the defined types were placed in this encounter.  Lab Orders  No laboratory test(s) ordered today     Diagnostics: Spirometry:  Tracings reviewed. Her effort: Good reproducible efforts. FVC: 2.38L FEV1: 1.75L, 77% predicted FEV1/FVC ratio: 74% Interpretation: Spirometry consistent with normal pattern.  Please see scanned spirometry results for details.  Medication List:  Current Outpatient Medications  Medication Sig Dispense Refill   albuterol (VENTOLIN HFA) 108 (90 Base) MCG/ACT inhaler Inhale 2 puffs into the lungs every 4 (four) hours as needed for wheezing or shortness of breath (coughing, chest tightness). 18 g 2   atorvastatin (LIPITOR) 20 MG tablet Take 20 mg by mouth every evening.     Azelastine-Fluticasone 137-50 MCG/ACT SUSP Place 1 spray into the nose in the morning and at bedtime. 23 g 5   calcium-vitamin D (OSCAL WITH D) 500-200 MG-UNIT tablet Take 1 tablet by mouth daily with breakfast.     cetirizine-pseudoephedrine (ZYRTEC-D) 5-120 MG tablet Take 1 tablet by mouth 2 (two) times daily as needed.      cyanocobalamin 1000 MCG tablet Take 1,000 mcg by mouth daily.     Fluticasone-Umeclidin-Vilant (TRELEGY ELLIPTA) 200-62.5-25 MCG/INH AEPB Inhale 1 puff into the lungs daily. Rinse mouth after each use 60 each 5   letrozole (FEMARA) 2.5 MG tablet Take 1 tablet (2.5 mg total) by mouth daily. 30 tablet 6   Omega-3 Fatty Acids (OMEGA-3 FISH OIL PO) Take by mouth daily.     vitamin C (ASCORBIC ACID) 500 MG tablet Take 500 mg by mouth daily.     No current facility-administered medications for this visit.   Allergies: No Known Allergies I reviewed her past medical history, social history, family history, and environmental history and no significant changes have been reported from her previous visit.  Review of Systems  Constitutional:  Negative for appetite change, chills, fever and unexpected weight change.  HENT:  Negative for congestion, postnasal drip, rhinorrhea, sneezing and voice change.   Eyes:  Negative for itching.  Respiratory:  Negative for cough, chest  tightness, shortness of breath and wheezing.   Cardiovascular:  Negative for chest pain.  Gastrointestinal:  Negative for abdominal pain.  Genitourinary:  Negative for difficulty urinating.  Skin:  Negative for rash.  Allergic/Immunologic: Positive for environmental allergies.   Objective: BP 122/70   Pulse 96   Temp 97.8 F (36.6 C) (Temporal)   Resp 16   Ht 4\' 11"  (1.499 m)   Wt 135 lb 9.6 oz (61.5 kg)   SpO2 96%   BMI 27.39 kg/m  Body mass index is 27.39 kg/m. Physical Exam Vitals and nursing note reviewed.  Constitutional:      Appearance: Normal appearance. She is well-developed.  HENT:     Head: Normocephalic and atraumatic.     Right Ear: Tympanic membrane and external ear normal.     Left Ear: Tympanic membrane and external ear normal.     Nose: Nose normal.     Mouth/Throat:     Mouth: Mucous membranes are moist.     Pharynx: Oropharynx is clear.  Eyes:     Conjunctiva/sclera: Conjunctivae normal.  Cardiovascular:     Rate and Rhythm: Normal rate and regular rhythm.     Heart sounds: Normal heart sounds. No murmur heard.   No friction rub. No gallop.  Pulmonary:     Effort: Pulmonary effort is normal.  Breath sounds: Normal breath sounds. No wheezing or rales.  Musculoskeletal:     Cervical back: Neck supple.  Skin:    General: Skin is warm.     Findings: No rash.  Neurological:     Mental Status: She is alert and oriented to person, place, and time.  Psychiatric:        Behavior: Behavior normal.  Previous notes and tests were reviewed. The plan was reviewed with the patient/family, and all questions/concerned were addressed.  It was my pleasure to see Denise Reyes today and participate in her care. Please feel free to contact me with any questions or concerns.  Sincerely,  Rexene Alberts, DO Allergy & Immunology  [facility] office: [place] office: [phone]

## 2021-02-16 ENCOUNTER — Encounter: Payer: Self-pay | Admitting: Allergy

## 2021-02-16 ENCOUNTER — Ambulatory Visit (INDEPENDENT_AMBULATORY_CARE_PROVIDER_SITE_OTHER): Payer: 59 | Admitting: Allergy

## 2021-02-16 ENCOUNTER — Other Ambulatory Visit: Payer: Self-pay

## 2021-02-16 VITALS — BP 122/70 | HR 96 | Temp 97.8°F | Resp 16 | Ht 59.0 in | Wt 135.6 lb

## 2021-02-16 DIAGNOSIS — H1013 Acute atopic conjunctivitis, bilateral: Secondary | ICD-10-CM | POA: Diagnosis not present

## 2021-02-16 DIAGNOSIS — H1010 Acute atopic conjunctivitis, unspecified eye: Secondary | ICD-10-CM

## 2021-02-16 DIAGNOSIS — J449 Chronic obstructive pulmonary disease, unspecified: Secondary | ICD-10-CM

## 2021-02-16 DIAGNOSIS — J302 Other seasonal allergic rhinitis: Secondary | ICD-10-CM | POA: Diagnosis not present

## 2021-02-16 DIAGNOSIS — R12 Heartburn: Secondary | ICD-10-CM

## 2021-02-16 DIAGNOSIS — J3089 Other allergic rhinitis: Secondary | ICD-10-CM

## 2021-02-16 NOTE — Assessment & Plan Note (Signed)
Stable.   See below for heartburn lifestyle modifications.  Continue omeprazole daily as needed.

## 2021-02-16 NOTE — Assessment & Plan Note (Signed)
Past history - Chest tightness, coughing and wheezing for many years.  Smoking over 1 pack/day for 40+ years. 2021 spirometry was normal with 14% improvement in FEV1 post bronchodilator treatment and clinically feeling improved. Full body PFT 2021 - minimal airway obstruction and overinflation. Normal diffusion.  Interim history - doing better with Trelegy 272mcg dose. Smoking outdoors now.   ACT score 21.  Today's spirometry was normal.  Encouraged smoking cessation.  . Daily controller medication(s): Continue Trelegy 241mcg 1 puff daily and rinse mouth after each use. Sample and coupon given.  . May use albuterol rescue inhaler 2 puffs every 4 to 6 hours as needed for shortness of breath, chest tightness, coughing, and wheezing. May use albuterol rescue inhaler 2 puffs 5 to 15 minutes prior to strenuous physical activities. Monitor frequency of use.  . Get spirometry at next visit.  Marland Kitchen Recommend annual flu vaccine and COVID-19 vaccinations.

## 2021-02-16 NOTE — Patient Instructions (Addendum)
Asthma/copd:  It's never too late to decrease or even quit smoking! Smoke outside if possible.  Try to cut down 1 cigarette per month. Daily controller medication(s): Continue Trelegy 257mcg 1 puff daily and rinse mouth after each use. Sample and coupon given.  May use albuterol rescue inhaler 2 puffs every 4 to 6 hours as needed for shortness of breath, chest tightness, coughing, and wheezing. May use albuterol rescue inhaler 2 puffs 5 to 15 minutes prior to strenuous physical activities. Monitor frequency of use.  Breathing control goals:  Full participation in all desired activities (may need albuterol before activity) Albuterol use two times or less a week on average (not counting use with activity) Cough interfering with sleep two times or less a month Oral steroids no more than once a year No hospitalizations  Environmental allergies Past skin testing showed: positive to dog and borderline to grass pollen. Continue environmental control measures as below. Use over the counter antihistamines such as Zyrtec (cetirizine), Claritin (loratadine), Allegra (fexofenadine), or Xyzal (levocetirizine) daily as needed. May take twice a day during allergy flares. May switch antihistamines every few months. Use dymista (fluticasone + azelastine nasal spray combination) 1 spray per nostril twice a day. Nasal saline spray (i.e., Simply Saline) or nasal saline lavage (i.e., NeilMed) is recommended as needed and prior to medicated nasal sprays. May use Zaditor eye drops 1-2 times a day as needed for itchy/watery eyes.  Do not use directly over contact lens. Wait 10 minutes after application to put lens back in.   Reflux: Continue dietary and lifestyle modifications. Take omeprazole daily as needed.  Follow up in 4 months or sooner if needed.   Pet Allergen Avoidance: Contrary to popular opinion, there are no "hypoallergenic" breeds of dogs or cats. That is because people are not allergic to an  animal's hair, but to an allergen found in the animal's saliva, dander (dead skin flakes) or urine. Pet allergy symptoms typically occur within minutes. For some people, symptoms can build up and become most severe 8 to 12 hours after contact with the animal. People with severe allergies can experience reactions in public places if dander has been transported on the pet owners' clothing. Keeping an animal outdoors is only a partial solution, since homes with pets in the yard still have higher concentrations of animal allergens. Before getting a pet, ask your allergist to determine if you are allergic to animals. If your pet is already considered part of your family, try to minimize contact and keep the pet out of the bedroom and other rooms where you spend a great deal of time. As with dust mites, vacuum carpets often or replace carpet with a hardwood floor, tile or linoleum. High-efficiency particulate air (HEPA) cleaners can reduce allergen levels over time. While dander and saliva are the source of cat and dog allergens, urine is the source of allergens from rabbits, hamsters, mice and [place] pigs; so ask a non-allergic family member to clean the animal's cage. If you have a pet allergy, talk to your allergist about the potential for allergy immunotherapy (allergy shots). This strategy can often provide long-term relief. Reducing Pollen Exposure Pollen seasons: trees (spring), grass (summer) and ragweed/weeds (fall). Keep windows closed in your home and car to lower pollen exposure.  Install air conditioning in the bedroom and throughout the house if possible.  Avoid going out in dry windy days - especially early morning. Pollen counts are highest between 5 - 10 AM and on dry, hot and  windy days.  Save outside activities for late afternoon or after a heavy rain, when pollen levels are lower.  Avoid mowing of grass if you have grass pollen allergy. Be aware that pollen can also be transported  indoors on people and pets.  Dry your clothes in an automatic dryer rather than hanging them outside where they might collect pollen.  Rinse hair and eyes before bedtime.

## 2021-02-16 NOTE — Assessment & Plan Note (Signed)
Past history - Perennial rhinoconjunctivitis symptoms with worsening spring and fall.  Skin testing a few years ago was negative in the ENT office.  11 cats and 2 dogs at home. 2021 intradermal testing showed: positive to dog and borderline to grass pollen. Interim history - stable, not using any medications on a daily basis. . Continue environmental control measures as below. . Use over the counter antihistamines such as Zyrtec (cetirizine), Claritin (loratadine), Allegra (fexofenadine), or Xyzal (levocetirizine) daily as needed. May take twice a day during allergy flares. May switch antihistamines every few months. . May use dymista (fluticasone + azelastine nasal spray combination) 1 spray per nostril twice a day. . Nasal saline spray (i.e., Simply Saline) or nasal saline lavage (i.e., NeilMed) is recommended as needed and prior to medicated nasal sprays. . May use Zaditor eye drops 1-2 times a day as needed for itchy/watery eyes.  o Do not use directly over contact lens. Wait 10 minutes after application to put lens back in.

## 2021-03-11 ENCOUNTER — Inpatient Hospital Stay: Payer: 59

## 2021-03-11 ENCOUNTER — Inpatient Hospital Stay: Payer: 59 | Admitting: Oncology

## 2021-03-15 ENCOUNTER — Inpatient Hospital Stay: Payer: 59

## 2021-03-15 ENCOUNTER — Inpatient Hospital Stay: Payer: 59 | Attending: Oncology | Admitting: Oncology

## 2021-03-15 ENCOUNTER — Encounter: Payer: Self-pay | Admitting: Oncology

## 2021-03-15 VITALS — BP 123/83 | HR 90 | Temp 97.5°F | Resp 18 | Wt 134.0 lb

## 2021-03-15 DIAGNOSIS — Z79811 Long term (current) use of aromatase inhibitors: Secondary | ICD-10-CM | POA: Diagnosis not present

## 2021-03-15 DIAGNOSIS — M8580 Other specified disorders of bone density and structure, unspecified site: Secondary | ICD-10-CM

## 2021-03-15 DIAGNOSIS — D0512 Intraductal carcinoma in situ of left breast: Secondary | ICD-10-CM | POA: Diagnosis not present

## 2021-03-15 LAB — COMPREHENSIVE METABOLIC PANEL
ALT: 14 U/L (ref 0–44)
AST: 16 U/L (ref 15–41)
Albumin: 4 g/dL (ref 3.5–5.0)
Alkaline Phosphatase: 49 U/L (ref 38–126)
Anion gap: 7 (ref 5–15)
BUN: 16 mg/dL (ref 6–20)
CO2: 27 mmol/L (ref 22–32)
Calcium: 9.1 mg/dL (ref 8.9–10.3)
Chloride: 104 mmol/L (ref 98–111)
Creatinine, Ser: 0.54 mg/dL (ref 0.44–1.00)
GFR, Estimated: >60 mL/min (ref >60)
Glucose, Bld: 131 mg/dL — ABNORMAL HIGH (ref 70–99)
Potassium: 4 mmol/L (ref 3.5–5.1)
Sodium: 138 mmol/L (ref 135–145)
Total Bilirubin: 0.7 mg/dL (ref 0.3–1.2)
Total Protein: 6.8 g/dL (ref 6.5–8.1)

## 2021-03-15 LAB — CBC WITH DIFFERENTIAL/PLATELET
Abs Immature Granulocytes: 0.02 10*3/uL (ref 0.00–0.07)
Basophils Absolute: 0.1 10*3/uL (ref 0.0–0.1)
Basophils Relative: 1 %
Eosinophils Absolute: 0 10*3/uL (ref 0.0–0.5)
Eosinophils Relative: 1 %
HCT: 42.1 % (ref 36.0–46.0)
Hemoglobin: 14.3 g/dL (ref 12.0–15.0)
Immature Granulocytes: 0 %
Lymphocytes Relative: 26 %
Lymphs Abs: 2 10*3/uL (ref 0.7–4.0)
MCH: 32.2 pg (ref 26.0–34.0)
MCHC: 34 g/dL (ref 30.0–36.0)
MCV: 94.8 fL (ref 80.0–100.0)
Monocytes Absolute: 0.5 10*3/uL (ref 0.1–1.0)
Monocytes Relative: 6 %
Neutro Abs: 5 10*3/uL (ref 1.7–7.7)
Neutrophils Relative %: 66 %
Platelets: 298 10*3/uL (ref 150–400)
RBC: 4.44 MIL/uL (ref 3.87–5.11)
RDW: 12.2 % (ref 11.5–15.5)
WBC: 7.6 10*3/uL (ref 4.0–10.5)
nRBC: 0 % (ref 0.0–0.2)

## 2021-03-15 LAB — LIPID PANEL
Cholesterol: 187 mg/dL (ref 0–200)
HDL: 73 mg/dL (ref >40)
LDL Cholesterol: 100 mg/dL — ABNORMAL HIGH (ref 0–99)
Total CHOL/HDL Ratio: 2.6 RATIO
Triglycerides: 69 mg/dL (ref <150)
VLDL: 14 mg/dL (ref 0–40)

## 2021-03-15 MED ORDER — ZOLEDRONIC ACID 4 MG/100ML IV SOLN
4.0000 mg | Freq: Once | INTRAVENOUS | Status: AC
  Administered 2021-03-15: 4 mg via INTRAVENOUS
  Filled 2021-03-15: qty 100

## 2021-03-15 MED ORDER — SODIUM CHLORIDE 0.9 % IV SOLN
INTRAVENOUS | Status: DC
  Filled 2021-03-15: qty 250

## 2021-03-15 MED ORDER — LETROZOLE 2.5 MG PO TABS: 2.5000 mg | ORAL_TABLET | Freq: Every day | ORAL | 5 refills | Status: DC

## 2021-03-15 NOTE — Patient Instructions (Signed)
[facility]  Discharge Instructions: Thank you for choosing [facility] to provide your oncology and hematology care.  If you have a lab appointment with the Cancer Center, please go directly to the Cancer Center and check in at the registration area.  Wear comfortable clothing and clothing appropriate for easy access to any Portacath or PICC line.   We strive to give you quality time with your provider. You may need to reschedule your appointment if you arrive late (15 or more minutes).  Arriving late affects you and other patients whose appointments are after yours.  Also, if you miss three or more appointments without notifying the office, you may be dismissed from the clinic at the provider's discretion.      For prescription refill requests, have your pharmacy contact our office and allow 72 hours for refills to be completed.    Today you received the following : Zometa    To help prevent nausea and vomiting after your treatment, we encourage you to take your nausea medication as directed.  BELOW ARE SYMPTOMS THAT SHOULD BE REPORTED IMMEDIATELY: . *FEVER GREATER THAN 100.4 F (38 C) OR HIGHER . *CHILLS OR SWEATING . *NAUSEA AND VOMITING THAT IS NOT CONTROLLED WITH YOUR NAUSEA MEDICATION . *UNUSUAL SHORTNESS OF BREATH . *UNUSUAL BRUISING OR BLEEDING . *URINARY PROBLEMS (pain or burning when urinating, or frequent urination) . *BOWEL PROBLEMS (unusual diarrhea, constipation, pain near the anus) . TENDERNESS IN MOUTH AND THROAT WITH OR WITHOUT PRESENCE OF ULCERS (sore throat, sores in mouth, or a toothache) . UNUSUAL RASH, SWELLING OR PAIN  . UNUSUAL VAGINAL DISCHARGE OR ITCHING   Items with * indicate a potential emergency and should be followed up as soon as possible or go to the Emergency Department if any problems should occur.  Please show the CHEMOTHERAPY ALERT CARD or IMMUNOTHERAPY ALERT CARD at check-in to the Emergency  Department and triage nurse.  Should you have questions after your visit or need to cancel or reschedule your appointment, please contact [facility]  [phone] and follow the prompts.  Office hours are 8:00 a.m. to 4:30 p.m. Monday - Friday. Please note that voicemails left after 4:00 p.m. may not be returned until the following business day.  We are closed weekends and major holidays. You have access to a nurse at all times for urgent questions. Please call the main number to the clinic [phone] and follow the prompts.  For any non-urgent questions, you may also contact your provider using MyChart. We now offer e-Visits for anyone 18 and older to request care online for non-urgent symptoms. For details visit [email or web address].   Also download the MyChart app! Go to the app store, search "MyChart", open the app, select [facility], and log in with your MyChart username and password.  Due to Covid, a mask is required upon entering the hospital/clinic. If you do not have a mask, one will be given to you upon arrival. For doctor visits, patients may have 1 support person aged 18 or older with them. For treatment visits, patients cannot have anyone with them due to current Covid guidelines and our immunocompromised population.  

## 2021-03-15 NOTE — Progress Notes (Signed)
Pt here for follow up. No new concerns voiced.   

## 2021-03-15 NOTE — Progress Notes (Signed)
Hematology/Oncology follow up  note [facility] Telephone:([phone]) [phone] Fax:([phone]) [phone]   Patient Care Team: Marguerita Merles, MD as PCP - General (Family Medicine)  REFERRING PROVIDER: Dr.Cooper. REASON FOR VISIT Follow up for treatment of :  DCIS  HISTORY OF PRESENTING ILLNESS:  Denise Reyes is a  55 y.o.  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   Patient had a mammogram done in May 2019.  Mammogram showed indeterminate calcifications in the upper outer quadrant of the left breast for which biopsy is indicated.  Patient is status post stereotactic guided core biopsy of the left breast calcifications. 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   Patient underwent left breast lumpectomy on 01/23/2018, pathology showed fibrocystic change with microcalcifications Patient was referred to oncology for further evaluation and management.   # Patient's case was discussed at breast tumor board on 02/11/2018. patient that although her DCIS foci is very small, her breast has very proliferative background, consistent with hormone replacement treatment.  Status post adjuvant RT completed in September 2019. Letrozole was started in September 2019   # Postmenopausal status: obtained patient's GYN operation report from Dr. Louann Liv office. Patient is in menopasue state as she had had bilateral salpingo-oophorectomy and hysterectomy on 12/30/2013. Pathology reviewed.  Showed benign essentially unremarkable cervix, benign proliferative endometrium associated with benign endometrial polyp.  Adenomyosis, leiomyomata, benign right ovary with small foci of endometriosis, benign right paratubal cyst essentially unremarkable left ovary and the fallopian tube.  Marland Kitchen #We obtained her records  from GYN office and reviewed. DEXA done in May 2019 showed left femoral neck T score -1.1, AP spine -0.3, right femoral neck -1.1  #September 2019, started on letrozole  INTERVAL HISTORY Denise Reyes is a 55 y.o. female who has above history reviewed by me today presents for management of DCIS. She reports feeling well. No new breast concerns.  Takes Letrozole, manageable side effects.     Review of Systems  Constitutional:  Negative for chills, fever, malaise/fatigue and weight loss.  HENT:  Negative for nosebleeds and sore throat.   Eyes:  Negative for double vision, photophobia and redness.  Respiratory:  Negative for cough, shortness of breath and wheezing.   Cardiovascular:  Negative for chest pain, palpitations, orthopnea and leg swelling.  Gastrointestinal:  Negative for abdominal pain, blood in stool, nausea and vomiting.  Genitourinary:  Negative for dysuria.  Musculoskeletal:  Positive for joint pain. Negative for back pain, myalgias and neck pain.  Skin:  Negative for itching and rash.  Neurological:  Negative for dizziness, tingling and tremors.  Endo/Heme/Allergies:  Negative for environmental allergies. Does not bruise/bleed easily.       Hot flash  Psychiatric/Behavioral:  Negative for depression and hallucinations.    MEDICAL HISTORY:  Past Medical History:  Diagnosis Date   Allergy    PT CURRENTLY TAKING ABX AND PREDNISONE GIVEN BY DR Tami Ribas ON 01-16-18 FOR INFECTION THAT SHE GETS THIS TIME EVERY YEAR   Anxiety    Arthritis    knees, wrists   Asthma    allergy induced    Breast cancer ([place])    Cancer ([place])    GERD (gastroesophageal reflux disease)    occ-no meds   History of kidney stones    h/o   Personal history of radiation therapy 2019  LEFT lumpectomy   Problems with swallowing and mastication    Psoriasis    Recurrent upper respiratory infection (URI)    Special screening for malignant neoplasms, colon    Wears contact lenses     SURGICAL  HISTORY: Past Surgical History:  Procedure Laterality Date   BILATERAL SALPINGOOPHORECTOMY Bilateral 12/30/2013   BREAST BIOPSY Left    DUCTAL CARCINOMA IN SITU (DCIS), LOW NUCLEAR GRADE.   BREAST BIOPSY Left 01/23/2018   Procedure: BREAST BIOPSY/PARTIAL MASTECTOMY WITH NEEDLE LOCALIZATION;  Surgeon: Florene Glen, MD;  Location: [facility];  Service: General;  Laterality: Left;   BREAST EXCISIONAL BIOPSY Left 01/23/2018   lumectomy with NL    BREAST LUMPECTOMY Left 12/2017   DUCTAL CARCINOMA IN SITU   COLONOSCOPY WITH PROPOFOL N/A 07/07/2016   Procedure: COLONOSCOPY WITH PROPOFOL;  Surgeon: Lucilla Lame, MD;  Location: [facility];  Service: Endoscopy;  Laterality: N/A;   ESOPHAGOGASTRODUODENOSCOPY (EGD) WITH PROPOFOL N/A 07/07/2016   Procedure: ESOPHAGOGASTRODUODENOSCOPY (EGD) WITH PROPOFOL;  Surgeon: Lucilla Lame, MD;  Location: [facility];  Service: Endoscopy;  Laterality: N/A;   LAPAROSCOPIC ASSISTED VAGINAL HYSTERECTOMY  12/30/2013   URETHRA SURGERY     14 months old    SOCIAL HISTORY: Social History   Socioeconomic History   Marital status: Single    Spouse name: Not on file   Number of children: Not on file   Years of education: Not on file   Highest education level: Not on file  Occupational History   Not on file  Tobacco Use   Smoking status: Every Day    Packs/day: 1.00    Years: 35.00    Pack years: 35.00    Types: Cigarettes   Smokeless tobacco: Never  Vaping Use   Vaping Use: Never used  Substance and Sexual Activity   Alcohol use: Yes    Alcohol/week: 2.0 standard drinks    Types: 2 Cans of beer per week    Comment: occ weekend beers   Drug use: No   Sexual activity: Not on file  Other Topics Concern   Not on file  Social History Narrative   Not on file   Social Determinants of Health   Financial Resource Strain: Not on file  Food Insecurity: Not on file  Transportation Needs: Not on file  Physical Activity: Not on file  Stress:  Not on file  Social Connections: Not on file  Intimate Partner Violence: Not on file    FAMILY HISTORY: Family History  Problem Relation Age of Onset   Hypertension Mother    High Cholesterol Mother    Allergic rhinitis Mother    Asthma Mother    Hypertension Father    Heart disease Father    COPD Father    High Cholesterol Father    Heart disease Paternal Grandfather    Cancer Sister        ovarian   Allergic rhinitis Sister    Cancer Maternal Aunt    Cancer Paternal Grandmother 90       breast   Breast cancer Paternal Grandmother    Angioedema Neg Hx    Atopy Neg Hx    Eczema Neg Hx    Immunodeficiency Neg Hx    Urticaria Neg Hx     ALLERGIES:  has No Known Allergies.  MEDICATIONS:  Current Outpatient Medications  Medication Sig Dispense Refill   albuterol (VENTOLIN HFA) 108 (90 Base) MCG/ACT inhaler Inhale 2 puffs into the lungs every 4 (four) hours  as needed for wheezing or shortness of breath (coughing, chest tightness). 18 g 2   atorvastatin (LIPITOR) 20 MG tablet Take 20 mg by mouth every evening.     Azelastine-Fluticasone 137-50 MCG/ACT SUSP Place 1 spray into the nose in the morning and at bedtime. 23 g 5   calcium-vitamin D (OSCAL WITH D) 500-200 MG-UNIT tablet Take 1 tablet by mouth daily with breakfast.     cetirizine-pseudoephedrine (ZYRTEC-D) 5-120 MG tablet Take 1 tablet by mouth 2 (two) times daily as needed.      cyanocobalamin 1000 MCG tablet Take 1,000 mcg by mouth daily.     Fluticasone-Umeclidin-Vilant (TRELEGY ELLIPTA) 200-62.5-25 MCG/INH AEPB Inhale 1 puff into the lungs daily. Rinse mouth after each use 60 each 5   Omega-3 Fatty Acids (OMEGA-3 FISH OIL PO) Take by mouth daily.     vitamin C (ASCORBIC ACID) 500 MG tablet Take 500 mg by mouth daily.     letrozole (FEMARA) 2.5 MG tablet Take 1 tablet (2.5 mg total) by mouth daily. 30 tablet 5   No current facility-administered medications for this visit.     PHYSICAL EXAMINATION: ECOG  PERFORMANCE STATUS: 0 - Asymptomatic Vitals:   03/15/21 1258  BP: 123/83  Pulse: 90  Resp: 18  Temp: (!) 97.5 F (36.4 C)   Filed Weights   03/15/21 1258  Weight: 134 lb (60.8 kg)    Physical Exam Constitutional:      General: She is not in acute distress. HENT:     Head: Normocephalic and atraumatic.  Eyes:     General: No scleral icterus.    Conjunctiva/sclera: Conjunctivae normal.     Pupils: Pupils are equal, round, and reactive to light.  Cardiovascular:     Rate and Rhythm: Normal rate and regular rhythm.     Heart sounds: Normal heart sounds.  Pulmonary:     Effort: Pulmonary effort is normal. No respiratory distress.     Breath sounds: Normal breath sounds. No wheezing or rales.  Chest:     Chest wall: No tenderness.  Abdominal:     General: Bowel sounds are normal. There is no distension.     Palpations: Abdomen is soft. There is no mass.     Tenderness: There is no abdominal tenderness.  Musculoskeletal:        General: No deformity. Normal range of motion.     Cervical back: Normal range of motion and neck supple.  Lymphadenopathy:     Cervical: No cervical adenopathy.  Skin:    General: Skin is warm and dry.     Findings: No erythema or rash.  Neurological:     Mental Status: She is alert and oriented to person, place, and time. Mental status is at baseline.     Cranial Nerves: No cranial nerve deficit.     Coordination: Coordination normal.  Psychiatric:        Mood and Affect: Mood normal.        Behavior: Behavior normal.        Thought Content: Thought content normal.      LABORATORY DATA:  I have reviewed the data as listed Lab Results  Component Value Date   WBC 7.6 03/15/2021   HGB 14.3 03/15/2021   HCT 42.1 03/15/2021   MCV 94.8 03/15/2021   PLT 298 03/15/2021   Recent Labs    09/10/20 1256 03/15/21 1248  NA 138 138  K 4.2 4.0  CL 103 104  CO2 26 27  GLUCOSE  105* 131*  BUN 15 16  CREATININE 0.68 0.54  CALCIUM 9.4 9.1   GFRNONAA >60 >60  PROT 7.2 6.8  ALBUMIN 4.1 4.0  AST 23 16  ALT 22 14  ALKPHOS 53 49  BILITOT 0.8 0.7    Iron/TIBC/Ferritin/ %Sat No results found for: IRON, TIBC, FERRITIN, IRONPCTSAT   RADIOGRAPHIC STUDIES: I have personally reviewed the radiological images as listed and agreed with the findings in the report. MM 3D SCREEN BREAST BILATERAL  Result Date: 01/28/2021 CLINICAL DATA:  Screening. EXAM: DIGITAL SCREENING BILATERAL MAMMOGRAM WITH TOMOSYNTHESIS AND CAD TECHNIQUE: Bilateral screening digital craniocaudal and mediolateral oblique mammograms were obtained. Bilateral screening digital breast tomosynthesis was performed. The images were evaluated with computer-aided detection. COMPARISON:  Previous exam(s). ACR Breast Density Category c: The breast tissue is heterogeneously dense, which may obscure small masses. FINDINGS: There are no findings suspicious for malignancy. IMPRESSION: No mammographic evidence of malignancy. A result letter of this screening mammogram will be mailed directly to the patient. RECOMMENDATION: Screening mammogram in one year. (Code:SM-B-01Y) BI-RADS CATEGORY  1: Negative. Electronically Signed   By: Lovey Newcomer M.D.   On: 01/28/2021 16:39     ASSESSMENT & PLAN:  1. Osteopenia, unspecified location   2. Ductal carcinoma in situ (DCIS) of left breast   3. Aromatase inhibitor use    # DCIS s/p lumpectomy and adjuvant RT.  Clinically patient is doing well.   Labs are reviewed and discussed with patient. Continue Letrozole.  Bilateral mammogram  Labs are reviewed and discussed with patient. Continue Letrozole, plan for total 5 years.  01/27/2021 Bilateral mammogram images reviewed and discussed with patient   #Osteopenia in the context of chronic aromatase inhibitor use. Continue Zometa every 6 months Continue vitamin D and calcium treatments. Patient gets DEXA through gynecologist office, most recent DEXA was done in 2021- will obtain records.     Orders Placed This Encounter  Procedures   Comprehensive metabolic panel    Standing Status:   Future    Standing Expiration Date:   03/15/2022   CBC with Differential/Platelet    Standing Status:   Future    Standing Expiration Date:   03/15/2022     All questions were answered. The patient knows to call the clinic with any problems questions or concerns.  Return of visit: 6 months.  Earlie Server, MD, PhD Hematology Oncology [facility] Pager- [phone] 03/15/2021

## 2021-04-21 ENCOUNTER — Encounter: Payer: Self-pay | Admitting: Radiation Oncology

## 2021-04-21 ENCOUNTER — Ambulatory Visit
Admission: RE | Admit: 2021-04-21 | Discharge: 2021-04-21 | Disposition: A | Discharge status: Home / Self Care | Payer: 59 | Source: Ambulatory Visit | Attending: Radiation Oncology | Admitting: Radiation Oncology

## 2021-04-21 VITALS — BP 123/90 | HR 92 | Temp 98.8°F | Resp 16 | Wt 131.0 lb

## 2021-04-21 DIAGNOSIS — Z923 Personal history of irradiation: Secondary | ICD-10-CM | POA: Diagnosis not present

## 2021-04-21 DIAGNOSIS — Z170 Estrogen receptor positive status [ER+]: Secondary | ICD-10-CM | POA: Diagnosis not present

## 2021-04-21 DIAGNOSIS — D0512 Intraductal carcinoma in situ of left breast: Secondary | ICD-10-CM | POA: Insufficient documentation

## 2021-04-21 DIAGNOSIS — Z79811 Long term (current) use of aromatase inhibitors: Secondary | ICD-10-CM | POA: Insufficient documentation

## 2021-04-21 NOTE — Progress Notes (Signed)
Radiation Oncology Follow up Note  Name: Denise Reyes   Date:   04/21/2021 MRN:  [id number] DOB: May 28, 1966    This 55 y.o. female presents to the clinic today for 3-year follow-up status post whole breast radiation to her left breast for ER/PR positive ductal carcinoma in situ.  REFERRING PROVIDER: Marguerita Merles, MD  HPI: Patient is a 55 year old female now seen out 3 years having pleated whole breast radiation to her left breast for ER/PR positive ductal carcinoma in situ.  Seen today in routine follow-up she is doing well.  She specifically denies breast tenderness cough or bone pain..  She had mammograms in June which I have reviewed were BI-RADS 1 negative.  She is currently on Femara tolerating it well without side effect.  COMPLICATIONS OF TREATMENT: none  FOLLOW UP COMPLIANCE: keeps appointments   PHYSICAL EXAM:  BP 123/90 (BP Location: Right Arm, Patient Position: Sitting, Cuff Size: Normal)   Pulse 92   Temp 98.8 F (37.1 C) (Tympanic)   Resp 16   Wt 131 lb (59.4 kg)   SpO2 97%   BMI 26.46 kg/m  Lungs are clear to A&P cardiac examination essentially unremarkable with regular rate and rhythm. No dominant mass or nodularity is noted in either breast in 2 positions examined. Incision is well-healed. No axillary or supraclavicular adenopathy is appreciated. Cosmetic result is excellent.  Well-developed well-nourished patient in NAD. HEENT reveals PERLA, EOMI, discs not visualized.  Oral cavity is clear. No oral mucosal lesions are identified. Neck is clear without evidence of cervical or supraclavicular adenopathy. Lungs are clear to A&P. Cardiac examination is essentially unremarkable with regular rate and rhythm without murmur rub or thrill. Abdomen is benign with no organomegaly or masses noted. Motor sensory and DTR levels are equal and symmetric in the upper and lower extremities. Cranial nerves II through XII are grossly intact. Proprioception is intact. No peripheral  adenopathy or edema is identified. No motor or sensory levels are noted. Crude visual fields are within normal range.  RADIOLOGY RESULTS: No current films to review  PLAN: Present time patient is doing well with no evidence of disease.  On pleased with her overall progress.  I have asked to see her back in 1 year for follow-up I will then discontinue follow-up care.  She continues on letrozole without side effect.  Patient knows to call with any concerns.  I would like to take this opportunity to thank you for allowing me to participate in the care of your patient.Noreene Filbert, MD

## 2021-06-15 ENCOUNTER — Other Ambulatory Visit: Payer: Self-pay | Admitting: Allergy

## 2021-06-22 ENCOUNTER — Ambulatory Visit: Payer: 59 | Admitting: Allergy

## 2021-07-19 ENCOUNTER — Other Ambulatory Visit: Payer: Self-pay | Admitting: Allergy

## 2021-07-20 ENCOUNTER — Other Ambulatory Visit: Payer: Self-pay | Admitting: *Deleted

## 2021-07-20 ENCOUNTER — Telehealth: Payer: Self-pay | Admitting: Allergy

## 2021-07-20 MED ORDER — TRELEGY ELLIPTA 200-62.5-25 MCG/ACT IN AEPB: INHALATION_SPRAY | RESPIRATORY_TRACT | 0 refills | Status: DC

## 2021-07-20 NOTE — Telephone Encounter (Signed)
Spoke with patient, informed her that a courtesy refill has been sent to the pharmacy. Informed patient to keep her appointment in January, patient verbalized understanding.

## 2021-07-20 NOTE — Telephone Encounter (Signed)
Patient is requesting refill for her trelegy. Patient has an appointment in Jan. 2023. Patient needs refill to get her to her appointment.   [place], [place] [address]  Patient is requesting call once refill is sent in, [phone]

## 2021-08-16 NOTE — Progress Notes (Signed)
Follow Up Note  RE: Denise Reyes MRN: [id number] DOB: 01-Oct-1965 Date of Office Visit: 08/17/2021  Referring provider: Marguerita Merles, MD Primary care provider: Marguerita Merles, MD  Chief Complaint: Follow-up  History of Present Illness: I had the pleasure of seeing Denise Reyes for a follow up visit at the [facility] on 08/17/2021. She is a 56 y.o. female, who is being followed for asthma with COPD, allergic rhinoconjunctivitis and heartburn. Her previous allergy office visit was on 02/16/2021 with Dr. Maudie Mercury. Today is a regular follow up visit.  Asthma with COPD  Smoking about 1-2 packer per day - smoking everywhere including indoors. Moved to a new home that doesn't have a good outdoor/patio area.  Breathing is about the same - sometimes gets dyspnea on exertion.   Taking Trelegy 242mcg 1 puff once a day and she has symptoms if she forgets to take it.  Only using albuterol every few weeks with good benefit.  Denies any ER/urgent care visits or prednisone use since the last visit. Did not get flu or Covid-19 booster.   Seasonal and perennial allergic rhinoconjunctivitis No daily meds. Using saline spray only as needed.  No eye drop use.   Heartburn Comes and goes. Takes Tums only.    Assessment and Plan: Denise Reyes is a 56 y.o. female with: Asthma with COPD (chronic obstructive pulmonary disease) (HCC) Past history - Chest tightness, coughing and wheezing for many years. Smoking over 1 pack/day for 40+ years. 2021 spirometry was normal with 14% improvement in FEV1 post bronchodilator treatment and clinically feeling improved. Full body PFT 2021 - minimal airway obstruction and overinflation. Normal diffusion.  Interim history - stable. Smoking indoors now 1-2 packs per day. ACT score 22. Today's spirometry showed some mild obstruction. Encouraged smoking cessation and smoking outdoors.  Daily controller medication(s): Continue Trelegy 223mcg 1 puff daily and  rinse mouth after each use. Sample and coupon given.  May use albuterol rescue inhaler 2 puffs every 4 to 6 hours as needed for shortness of breath, chest tightness, coughing, and wheezing. May use albuterol rescue inhaler 2 puffs 5 to 15 minutes prior to strenuous physical activities. Monitor frequency of use.  Get spirometry at next visit. Recommend annual flu and Covid-19 booster.  Seasonal and perennial allergic rhinoconjunctivitis Past history - Perennial rhinoconjunctivitis symptoms with worsening spring and fall.  Skin testing a few years ago was negative in the ENT office.  11 cats and 2 dogs at home. 2021 intradermal testing showed: positive to dog and borderline to grass pollen. Interim history - controlled with no daily meds.  Continue environmental control measures. Use over the counter antihistamines such as Zyrtec (cetirizine), Claritin (loratadine), Allegra (fexofenadine), or Xyzal (levocetirizine) daily as needed. May take twice a day during allergy flares. May switch antihistamines every few months. Use dymista (fluticasone + azelastine nasal spray combination) 1 spray per nostril twice a day as needed.  Nasal saline spray (i.e., Simply Saline) or nasal saline lavage (i.e., NeilMed) is recommended as needed and prior to medicated nasal sprays. May use Zaditor eye drops 1-2 times a day as needed for itchy/watery eyes.  Do not use directly over contact lens. Wait 10 minutes after application to put lens back in.   Heartburn Comes and goes.  Continue dietary and lifestyle modifications. Take omeprazole daily as needed.  Return in about 4 months (around 12/15/2021).  Meds ordered this encounter  Medications   albuterol (VENTOLIN HFA) 108 (90 Base) MCG/ACT  inhaler    Sig: Inhale 2 puffs into the lungs every 4 (four) hours as needed for wheezing or shortness of breath (coughing, chest tightness).    Dispense:  18 g    Refill:  1   Azelastine-Fluticasone 137-50 MCG/ACT SUSP     Sig: Place 1 spray into the nose in the morning and at bedtime. For nasal symptoms.    Dispense:  23 g    Refill:  5    Please keep on the file for when the patient needs to pick up.   TRELEGY ELLIPTA 200-62.5-25 MCG/ACT AEPB    Sig: INHALE 1 PUFF INTO THE LUNGS DAILY. RINSE MOUTH AFTER USE    Dispense:  60 each    Refill:  5   Lab Orders  No laboratory test(s) ordered today    Diagnostics: Spirometry:  Tracings reviewed. Her effort: Good reproducible efforts. FVC: 2.55L FEV1: 1.70L, 77% predicted FEV1/FVC ratio: 67% Interpretation: Spirometry consistent with mild obstructive disease.  Please see scanned spirometry results for details.  Medication List:  Current Outpatient Medications  Medication Sig Dispense Refill   atorvastatin (LIPITOR) 20 MG tablet Take 20 mg by mouth every evening.     calcium-vitamin D (OSCAL WITH D) 500-200 MG-UNIT tablet Take 1 tablet by mouth daily with breakfast.     cyanocobalamin 1000 MCG tablet Take 1,000 mcg by mouth daily.     letrozole (FEMARA) 2.5 MG tablet Take 1 tablet (2.5 mg total) by mouth daily. 30 tablet 5   Omega-3 Fatty Acids (OMEGA-3 FISH OIL PO) Take by mouth daily.     vitamin C (ASCORBIC ACID) 500 MG tablet Take 500 mg by mouth daily.     albuterol (VENTOLIN HFA) 108 (90 Base) MCG/ACT inhaler Inhale 2 puffs into the lungs every 4 (four) hours as needed for wheezing or shortness of breath (coughing, chest tightness). 18 g 1   Azelastine-Fluticasone 137-50 MCG/ACT SUSP Place 1 spray into the nose in the morning and at bedtime. For nasal symptoms. 23 g 5   TRELEGY ELLIPTA 200-62.5-25 MCG/ACT AEPB INHALE 1 PUFF INTO THE LUNGS DAILY. RINSE MOUTH AFTER USE 60 each 5   No current facility-administered medications for this visit.   Allergies: No Known Allergies I reviewed her past medical history, social history, family history, and environmental history and no significant changes have been reported from her previous visit.  Review of  Systems  Constitutional:  Negative for appetite change, chills, fever and unexpected weight change.  HENT:  Negative for congestion, postnasal drip, rhinorrhea, sneezing and voice change.   Eyes:  Negative for itching.  Respiratory:  Positive for shortness of breath. Negative for cough, chest tightness and wheezing.        With exertion at times.  Cardiovascular:  Negative for chest pain.  Gastrointestinal:  Negative for abdominal pain.  Genitourinary:  Negative for difficulty urinating.  Skin:  Negative for rash.  Allergic/Immunologic: Positive for environmental allergies.   Objective: BP 112/60 (BP Location: Right Arm, Patient Position: Sitting, Cuff Size: Normal)    Pulse (!) 127    Temp (!) 96.7 F (35.9 C) (Temporal)    Resp 18    SpO2 97%  There is no height or weight on file to calculate BMI. Physical Exam Vitals and nursing note reviewed.  Constitutional:      Appearance: Normal appearance. She is well-developed.  HENT:     Head: Normocephalic and atraumatic.     Right Ear: Tympanic membrane and external ear normal.  Left Ear: Tympanic membrane and external ear normal.     Nose: Nose normal.     Mouth/Throat:     Mouth: Mucous membranes are moist.     Pharynx: Oropharynx is clear.  Eyes:     Conjunctiva/sclera: Conjunctivae normal.  Cardiovascular:     Rate and Rhythm: Normal rate and regular rhythm.     Heart sounds: Normal heart sounds. No murmur heard.   No friction rub. No gallop.  Pulmonary:     Effort: Pulmonary effort is normal.     Breath sounds: Normal breath sounds. No wheezing or rales.  Musculoskeletal:     Cervical back: Neck supple.  Skin:    General: Skin is warm.     Findings: No rash.  Neurological:     Mental Status: She is alert and oriented to person, place, and time.  Psychiatric:        Behavior: Behavior normal.   Previous notes and tests were reviewed. The plan was reviewed with the patient/family, and all questions/concerned were  addressed.  It was my pleasure to see Denise Reyes today and participate in her care. Please feel free to contact me with any questions or concerns.  Sincerely,  Rexene Alberts, DO Allergy & Immunology  [facility] office: [place] office: [phone]

## 2021-08-17 ENCOUNTER — Encounter: Payer: Self-pay | Admitting: Allergy

## 2021-08-17 ENCOUNTER — Other Ambulatory Visit: Payer: Self-pay

## 2021-08-17 ENCOUNTER — Ambulatory Visit (INDEPENDENT_AMBULATORY_CARE_PROVIDER_SITE_OTHER): Payer: 59 | Admitting: Allergy

## 2021-08-17 VITALS — BP 112/60 | HR 127 | Temp 96.7°F | Resp 18

## 2021-08-17 DIAGNOSIS — R12 Heartburn: Secondary | ICD-10-CM

## 2021-08-17 DIAGNOSIS — J302 Other seasonal allergic rhinitis: Secondary | ICD-10-CM | POA: Diagnosis not present

## 2021-08-17 DIAGNOSIS — H1010 Acute atopic conjunctivitis, unspecified eye: Secondary | ICD-10-CM

## 2021-08-17 DIAGNOSIS — H1013 Acute atopic conjunctivitis, bilateral: Secondary | ICD-10-CM

## 2021-08-17 DIAGNOSIS — J449 Chronic obstructive pulmonary disease, unspecified: Secondary | ICD-10-CM

## 2021-08-17 MED ORDER — TRELEGY ELLIPTA 200-62.5-25 MCG/ACT IN AEPB: INHALATION_SPRAY | RESPIRATORY_TRACT | 5 refills | Status: DC

## 2021-08-17 MED ORDER — ALBUTEROL SULFATE HFA 108 (90 BASE) MCG/ACT IN AERS: 2.0000 | INHALATION_SPRAY | RESPIRATORY_TRACT | 1 refills | Status: DC | PRN

## 2021-08-17 MED ORDER — AZELASTINE-FLUTICASONE 137-50 MCG/ACT NA SUSP: 1.0000 | Freq: Two times a day (BID) | NASAL | 5 refills | Status: DC

## 2021-08-17 NOTE — Assessment & Plan Note (Signed)
Past history - Chest tightness, coughing and wheezing for many years. Smoking over 1 pack/day for 40+ years. 2021 spirometry was normal with 14% improvement in FEV1 post bronchodilator treatment and clinically feeling improved. Full body PFT 2021 - minimal airway obstruction and overinflation. Normal diffusion.  Interim history - stable. Smoking indoors now 1-2 packs per day.  ACT score 22.  Today's spirometry showed some mild obstruction.  Encouraged smoking cessation and smoking outdoors.   Daily controller medication(s): Continue Trelegy 263mcg 1 puff daily and rinse mouth after each use. Sample and coupon given.   May use albuterol rescue inhaler 2 puffs every 4 to 6 hours as needed for shortness of breath, chest tightness, coughing, and wheezing. May use albuterol rescue inhaler 2 puffs 5 to 15 minutes prior to strenuous physical activities. Monitor frequency of use.   Get spirometry at next visit.  Recommend annual flu and Covid-19 booster.

## 2021-08-17 NOTE — Patient Instructions (Addendum)
Asthma/copd:  It's never too late to decrease or even quit smoking! Smoke outside if possible.  Try to cut down 1 cigarette per month. Daily controller medication(s): Continue Trelegy 266mcg 1 puff daily and rinse mouth after each use. Sample and coupon given.  May use albuterol rescue inhaler 2 puffs every 4 to 6 hours as needed for shortness of breath, chest tightness, coughing, and wheezing. May use albuterol rescue inhaler 2 puffs 5 to 15 minutes prior to strenuous physical activities. Monitor frequency of use.  Breathing control goals:  Full participation in all desired activities (may need albuterol before activity) Albuterol use two times or less a week on average (not counting use with activity) Cough interfering with sleep two times or less a month Oral steroids no more than once a year No hospitalizations  Environmental allergies Past skin testing showed: positive to dog and borderline to grass pollen. Continue environmental control measures. Use over the counter antihistamines such as Zyrtec (cetirizine), Claritin (loratadine), Allegra (fexofenadine), or Xyzal (levocetirizine) daily as needed. May take twice a day during allergy flares. May switch antihistamines every few months. Use dymista (fluticasone + azelastine nasal spray combination) 1 spray per nostril twice a day as needed.  Nasal saline spray (i.e., Simply Saline) or nasal saline lavage (i.e., NeilMed) is recommended as needed and prior to medicated nasal sprays. May use Zaditor eye drops 1-2 times a day as needed for itchy/watery eyes.  Do not use directly over contact lens. Wait 10 minutes after application to put lens back in.   Reflux: Continue dietary and lifestyle modifications. Take omeprazole daily as needed.  Follow up in 4 months or sooner if needed.

## 2021-08-17 NOTE — Assessment & Plan Note (Signed)
Past history - Perennial rhinoconjunctivitis symptoms with worsening spring and fall.  Skin testing a few years ago was negative in the ENT office.  11 cats and 2 dogs at home. 2021 intradermal testing showed: positive to dog and borderline to grass pollen. Interim history - controlled with no daily meds.   Continue environmental control measures.  Use over the counter antihistamines such as Zyrtec (cetirizine), Claritin (loratadine), Allegra (fexofenadine), or Xyzal (levocetirizine) daily as needed. May take twice a day during allergy flares. May switch antihistamines every few months.  Use dymista (fluticasone + azelastine nasal spray combination) 1 spray per nostril twice a day as needed.   Nasal saline spray (i.e., Simply Saline) or nasal saline lavage (i.e., NeilMed) is recommended as needed and prior to medicated nasal sprays.  May use Zaditor eye drops 1-2 times a day as needed for itchy/watery eyes.  o Do not use directly over contact lens. Wait 10 minutes after application to put lens back in.

## 2021-08-17 NOTE — Assessment & Plan Note (Signed)
Comes and goes.   Continue dietary and lifestyle modifications.  Take omeprazole daily as needed.

## 2021-09-13 ENCOUNTER — Inpatient Hospital Stay (HOSPITAL_BASED_OUTPATIENT_CLINIC_OR_DEPARTMENT_OTHER): Payer: 59 | Admitting: Oncology

## 2021-09-13 ENCOUNTER — Other Ambulatory Visit: Payer: Self-pay

## 2021-09-13 ENCOUNTER — Inpatient Hospital Stay: Payer: 59

## 2021-09-13 ENCOUNTER — Encounter: Payer: Self-pay | Admitting: Oncology

## 2021-09-13 ENCOUNTER — Inpatient Hospital Stay: Payer: 59 | Attending: Oncology

## 2021-09-13 VITALS — BP 114/77 | HR 106 | Temp 98.4°F | Resp 18 | Wt 132.0 lb

## 2021-09-13 DIAGNOSIS — M8580 Other specified disorders of bone density and structure, unspecified site: Secondary | ICD-10-CM | POA: Insufficient documentation

## 2021-09-13 DIAGNOSIS — D0512 Intraductal carcinoma in situ of left breast: Secondary | ICD-10-CM | POA: Insufficient documentation

## 2021-09-13 DIAGNOSIS — Z79811 Long term (current) use of aromatase inhibitors: Secondary | ICD-10-CM

## 2021-09-13 DIAGNOSIS — F17200 Nicotine dependence, unspecified, uncomplicated: Secondary | ICD-10-CM

## 2021-09-13 DIAGNOSIS — Z79899 Other long term (current) drug therapy: Secondary | ICD-10-CM | POA: Diagnosis not present

## 2021-09-13 DIAGNOSIS — D751 Secondary polycythemia: Secondary | ICD-10-CM

## 2021-09-13 LAB — COMPREHENSIVE METABOLIC PANEL
ALT: 14 U/L (ref 0–44)
AST: 16 U/L (ref 15–41)
Albumin: 4.4 g/dL (ref 3.5–5.0)
Alkaline Phosphatase: 50 U/L (ref 38–126)
Anion gap: 8 (ref 5–15)
BUN: 16 mg/dL (ref 6–20)
CO2: 28 mmol/L (ref 22–32)
Calcium: 9.6 mg/dL (ref 8.9–10.3)
Chloride: 98 mmol/L (ref 98–111)
Creatinine, Ser: 0.51 mg/dL (ref 0.44–1.00)
GFR, Estimated: >60 mL/min (ref >60)
Glucose, Bld: 96 mg/dL (ref 70–99)
Potassium: 4.2 mmol/L (ref 3.5–5.1)
Sodium: 134 mmol/L — ABNORMAL LOW (ref 135–145)
Total Bilirubin: 0.5 mg/dL (ref 0.3–1.2)
Total Protein: 7.4 g/dL (ref 6.5–8.1)

## 2021-09-13 LAB — CBC WITH DIFFERENTIAL/PLATELET
Abs Immature Granulocytes: 0.03 10*3/uL (ref 0.00–0.07)
Basophils Absolute: 0.1 10*3/uL (ref 0.0–0.1)
Basophils Relative: 1 %
Eosinophils Absolute: 0.1 10*3/uL (ref 0.0–0.5)
Eosinophils Relative: 1 %
HCT: 45.5 % (ref 36.0–46.0)
Hemoglobin: 15.7 g/dL — ABNORMAL HIGH (ref 12.0–15.0)
Immature Granulocytes: 0 %
Lymphocytes Relative: 28 %
Lymphs Abs: 2.3 10*3/uL (ref 0.7–4.0)
MCH: 32.4 pg (ref 26.0–34.0)
MCHC: 34.5 g/dL (ref 30.0–36.0)
MCV: 94 fL (ref 80.0–100.0)
Monocytes Absolute: 0.7 10*3/uL (ref 0.1–1.0)
Monocytes Relative: 9 %
Neutro Abs: 5 10*3/uL (ref 1.7–7.7)
Neutrophils Relative %: 61 %
Platelets: 311 10*3/uL (ref 150–400)
RBC: 4.84 MIL/uL (ref 3.87–5.11)
RDW: 12.1 % (ref 11.5–15.5)
WBC: 8.1 10*3/uL (ref 4.0–10.5)
nRBC: 0 % (ref 0.0–0.2)

## 2021-09-13 MED ORDER — SODIUM CHLORIDE 0.9 % IV SOLN
Freq: Once | INTRAVENOUS | Status: AC
  Filled 2021-09-13: qty 250

## 2021-09-13 MED ORDER — ZOLEDRONIC ACID 4 MG/100ML IV SOLN
4.0000 mg | Freq: Once | INTRAVENOUS | Status: AC
  Administered 2021-09-13: 4 mg via INTRAVENOUS
  Filled 2021-09-13: qty 100

## 2021-09-13 MED ORDER — LETROZOLE 2.5 MG PO TABS: 2.5000 mg | ORAL_TABLET | Freq: Every day | ORAL | 5 refills | Status: DC

## 2021-09-13 NOTE — Patient Instructions (Signed)

## 2021-09-13 NOTE — Progress Notes (Signed)
Patient here for follow up. Pt reports no new concerns. No new breast problems.  

## 2021-09-13 NOTE — Progress Notes (Signed)
Hematology/Oncology Progress note [phone] [phone]      Patient Care Team: Marguerita Merles, MD as PCP - General (Family Medicine)  REFERRING PROVIDER: Dr.Cooper. REASON FOR VISIT Follow up for treatment of :  DCIS  HISTORY OF PRESENTING ILLNESS:  Denise Reyes is a  56 y.o.  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   Patient had a mammogram done in May 2019.  Mammogram showed indeterminate calcifications in the upper outer quadrant of the left breast for which biopsy is indicated.  Patient is status post stereotactic guided core biopsy of the left breast calcifications. 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   Patient underwent left breast lumpectomy on 01/23/2018, pathology showed fibrocystic change with microcalcifications Patient was referred to oncology for further evaluation and management.   # Patient's case was discussed at breast tumor board on 02/11/2018. patient that although her DCIS foci is very small, her breast has very proliferative background, consistent with hormone replacement treatment.  Status post adjuvant RT completed in September 2019. Letrozole was started in September 2019   # Postmenopausal status: obtained patient's GYN operation report from Dr. Louann Liv office. Patient is in menopasue state as she had had bilateral salpingo-oophorectomy and hysterectomy on 12/30/2013. Pathology reviewed.  Showed benign essentially unremarkable cervix, benign proliferative endometrium associated with benign endometrial polyp.  Adenomyosis, leiomyomata, benign right ovary with small foci of endometriosis, benign right paratubal cyst essentially unremarkable left ovary and the fallopian tube.  Marland Kitchen #We obtained her records from GYN office and reviewed.  DEXA done in May 2019 showed left femoral neck T score -1.1, AP spine -0.3, right femoral neck -1.1  #September 2019, started on letrozole  INTERVAL HISTORY Denise Reyes is a 56 y.o. female who has above history reviewed by me today presents for management of DCIS. Patient takes letrozole.  Manageable side effects.  No new complaints or breast concerns.    Review of Systems  Constitutional:  Negative for chills, fever, malaise/fatigue and weight loss.  HENT:  Negative for nosebleeds and sore throat.   Eyes:  Negative for double vision, photophobia and redness.  Respiratory:  Negative for cough, shortness of breath and wheezing.   Cardiovascular:  Negative for chest pain, palpitations, orthopnea and leg swelling.  Gastrointestinal:  Negative for abdominal pain, blood in stool, nausea and vomiting.  Genitourinary:  Negative for dysuria.  Musculoskeletal:  Positive for joint pain. Negative for back pain, myalgias and neck pain.  Skin:  Negative for itching and rash.  Neurological:  Negative for dizziness, tingling and tremors.  Endo/Heme/Allergies:  Negative for environmental allergies. Does not bruise/bleed easily.       Hot flash  Psychiatric/Behavioral:  Negative for depression and hallucinations.    MEDICAL HISTORY:  Past Medical History:  Diagnosis Date   Allergy    PT CURRENTLY TAKING ABX AND PREDNISONE GIVEN BY DR Tami Ribas ON 01-16-18 FOR INFECTION THAT SHE GETS THIS TIME EVERY YEAR   Anxiety    Arthritis    knees, wrists   Asthma    allergy induced    Breast cancer ([place])    Cancer ([place])    GERD (gastroesophageal reflux disease)    occ-no meds   History of kidney stones    h/o   Personal history of radiation therapy 2019   LEFT lumpectomy  Problems with swallowing and mastication    Psoriasis    Recurrent upper respiratory infection (URI)    Special screening for malignant neoplasms, colon    Wears contact lenses     SURGICAL HISTORY: Past Surgical History:   Procedure Laterality Date   BILATERAL SALPINGOOPHORECTOMY Bilateral 12/30/2013   BREAST BIOPSY Left    DUCTAL CARCINOMA IN SITU (DCIS), LOW NUCLEAR GRADE.   BREAST BIOPSY Left 01/23/2018   Procedure: BREAST BIOPSY/PARTIAL MASTECTOMY WITH NEEDLE LOCALIZATION;  Surgeon: Florene Glen, MD;  Location: [facility];  Service: General;  Laterality: Left;   BREAST EXCISIONAL BIOPSY Left 01/23/2018   lumectomy with NL    BREAST LUMPECTOMY Left 12/2017   DUCTAL CARCINOMA IN SITU   COLONOSCOPY WITH PROPOFOL N/A 07/07/2016   Procedure: COLONOSCOPY WITH PROPOFOL;  Surgeon: Lucilla Lame, MD;  Location: [facility];  Service: Endoscopy;  Laterality: N/A;   ESOPHAGOGASTRODUODENOSCOPY (EGD) WITH PROPOFOL N/A 07/07/2016   Procedure: ESOPHAGOGASTRODUODENOSCOPY (EGD) WITH PROPOFOL;  Surgeon: Lucilla Lame, MD;  Location: [facility];  Service: Endoscopy;  Laterality: N/A;   LAPAROSCOPIC ASSISTED VAGINAL HYSTERECTOMY  12/30/2013   URETHRA SURGERY     14 months old    SOCIAL HISTORY: Social History   Socioeconomic History   Marital status: Single    Spouse name: Not on file   Number of children: Not on file   Years of education: Not on file   Highest education level: Not on file  Occupational History   Not on file  Tobacco Use   Smoking status: Every Day    Packs/day: 1.00    Years: 35.00    Pack years: 35.00    Types: Cigarettes   Smokeless tobacco: Never  Vaping Use   Vaping Use: Never used  Substance and Sexual Activity   Alcohol use: Yes    Alcohol/week: 2.0 standard drinks    Types: 2 Cans of beer per week    Comment: occ weekend beers   Drug use: No   Sexual activity: Not on file  Other Topics Concern   Not on file  Social History Narrative   Not on file   Social Determinants of Health   Financial Resource Strain: Not on file  Food Insecurity: Not on file  Transportation Needs: Not on file  Physical Activity: Not on file  Stress: Not on file  Social Connections:  Not on file  Intimate Partner Violence: Not on file    FAMILY HISTORY: Family History  Problem Relation Age of Onset   Hypertension Mother    High Cholesterol Mother    Allergic rhinitis Mother    Asthma Mother    Hypertension Father    Heart disease Father    COPD Father    High Cholesterol Father    Heart disease Paternal Grandfather    Cancer Sister        ovarian   Allergic rhinitis Sister    Cancer Maternal Aunt    Cancer Paternal Grandmother 88       breast   Breast cancer Paternal Grandmother    Angioedema Neg Hx    Atopy Neg Hx    Eczema Neg Hx    Immunodeficiency Neg Hx    Urticaria Neg Hx     ALLERGIES:  has No Known Allergies.  MEDICATIONS:  Current Outpatient Medications  Medication Sig Dispense Refill   albuterol (VENTOLIN HFA) 108 (90 Base) MCG/ACT inhaler Inhale 2 puffs into the lungs every 4 (four) hours as needed for wheezing  or shortness of breath (coughing, chest tightness). 18 g 1   atorvastatin (LIPITOR) 20 MG tablet Take 20 mg by mouth every evening.     Azelastine-Fluticasone 137-50 MCG/ACT SUSP Place 1 spray into the nose in the morning and at bedtime. For nasal symptoms. 23 g 5   calcium-vitamin D (OSCAL WITH D) 500-200 MG-UNIT tablet Take 1 tablet by mouth daily with breakfast.     cyanocobalamin 1000 MCG tablet Take 1,000 mcg by mouth daily.     Omega-3 Fatty Acids (OMEGA-3 FISH OIL PO) Take by mouth daily.     TRELEGY ELLIPTA 200-62.5-25 MCG/ACT AEPB INHALE 1 PUFF INTO THE LUNGS DAILY. RINSE MOUTH AFTER USE 60 each 5   vitamin C (ASCORBIC ACID) 500 MG tablet Take 500 mg by mouth daily.     letrozole (FEMARA) 2.5 MG tablet Take 1 tablet (2.5 mg total) by mouth daily. 30 tablet 5   No current facility-administered medications for this visit.     PHYSICAL EXAMINATION: ECOG PERFORMANCE STATUS: 0 - Asymptomatic Vitals:   09/13/21 1312  BP: 114/77  Pulse: (!) 106  Resp: 18  Temp: 98.4 F (36.9 C)   Filed Weights   09/13/21 1312   Weight: 132 lb (59.9 kg)    Physical Exam Constitutional:      General: She is not in acute distress. HENT:     Head: Normocephalic and atraumatic.  Eyes:     General: No scleral icterus.    Conjunctiva/sclera: Conjunctivae normal.     Pupils: Pupils are equal, round, and reactive to light.  Cardiovascular:     Rate and Rhythm: Normal rate and regular rhythm.     Heart sounds: Normal heart sounds.  Pulmonary:     Effort: Pulmonary effort is normal. No respiratory distress.     Breath sounds: Normal breath sounds. No wheezing or rales.  Chest:     Chest wall: No tenderness.  Abdominal:     General: Bowel sounds are normal. There is no distension.     Palpations: Abdomen is soft. There is no mass.     Tenderness: There is no abdominal tenderness.  Musculoskeletal:        General: No deformity. Normal range of motion.     Cervical back: Normal range of motion and neck supple.  Lymphadenopathy:     Cervical: No cervical adenopathy.  Skin:    General: Skin is warm and dry.     Findings: No erythema or rash.  Neurological:     Mental Status: She is alert and oriented to person, place, and time. Mental status is at baseline.     Cranial Nerves: No cranial nerve deficit.     Coordination: Coordination normal.  Psychiatric:        Mood and Affect: Mood normal.        Behavior: Behavior normal.        Thought Content: Thought content normal.   Breast exam was performed in seated and lying down position. Patient is status post left lumpectomy with a well-healed surgical scar. No evidence of any palpable masses. No palpable of axillary adenopathy. No palpable masses or lumps in the right breast.    LABORATORY DATA:  I have reviewed the data as listed Lab Results  Component Value Date   WBC 8.1 09/13/2021   HGB 15.7 (H) 09/13/2021   HCT 45.5 09/13/2021   MCV 94.0 09/13/2021   PLT 311 09/13/2021   Recent Labs    03/15/21 1248 09/13/21 1247  NA 138 134*  K 4.0 4.2  CL  104 98  CO2 27 28  GLUCOSE 131* 96  BUN 16 16  CREATININE 0.54 0.51  CALCIUM 9.1 9.6  GFRNONAA >60 >60  PROT 6.8 7.4  ALBUMIN 4.0 4.4  AST 16 16  ALT 14 14  ALKPHOS 49 50  BILITOT 0.7 0.5    Iron/TIBC/Ferritin/ %Sat No results found for: IRON, TIBC, FERRITIN, IRONPCTSAT   RADIOGRAPHIC STUDIES: I have personally reviewed the radiological images as listed and agreed with the findings in the report. No results found.   ASSESSMENT & PLAN:  1. Ductal carcinoma in situ (DCIS) of left breast   2. Tobacco use disorder   3. Aromatase inhibitor use   4. Erythrocytosis    # DCIS s/p lumpectomy and adjuvant RT.  Clinically patient is doing well.   Labs are reviewed and discussed with patient .  Continue letrozole.  Prescriptions were reviewed. Obtain bilateral mammogram in July 2023.  Patient prefers to have mammogram ordered through her primary care provider. 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 Continue Zometa every 6 months Continue calcium and vitamin D supplementation. Patient gets DEXA through gynecologist office, most recent DEXA was done in 2021-record is not available.  Patient will ask gynecologist to send records.  She plans to ask her gynecologist to obtain DEXA this year.  Zometa today   #Tobacco use, 35-pack-year smoking history smoke cessation was reviewed and discussed with patient.  Refer to lung cancer screening program.  #Erythrocytosis, this is new for her.  Likely secondary to smoking.  Monitor closely.  Orders Placed This Encounter  Procedures   Comprehensive metabolic panel    Standing Status:   Future    Standing Expiration Date:   09/13/2022   CBC with Differential/Platelet    Standing Status:   Future    Standing Expiration Date:   09/13/2022   Ambulatory Referral for Lung Cancer Screening    Referral Priority:   Routine    Referral Type:   Consultation    Referral Reason:    Specialty Services Required    Number of Visits Requested:   1     All questions were answered. The patient knows to call the clinic with any problems questions or concerns.  Return of visit: 6 months.  Earlie Server, MD, PhD Hematology Oncology  09/13/2021

## 2021-09-27 ENCOUNTER — Encounter: Payer: Self-pay | Admitting: Oncology

## 2021-10-03 ENCOUNTER — Other Ambulatory Visit: Payer: Self-pay | Admitting: *Deleted

## 2021-10-03 DIAGNOSIS — F17210 Nicotine dependence, cigarettes, uncomplicated: Secondary | ICD-10-CM

## 2021-10-03 DIAGNOSIS — Z87891 Personal history of nicotine dependence: Secondary | ICD-10-CM

## 2021-10-10 ENCOUNTER — Encounter: Payer: Self-pay | Admitting: Acute Care

## 2021-10-10 ENCOUNTER — Ambulatory Visit (INDEPENDENT_AMBULATORY_CARE_PROVIDER_SITE_OTHER): Payer: 59 | Admitting: Acute Care

## 2021-10-10 ENCOUNTER — Other Ambulatory Visit: Payer: Self-pay

## 2021-10-10 DIAGNOSIS — F17210 Nicotine dependence, cigarettes, uncomplicated: Secondary | ICD-10-CM

## 2021-10-10 NOTE — Patient Instructions (Signed)
Thank you for participating in the  Lung Cancer Screening Program. °It was our pleasure to meet you today. °We will call you with the results of your scan within the next few days. °Your scan will be assigned a Lung RADS category score by the physicians reading the scans.  °This Lung RADS score determines follow up scanning.  °See below for description of categories, and follow up screening recommendations. °We will be in touch to schedule your follow up screening annually or based on recommendations of our providers. °We will fax a copy of your scan results to your Primary Care Physician, or the physician who referred you to the program, to ensure they have the results. °Please call the office if you have any questions or concerns regarding your scanning experience or results.  °Our office number is [phone]. °Please speak with Denise Phelps, RN. She is our Lung Cancer Screening RN. °If she is unavailable when you call, please have the office staff send her a message. She will return your call at her earliest convenience. °Remember, if your scan is normal, we will scan you annually as long as you continue to meet the criteria for the program. (Age 55-77, Current smoker or smoker who has quit within the last 15 years). °If you are a smoker, remember, quitting is the single most powerful action that you can take to decrease your risk of lung cancer and other pulmonary, breathing related problems. °We know quitting is hard, and we are here to help.  °Please let us know if there is anything we can do to help you meet your goal of quitting. °If you are a former smoker, congratulations. We are proud of you! Remain smoke free! °Remember you can refer friends or family members through the number above.  °We will screen them to make sure they meet criteria for the program. °Thank you for helping us take better care of you by participating in Lung Screening. ° °You can receive free nicotine replacement therapy  ( patches, gum or mints) by calling 1-800-QUIT NOW. Please call so we can get you on the path to becoming  a non-smoker. I know it is hard, but you can do this! ° °Lung RADS Categories: ° °Lung RADS 1: no nodules or definitely non-concerning nodules.  °Recommendation is for a repeat annual scan in 12 months. ° °Lung RADS 2:  nodules that are non-concerning in appearance and behavior with a very low likelihood of becoming an active cancer. °Recommendation is for a repeat annual scan in 12 months. ° °Lung RADS 3: nodules that are probably non-concerning , includes nodules with a low likelihood of becoming an active cancer.  Recommendation is for a 6-month repeat screening scan. Often noted after an upper respiratory illness. We will be in touch to make sure you have no questions, and to schedule your 6-month scan. ° °Lung RADS 4 A: nodules with concerning findings, recommendation is most often for a follow up scan in 3 months or additional testing based on our provider's assessment of the scan. We will be in touch to make sure you have no questions and to schedule the recommended 3 month follow up scan. ° °Lung RADS 4 B:  indicates findings that are concerning. We will be in touch with you to schedule additional diagnostic testing based on our provider's  assessment of the scan. ° °Hypnosis for smoking cessation  °[employer] °[phone] ° °Acupuncture for smoking cessation  °[facility] °[phone]  °

## 2021-10-10 NOTE — Progress Notes (Signed)
Virtual Visit via Telephone Note ? ?I connected with Denise Reyes on 06/14/21 at  2:00 PM EST by telephone and verified that I am speaking with the correct person using two identifiers. ? ?Location: ?Patient: [place] ?Provider: Working from home ?  ?I discussed the limitations, risks, security and privacy concerns of performing an evaluation and management service by telephone and the availability of in person appointments. I also discussed with the patient that there may be a patient responsible charge related to this service. The patient expressed understanding and agreed to proceed. ? ?Shared Decision Making Visit Lung Cancer Screening Program ?([phone]) ? ? ?Eligibility: ?Age 56 y.o. ?Pack Years Smoking History Calculation 50 ?(# packs/per year x # years smoked) ?Recent History of coughing up blood  no ?Unexplained weight loss? no ?( >Than 15 pounds within the last 6 months ) ?Prior History Lung / other cancer no ?(Diagnosis within the last 5 years already requiring surveillance chest CT Scans). ?Smoking Status Current Smoker ?Former Smokers: Years since quit: NA ? Quit Date: NA ? ?Visit Components: ?Discussion included one or more decision making aids. yes ?Discussion included risk/benefits of screening. yes ?Discussion included potential follow up diagnostic testing for abnormal scans. yes ?Discussion included meaning and risk of over diagnosis. yes ?Discussion included meaning and risk of False Positives. yes ?Discussion included meaning of total radiation exposure. yes ? ?Counseling Included: ?Importance of adherence to annual lung cancer LDCT screening. yes ?Impact of comorbidities on ability to participate in the program. yes ?Ability and willingness to under diagnostic treatment. yes ? ?Smoking Cessation Counseling: ?Current Smokers:  ?Discussed importance of smoking cessation. yes ?Information about tobacco cessation classes and interventions provided to patient. yes ?Patient provided with "ticket" for LDCT  Scan. yes ?Symptomatic Patient. yes ? Counseling(Intermediate counseling: > three minutes) [id number] ?Diagnosis Code: Tobacco Use Z72.0 ?Asymptomatic Patient no ? Counseling NA ?Former Smokers:  ?Discussed the importance of maintaining cigarette abstinence. yes ?Diagnosis Code: Personal History of Nicotine Dependence. J62.831 ?Information about tobacco cessation classes and interventions provided to patient. Yes ?Patient provided with "ticket" for LDCT Scan. yes ?Written Order for Lung Cancer Screening with LDCT placed in Epic. Yes ?(CT Chest Lung Cancer Screening Low Dose W/O CM) [id number] ?Z12.2-Screening of respiratory organs ?Z87.891-Personal history of nicotine dependence ? ? ?I spent 25 minutes of face to face time with her discussing the risks and benefits of lung cancer screening. We viewed a power point together that explained in detail the above noted topics. We took the time to pause the power point at intervals to allow for questions to be asked and answered to ensure understanding. We discussed that she had taken the single most powerful action possible to decrease her risk of developing lung cancer when he quit smoking. I counseled her to remain smoke free, and to contact me if she ever had the desire to smoke again so that I can provide resources and tools to help support the effort to remain smoke free. We discussed the time and location of the scan, and that either  Doroteo Glassman RN or I will call with the results within  24-48 hours of receiving them. She has my card and contact information in the event she needs to speak with me, in addition to a copy of the power point we reviewed as a resource. She verbalized understanding of all of the above and had no further questions upon leaving the office.  ? ? ? ?I explained to the patient that there has been a  high incidence of coronary artery disease noted on these exams. I explained that this is a non-gated exam therefore degree or severity cannot be  determined. This patient is on statin therapy. I have asked the patient to follow-up with their PCP regarding any incidental finding of coronary artery disease and management with diet or medication as they feel is clinically indicated. The patient verbalized understanding of the above and had no further questions. ? ? ?I spent 3 minutes counseling on smoking cessation and the health risks of continued tobacco abuse  ? ? ?Selim Durden D. Harris, NP-C ?[facility] ?Personal contact information can be found on Amion  ?10/10/2021, 10:37 AM ? ? ? ? ? ? ? ? ? ?

## 2021-10-11 ENCOUNTER — Encounter: Payer: Self-pay | Admitting: Oncology

## 2021-10-11 ENCOUNTER — Ambulatory Visit
Admission: RE | Admit: 2021-10-11 | Discharge: 2021-10-11 | Disposition: A | Discharge status: Home / Self Care | Payer: 59 | Source: Ambulatory Visit | Attending: Acute Care | Admitting: Acute Care

## 2021-10-11 DIAGNOSIS — Z87891 Personal history of nicotine dependence: Secondary | ICD-10-CM

## 2021-10-11 DIAGNOSIS — F17210 Nicotine dependence, cigarettes, uncomplicated: Secondary | ICD-10-CM

## 2021-10-13 ENCOUNTER — Other Ambulatory Visit: Payer: Self-pay

## 2021-12-19 ENCOUNTER — Encounter: Payer: Self-pay | Admitting: Allergy

## 2021-12-21 ENCOUNTER — Ambulatory Visit: Payer: 59 | Admitting: Allergy

## 2022-01-17 NOTE — Patient Instructions (Incomplete)
Asthma COPD overlap Continue Trelegy 200-1 puff once a day to prevent cough or wheeze Continue albuterol 2 puffs once every 4 hours as needed for cough or wheeze You may use albuterol 2 puffs 5 to 15 minutes before activity to decrease cough or wheeze  Allergic rhinitis Continue allergen avoidance measures directed toward grass pollen and dog as listed below Continue an over-the-counter antihistamine once a day as needed for runny nose or itch Continue Dymista 2 sprays in each nostril twice a day as needed for nasal symptoms.aasal  Reflux Continue dietary and lifestyle modifications as listed below Continue omeprazole once a day for reflux control  Tobacco use

## 2022-01-18 ENCOUNTER — Other Ambulatory Visit: Payer: Self-pay

## 2022-01-18 ENCOUNTER — Ambulatory Visit (INDEPENDENT_AMBULATORY_CARE_PROVIDER_SITE_OTHER): Payer: 59 | Admitting: Family Medicine

## 2022-01-18 ENCOUNTER — Encounter: Payer: Self-pay | Admitting: Family Medicine

## 2022-01-18 VITALS — BP 122/78 | HR 102 | Temp 98.3°F | Resp 18 | Ht 59.0 in | Wt 133.8 lb

## 2022-01-18 DIAGNOSIS — J449 Chronic obstructive pulmonary disease, unspecified: Secondary | ICD-10-CM

## 2022-01-18 DIAGNOSIS — J302 Other seasonal allergic rhinitis: Secondary | ICD-10-CM

## 2022-01-18 DIAGNOSIS — H1013 Acute atopic conjunctivitis, bilateral: Secondary | ICD-10-CM

## 2022-01-18 DIAGNOSIS — K219 Gastro-esophageal reflux disease without esophagitis: Secondary | ICD-10-CM

## 2022-01-18 DIAGNOSIS — Z720 Tobacco use: Secondary | ICD-10-CM

## 2022-01-18 DIAGNOSIS — H1010 Acute atopic conjunctivitis, unspecified eye: Secondary | ICD-10-CM

## 2022-01-18 MED ORDER — OMEPRAZOLE 20 MG PO CPDR: 20.0000 mg | DELAYED_RELEASE_CAPSULE | Freq: Every day | ORAL | 5 refills | Status: DC

## 2022-01-18 MED ORDER — TRELEGY ELLIPTA 200-62.5-25 MCG/ACT IN AEPB: INHALATION_SPRAY | RESPIRATORY_TRACT | 5 refills | Status: DC

## 2022-01-18 NOTE — Progress Notes (Cosign Needed)
[address] [place] [address] Dept: [phone]  FOLLOW UP NOTE  Patient ID: Denise Reyes, female    DOB: 1965-10-11  Age: 56 y.o. MRN: [id number] Date of Office Visit: 01/18/2022  Assessment  Chief Complaint: Asthma (Some cough due to the weather )  HPI Denise Reyes is a 56 year old female who presents to the clinic for a follow up visit. She was last seen in this clinic on 08/17/2021 by Dr. Maudie Mercury for evaluation of ACO, allergic rhinitis, and reflux. At today's visit, she reports that she has been using harsh cleaning agents while cleaning at her brother's house over the last week. She reports her asthma has been moderately well controlled with symptoms including shortness of breath with activity and cough producing thick mucus which has been worse this week with the rain. She continues Trelegy 200-1 puff once a day, uses albuterol before activity, and uses albuterol about once a week for rescue with relief of symptoms. Allergic rhinitis is reported as moderately well controlled with symptoms including clear rhinorrhea, nasal congestion, and sneezing. She is currently taking either Zyrtec or Zyrtec-D before bed nightly, Dymista, and occasional nasal saline rinses. Reflux is reported as poorly controlled with heartburn occurring daily for which she has previously taken omeprazole with relief of symptoms. She is not currently taking a medication to control reflux. Her current medications are listed in the chart.   Drug Allergies:  No Known Allergies  Physical Exam: BP 122/78   Pulse (!) 102   Temp 98.3 F (36.8 C)   Resp 18   Ht '4\' 11"'$  (1.499 m)   Wt 133 lb 12.8 oz (60.7 kg)   SpO2 98%   BMI 27.02 kg/m    Physical Exam Vitals reviewed.  Constitutional:      Appearance: Normal appearance.  HENT:     Head: Normocephalic and atraumatic.     Right Ear: Tympanic membrane normal.     Left Ear: Tympanic membrane normal.     Nose:     Comments: Bilateral nares  slightly erythematous with clear nasal drainage noted. Pharynx normal. Ears normal. Eyes normal.    Mouth/Throat:     Pharynx: Oropharynx is clear.  Eyes:     Conjunctiva/sclera: Conjunctivae normal.  Cardiovascular:     Rate and Rhythm: Normal rate and regular rhythm.     Heart sounds: Normal heart sounds. No murmur heard. Pulmonary:     Effort: Pulmonary effort is normal.     Breath sounds: Normal breath sounds.     Comments: Scattered rhonchi that clears with coughing Musculoskeletal:        General: Normal range of motion.     Cervical back: Normal range of motion and neck supple.  Skin:    General: Skin is warm and dry.  Neurological:     Mental Status: She is alert and oriented to person, place, and time.  Psychiatric:        Mood and Affect: Mood normal.        Behavior: Behavior normal.        Thought Content: Thought content normal.        Judgment: Judgment normal.     Diagnostics: FVC 2.26.  FEV1 1.70.  Predicted FVC 2.74, predicted FEV1 2.20.  Spirometry indicates normal ventilatory function.  Assessment and Plan: 1. 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 Seasonal and perennial allergic rhinoconjunctivitis   3. Tobacco user   4. Gastroesophageal  reflux disease, unspecified whether esophagitis present     Meds ordered this encounter  Medications   omeprazole (PRILOSEC) 20 MG capsule    Sig: Take 1 capsule (20 mg total) by mouth daily.    Dispense:  30 capsule    Refill:  5   TRELEGY ELLIPTA 200-62.5-25 MCG/ACT AEPB    Sig: INHALE 1 PUFF INTO THE LUNGS DAILY. RINSE MOUTH AFTER USE    Dispense:  60 each    Refill:  5    Patient Instructions  Asthma COPD overlap Continue Trelegy 200-1 puff once a day to prevent cough or wheeze Continue albuterol 2 puffs once every 4 hours as needed for cough or wheeze You may use albuterol 2 puffs 5 to 15 minutes before activity to decrease cough or wheeze  Allergic rhinitis Continue  allergen avoidance measures directed toward grass pollen and dog as listed below Continue an over-the-counter antihistamine once a day as needed for runny nose or itch Continue Dymista 2 sprays in each nostril twice a day as needed for nasal symptoms.aasal  Reflux Continue dietary and lifestyle modifications as listed below Begin omeprazole 20 mg once a day for reflux control  Tobacco use Continue to cut down on smoking and best to quit  Call the clinic if this treatment plan is not working well for you  Follow up in 6 months or sooner if needed.   Return in about 6 months (around 07/20/2022), or if symptoms worsen or fail to improve.    Thank you for the opportunity to care for this patient.  Please do not hesitate to contact me with questions.  Gareth Morgan, FNP [facility] [place] [place]

## 2022-02-20 ENCOUNTER — Ambulatory Visit: Payer: 59 | Admitting: Family

## 2022-03-14 ENCOUNTER — Inpatient Hospital Stay: Payer: 59

## 2022-03-14 ENCOUNTER — Telehealth: Payer: Self-pay

## 2022-03-14 ENCOUNTER — Encounter: Payer: Self-pay | Admitting: Obstetrics & Gynecology

## 2022-03-14 ENCOUNTER — Inpatient Hospital Stay: Payer: 59 | Attending: Oncology

## 2022-03-14 ENCOUNTER — Encounter: Payer: Self-pay | Admitting: Oncology

## 2022-03-14 ENCOUNTER — Other Ambulatory Visit: Payer: Self-pay

## 2022-03-14 ENCOUNTER — Inpatient Hospital Stay (HOSPITAL_BASED_OUTPATIENT_CLINIC_OR_DEPARTMENT_OTHER): Payer: 59 | Admitting: Oncology

## 2022-03-14 VITALS — BP 132/94 | HR 97 | Temp 98.5°F | Ht 59.0 in | Wt 138.0 lb

## 2022-03-14 DIAGNOSIS — Z79899 Other long term (current) drug therapy: Secondary | ICD-10-CM | POA: Insufficient documentation

## 2022-03-14 DIAGNOSIS — D0512 Intraductal carcinoma in situ of left breast: Secondary | ICD-10-CM

## 2022-03-14 DIAGNOSIS — F17200 Nicotine dependence, unspecified, uncomplicated: Secondary | ICD-10-CM | POA: Diagnosis not present

## 2022-03-14 DIAGNOSIS — M8580 Other specified disorders of bone density and structure, unspecified site: Secondary | ICD-10-CM

## 2022-03-14 DIAGNOSIS — Z79811 Long term (current) use of aromatase inhibitors: Secondary | ICD-10-CM

## 2022-03-14 LAB — CBC WITH DIFFERENTIAL/PLATELET
Abs Immature Granulocytes: 0.03 10*3/uL (ref 0.00–0.07)
Basophils Absolute: 0.1 10*3/uL (ref 0.0–0.1)
Basophils Relative: 1 %
Eosinophils Absolute: 0.1 10*3/uL (ref 0.0–0.5)
Eosinophils Relative: 1 %
HCT: 40 % (ref 36.0–46.0)
Hemoglobin: 13.9 g/dL (ref 12.0–15.0)
Immature Granulocytes: 0 %
Lymphocytes Relative: 28 %
Lymphs Abs: 2.2 10*3/uL (ref 0.7–4.0)
MCH: 32.3 pg (ref 26.0–34.0)
MCHC: 34.8 g/dL (ref 30.0–36.0)
MCV: 93 fL (ref 80.0–100.0)
Monocytes Absolute: 0.6 10*3/uL (ref 0.1–1.0)
Monocytes Relative: 8 %
Neutro Abs: 4.7 10*3/uL (ref 1.7–7.7)
Neutrophils Relative %: 62 %
Platelets: 266 10*3/uL (ref 150–400)
RBC: 4.3 MIL/uL (ref 3.87–5.11)
RDW: 12 % (ref 11.5–15.5)
WBC: 7.6 10*3/uL (ref 4.0–10.5)
nRBC: 0 % (ref 0.0–0.2)

## 2022-03-14 LAB — COMPREHENSIVE METABOLIC PANEL
ALT: 12 U/L (ref 0–44)
AST: 13 U/L — ABNORMAL LOW (ref 15–41)
Albumin: 3.8 g/dL (ref 3.5–5.0)
Alkaline Phosphatase: 46 U/L (ref 38–126)
Anion gap: 10 (ref 5–15)
BUN: 18 mg/dL (ref 6–20)
CO2: 21 mmol/L — ABNORMAL LOW (ref 22–32)
Calcium: 8.9 mg/dL (ref 8.9–10.3)
Chloride: 105 mmol/L (ref 98–111)
Creatinine, Ser: 0.45 mg/dL (ref 0.44–1.00)
GFR, Estimated: >60 mL/min (ref >60)
Glucose, Bld: 89 mg/dL (ref 70–99)
Potassium: 3.7 mmol/L (ref 3.5–5.1)
Sodium: 136 mmol/L (ref 135–145)
Total Bilirubin: 0.8 mg/dL (ref 0.3–1.2)
Total Protein: 6.7 g/dL (ref 6.5–8.1)

## 2022-03-14 MED ORDER — SODIUM CHLORIDE 0.9 % IV SOLN
INTRAVENOUS | Status: DC
  Filled 2022-03-14: qty 250

## 2022-03-14 MED ORDER — LETROZOLE 2.5 MG PO TABS: 2.5000 mg | ORAL_TABLET | Freq: Every day | ORAL | 5 refills | Status: DC

## 2022-03-14 MED ORDER — ZOLEDRONIC ACID 4 MG/100ML IV SOLN
4.0000 mg | Freq: Once | INTRAVENOUS | Status: AC
  Administered 2022-03-14: 4 mg via INTRAVENOUS

## 2022-03-14 NOTE — Telephone Encounter (Signed)
Patient requesting for [place] to schedule Dexa due to [facility] not being able to have Dexa done until November. Lamar Sprinkles, please schedule patient for DEXA next available.

## 2022-03-14 NOTE — Telephone Encounter (Signed)
error 

## 2022-03-14 NOTE — Telephone Encounter (Signed)
Called and spoke with Junie Panning at [facility] in [place] to obtain patient's last DEXA scan. Per Junie Panning last Dexa scan was done in 01/2020. Per Dr. Tasia Catchings would like patient to get an updated DEXA. Called patient to inform Dr. Collie Siad request, no answer, left detailed message advising patient. Advised to call back with any questions or concerns.

## 2022-03-14 NOTE — Patient Instructions (Signed)

## 2022-03-14 NOTE — Progress Notes (Signed)
Hematology/Oncology Progress note [phone] [phone]      Patient Care Team: Marguerita Merles, MD as PCP - General (Family Medicine)  REFERRING PROVIDER: Dr.Cooper. REASON FOR VISIT Follow up for treatment of :  DCIS  HISTORY OF PRESENTING ILLNESS:  Denise Reyes is a  56 y.o.  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   Patient had a mammogram done in May 2019.  Mammogram showed indeterminate calcifications in the upper outer quadrant of the left breast for which biopsy is indicated.  Patient is status post stereotactic guided core biopsy of the left breast calcifications. 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   Patient underwent left breast lumpectomy on 01/23/2018, pathology showed fibrocystic change with microcalcifications Patient was referred to oncology for further evaluation and management.   # Patient's case was discussed at breast tumor board on 02/11/2018. patient that although her DCIS foci is very small, her breast has very proliferative background, consistent with hormone replacement treatment.  Status post adjuvant RT completed in September 2019. Letrozole was started in September 2019   # Postmenopausal status: obtained patient's GYN operation report from Dr. Louann Liv office. Patient is in menopasue state as she had had bilateral salpingo-oophorectomy and hysterectomy on 12/30/2013. Pathology reviewed.  Showed benign essentially unremarkable cervix, benign proliferative endometrium associated with benign endometrial polyp.  Adenomyosis, leiomyomata, benign right ovary with small foci of endometriosis, benign right paratubal cyst essentially unremarkable left ovary and the fallopian tube.  Marland Kitchen #We obtained her records from GYN office and reviewed.  DEXA done in May 2019 showed left femoral neck T score -1.1, AP spine -0.3, right femoral neck -1.1  #September 2019, started on letrozole  INTERVAL HISTORY Denise Reyes is a 56 y.o. female who has above history reviewed by me today presents for management of DCIS. Patient takes letrozole.  Patient has manageable side effects.  She denies any new breast concerns or any other new complaints.    Review of Systems  Constitutional:  Negative for chills, fever, malaise/fatigue and weight loss.  HENT:  Negative for nosebleeds and sore throat.   Eyes:  Negative for double vision, photophobia and redness.  Respiratory:  Negative for cough, shortness of breath and wheezing.   Cardiovascular:  Negative for chest pain, palpitations, orthopnea and leg swelling.  Gastrointestinal:  Negative for abdominal pain, blood in stool, nausea and vomiting.  Genitourinary:  Negative for dysuria.  Musculoskeletal:  Positive for joint pain. Negative for back pain, myalgias and neck pain.  Skin:  Negative for itching and rash.  Neurological:  Negative for dizziness, tingling and tremors.  Endo/Heme/Allergies:  Negative for environmental allergies. Does not bruise/bleed easily.       Hot flash  Psychiatric/Behavioral:  Negative for depression and hallucinations.     MEDICAL HISTORY:  Past Medical History:  Diagnosis Date   Allergy    PT CURRENTLY TAKING ABX AND PREDNISONE GIVEN BY DR Tami Ribas ON 01-16-18 FOR INFECTION THAT SHE GETS THIS TIME EVERY YEAR   Anxiety    Arthritis    knees, wrists   Asthma    allergy induced    Breast cancer ([place])    Cancer ([place])    GERD (gastroesophageal reflux disease)    occ-no meds   History of kidney stones    h/o   Personal history of  radiation therapy 2019   LEFT lumpectomy   Problems with swallowing and mastication    Psoriasis    Recurrent upper respiratory infection (URI)    Special screening for malignant neoplasms, colon    Wears contact lenses      SURGICAL HISTORY: Past Surgical History:  Procedure Laterality Date   BILATERAL SALPINGOOPHORECTOMY Bilateral 12/30/2013   BREAST BIOPSY Left    DUCTAL CARCINOMA IN SITU (DCIS), LOW NUCLEAR GRADE.   BREAST BIOPSY Left 01/23/2018   Procedure: BREAST BIOPSY/PARTIAL MASTECTOMY WITH NEEDLE LOCALIZATION;  Surgeon: Florene Glen, MD;  Location: [facility];  Service: General;  Laterality: Left;   BREAST EXCISIONAL BIOPSY Left 01/23/2018   lumectomy with NL    BREAST LUMPECTOMY Left 12/2017   DUCTAL CARCINOMA IN SITU   COLONOSCOPY WITH PROPOFOL N/A 07/07/2016   Procedure: COLONOSCOPY WITH PROPOFOL;  Surgeon: Lucilla Lame, MD;  Location: [place];  Service: Endoscopy;  Laterality: N/A;   ESOPHAGOGASTRODUODENOSCOPY (EGD) WITH PROPOFOL N/A 07/07/2016   Procedure: ESOPHAGOGASTRODUODENOSCOPY (EGD) WITH PROPOFOL;  Surgeon: Lucilla Lame, MD;  Location: [place];  Service: Endoscopy;  Laterality: N/A;   LAPAROSCOPIC ASSISTED VAGINAL HYSTERECTOMY  12/30/2013   URETHRA SURGERY     14 months old    SOCIAL HISTORY: Social History   Socioeconomic History   Marital status: Single    Spouse name: Not on file   Number of children: Not on file   Years of education: Not on file   Highest education level: Not on file  Occupational History   Not on file  Tobacco Use   Smoking status: Every Day    Packs/day: 1.25    Years: 41.00    Total pack years: 51.25    Types: Cigarettes   Smokeless tobacco: Never  Vaping Use   Vaping Use: Never used  Substance and Sexual Activity   Alcohol use: Yes    Alcohol/week: 2.0 standard drinks of alcohol    Types: 2 Cans of beer per week    Comment: occ weekend beers   Drug use: No   Sexual activity: Not on file  Other Topics Concern   Not on file  Social History Narrative   Not on file   Social Determinants of Health   Financial Resource Strain: Not on file  Food Insecurity: Not on file  Transportation Needs: Not on file  Physical  Activity: Not on file  Stress: Not on file  Social Connections: Not on file  Intimate Partner Violence: Not on file    FAMILY HISTORY: Family History  Problem Relation Age of Onset   Hypertension Mother    High Cholesterol Mother    Allergic rhinitis Mother    Asthma Mother    Hypertension Father    Heart disease Father    COPD Father    High Cholesterol Father    Heart disease Paternal Grandfather    Cancer Sister        ovarian   Allergic rhinitis Sister    Cancer Maternal Aunt    Cancer Paternal Grandmother 4       breast   Breast cancer Paternal Grandmother    Angioedema Neg Hx    Atopy Neg Hx    Eczema Neg Hx    Immunodeficiency Neg Hx    Urticaria Neg Hx     ALLERGIES:  has No Known Allergies.  MEDICATIONS:  Current Outpatient Medications  Medication Sig Dispense Refill   albuterol (VENTOLIN HFA) 108 (90 Base) MCG/ACT inhaler Inhale 2  puffs into the lungs every 4 (four) hours as needed for wheezing or shortness of breath (coughing, chest tightness). 18 g 1   atorvastatin (LIPITOR) 20 MG tablet Take 20 mg by mouth every evening.     Azelastine-Fluticasone 137-50 MCG/ACT SUSP Place 1 spray into the nose in the morning and at bedtime. For nasal symptoms. 23 g 5   calcium-vitamin D (OSCAL WITH D) 500-200 MG-UNIT tablet Take 1 tablet by mouth daily with breakfast.     cyanocobalamin 1000 MCG tablet Take 1,000 mcg by mouth daily.     Omega-3 Fatty Acids (OMEGA-3 FISH OIL PO) Take by mouth daily.     omeprazole (PRILOSEC) 20 MG capsule Take 1 capsule (20 mg total) by mouth daily. 30 capsule 5   TRELEGY ELLIPTA 200-62.5-25 MCG/ACT AEPB INHALE 1 PUFF INTO THE LUNGS DAILY. RINSE MOUTH AFTER USE 60 each 5   vitamin C (ASCORBIC ACID) 500 MG tablet Take 500 mg by mouth daily.     letrozole (FEMARA) 2.5 MG tablet Take 1 tablet (2.5 mg total) by mouth daily. 30 tablet 5   No current facility-administered medications for this visit.     PHYSICAL EXAMINATION: ECOG  PERFORMANCE STATUS: 0 - Asymptomatic Vitals:   03/14/22 1324  BP: (!) 132/94  Pulse: 97  Temp: 98.5 F (36.9 C)   Filed Weights   03/14/22 1324  Weight: 138 lb (62.6 kg)    Physical Exam Constitutional:      General: She is not in acute distress. HENT:     Head: Normocephalic and atraumatic.  Eyes:     General: No scleral icterus.    Conjunctiva/sclera: Conjunctivae normal.     Pupils: Pupils are equal, round, and reactive to light.  Cardiovascular:     Rate and Rhythm: Normal rate and regular rhythm.     Heart sounds: Normal heart sounds.  Pulmonary:     Effort: Pulmonary effort is normal. No respiratory distress.     Breath sounds: Normal breath sounds. No wheezing or rales.  Chest:     Chest wall: No tenderness.  Abdominal:     General: Bowel sounds are normal. There is no distension.     Palpations: Abdomen is soft. There is no mass.     Tenderness: There is no abdominal tenderness.  Musculoskeletal:        General: No deformity. Normal range of motion.     Cervical back: Normal range of motion and neck supple.  Lymphadenopathy:     Cervical: No cervical adenopathy.  Skin:    General: Skin is warm and dry.     Findings: No erythema or rash.  Neurological:     Mental Status: She is alert and oriented to person, place, and time. Mental status is at baseline.     Cranial Nerves: No cranial nerve deficit.     Coordination: Coordination normal.  Psychiatric:        Mood and Affect: Mood normal.        Behavior: Behavior normal.        Thought Content: Thought content normal.       LABORATORY DATA:  I have reviewed the data as listed Lab Results  Component Value Date   WBC 7.6 03/14/2022   HGB 13.9 03/14/2022   HCT 40.0 03/14/2022   MCV 93.0 03/14/2022   PLT 266 03/14/2022   Recent Labs    03/15/21 1248 09/13/21 1247 03/14/22 1305  NA 138 134* 136  K 4.0 4.2 3.7  CL 104 98 105  CO2 27 28 21*  GLUCOSE 131* 96 89  BUN '16 16 18  '$ CREATININE 0.54  0.51 0.45  CALCIUM 9.1 9.6 8.9  GFRNONAA >60 >60 >60  PROT 6.8 7.4 6.7  ALBUMIN 4.0 4.4 3.8  AST 16 16 13*  ALT '14 14 12  '$ ALKPHOS 49 50 46  BILITOT 0.7 0.5 0.8    Iron/TIBC/Ferritin/ %Sat No results found for: "IRON", "TIBC", "FERRITIN", "IRONPCTSAT"   RADIOGRAPHIC STUDIES: I have personally reviewed the radiological images as listed and agreed with the findings in the report. No results found.   ASSESSMENT & PLAN:  1. Ductal carcinoma in situ (DCIS) of left breast   2. Osteopenia, unspecified location   3. Aromatase inhibitor use   4. Tobacco use disorder    # DCIS s/p lumpectomy and adjuvant RT.  Currently on adjuvant endocrine therapy with letrozole.  She tolerates well with manageable side effects. Recommend patient to continue letrozole 2.5 mg daily.  Prescription sent to pharmacy. Duration of treatment total 5 years -September 2024. Patient has bilateral mammogram ordered through her gynecologist   #Osteopenia in the context of chronic aromatase inhibitor use. Continue Zometa every 6 months Continue calcium and vitamin D supplementation. Patient gets DEXA through gynecologist office, most recent DEXA was done in 2021-results showed osteopenia.  I recommend DEXA every 2 years..  Patient prefers her gynecologist to obtain DEXA this year.  Proceed with Zometa today   #Tobacco use, 35-pack-year smoking history smoke cessation was reviewed and discussed with patient.   Patient has establish care with lung cancer screening program and has been up-to-date for screening.  #Erythrocytosis, resolved.  Orders Placed This Encounter  Procedures   CBC with Differential/Platelet    Standing Status:   Future    Standing Expiration Date:   03/15/2023   Comprehensive metabolic panel    Standing Status:   Future    Standing Expiration Date:   03/14/2023     All questions were answered. The patient knows to call the clinic with any problems questions or concerns.  Return of  visit: 6 months.  Earlie Server, MD, PhD Hematology Oncology  03/14/2022

## 2022-03-22 ENCOUNTER — Encounter: Payer: Self-pay | Admitting: Oncology

## 2022-04-04 ENCOUNTER — Other Ambulatory Visit: Payer: Self-pay | Admitting: Obstetrics & Gynecology

## 2022-04-04 DIAGNOSIS — R928 Other abnormal and inconclusive findings on diagnostic imaging of breast: Secondary | ICD-10-CM

## 2022-04-11 ENCOUNTER — Ambulatory Visit
Admission: RE | Admit: 2022-04-11 | Discharge: 2022-04-11 | Disposition: A | Discharge status: Home / Self Care | Payer: 59 | Source: Ambulatory Visit | Attending: Obstetrics & Gynecology | Admitting: Obstetrics & Gynecology

## 2022-04-11 ENCOUNTER — Ambulatory Visit: Payer: 59

## 2022-04-11 DIAGNOSIS — R928 Other abnormal and inconclusive findings on diagnostic imaging of breast: Secondary | ICD-10-CM

## 2022-04-17 ENCOUNTER — Ambulatory Visit
Admission: RE | Admit: 2022-04-17 | Discharge: 2022-04-17 | Disposition: A | Discharge status: Home / Self Care | Payer: 59 | Source: Ambulatory Visit | Attending: Radiation Oncology | Admitting: Radiation Oncology

## 2022-04-17 VITALS — BP 116/82 | HR 99 | Temp 98.6°F | Resp 17 | Wt 140.7 lb

## 2022-04-17 DIAGNOSIS — Z170 Estrogen receptor positive status [ER+]: Secondary | ICD-10-CM | POA: Insufficient documentation

## 2022-04-17 DIAGNOSIS — D0511 Intraductal carcinoma in situ of right breast: Secondary | ICD-10-CM | POA: Diagnosis present

## 2022-04-17 DIAGNOSIS — D0512 Intraductal carcinoma in situ of left breast: Secondary | ICD-10-CM

## 2022-04-17 DIAGNOSIS — Z923 Personal history of irradiation: Secondary | ICD-10-CM | POA: Diagnosis not present

## 2022-04-17 NOTE — Progress Notes (Signed)
Radiation Oncology Follow up Note  Name: Denise Reyes   Date:   04/17/2022 MRN:  [id number] DOB: [birth year]    This 56 y.o. female presents to the clinic today for 4-year follow-up status post whole breast radiation to her left breast for ER/PR positive ductal carcinoma in situ.  REFERRING PROVIDER: Marguerita Merles, MD  HPI: Patient is a 56 year old female now out for years having completed whole breast radiation to her left breast for ER positive ductal carcinoma in situ.  Seen today in routine follow-up she is doing well.  Specifically denies breast tenderness cough or bone pain..  She is currently on letrozole tolerating it well without side effect.  She had mammograms last week which I have reviewed were BI-RADS 2 benign.  COMPLICATIONS OF TREATMENT: none  FOLLOW UP COMPLIANCE: keeps appointments   PHYSICAL EXAM:  BP 116/82 (Patient Position: Sitting)   Pulse 99   Temp 98.6 F (37 C) (Oral)   Resp 17   Wt 140 lb 11.2 oz (63.8 kg)   SpO2 100%   BMI 28.42 kg/m  Lungs are clear to A&P cardiac examination essentially unremarkable with regular rate and rhythm. No dominant mass or nodularity is noted in either breast in 2 positions examined. Incision is well-healed. No axillary or supraclavicular adenopathy is appreciated. Cosmetic result is excellent.  Well-developed well-nourished patient in NAD. HEENT reveals PERLA, EOMI, discs not visualized.  Oral cavity is clear. No oral mucosal lesions are identified. Neck is clear without evidence of cervical or supraclavicular adenopathy. Lungs are clear to A&P. Cardiac examination is essentially unremarkable with regular rate and rhythm without murmur rub or thrill. Abdomen is benign with no organomegaly or masses noted. Motor sensory and DTR levels are equal and symmetric in the upper and lower extremities. Cranial nerves II through XII are grossly intact. Proprioception is intact. No peripheral adenopathy or edema is identified. No motor or  sensory levels are noted. Crude visual fields are within normal range.  RADIOLOGY RESULTS: Mammograms reviewed compatible with above-stated findings  PLAN: Present time patient is doing well now out over 4 years with no evidence of disease.  I am pleased with her overall progress.  I at this time I am going to discontinue follow-up care.  I be happy to reevaluate her at any time should that be indicated.  Patient knows to call with any concerns.  I would like to take this opportunity to thank you for allowing me to participate in the care of your patient.Noreene Filbert, MD

## 2022-04-20 ENCOUNTER — Ambulatory Visit
Admission: RE | Admit: 2022-04-20 | Discharge: 2022-04-20 | Disposition: A | Discharge status: Home / Self Care | Payer: 59 | Source: Ambulatory Visit | Attending: Oncology | Admitting: Oncology

## 2022-04-20 DIAGNOSIS — D0512 Intraductal carcinoma in situ of left breast: Secondary | ICD-10-CM | POA: Diagnosis present

## 2022-07-13 NOTE — Patient Instructions (Addendum)
Asthma COPD overlap Continue Trelegy 200-1 puff once a day to prevent cough or wheeze Continue albuterol 2 puffs once every 4 hours as needed for cough or wheeze You may use albuterol 2 puffs 5 to 15 minutes before activity to decrease cough or wheeze  Allergic rhinitis Continue allergen avoidance measures directed toward grass pollen and dog as listed below Continue an over-the-counter antihistamine once a day as needed for runny nose or itch Continue Dymista 2 sprays in each nostril twice a day as needed for nasal symptoms.aasal Consider saline nasal rinses as needed for nasal symptoms. Use this before any medicated nasal sprays for best result Begin saline nasal gel as needed for dry nostrils  Reflux Continue dietary and lifestyle modifications as listed below Continue omeprazole 20 mg once a day for reflux control. Take this medication 30 minutes before your first meal for best results  Tobacco use Continue to cut down on smoking and best to quit  Call the clinic if this treatment plan is not working well for you  Follow up in 6 months or sooner if needed.  Reducing Pollen Exposure The American Academy of Allergy, Asthma and Immunology suggests the following steps to reduce your exposure to pollen during allergy seasons. Do not hang sheets or clothing out to dry; pollen may collect on these items. Do not mow lawns or spend time around freshly cut grass; mowing stirs up pollen. Keep windows closed at night.  Keep car windows closed while driving. Minimize morning activities outdoors, a time when pollen counts are usually at their highest. Stay indoors as much as possible when pollen counts or humidity is high and on windy days when pollen tends to remain in the air longer. Use air conditioning when possible.  Many air conditioners have filters that trap the pollen spores. Use a HEPA room air filter to remove pollen form the indoor air you breathe.  Control of Dog or Cat  Allergen Avoidance is the best way to manage a dog or cat allergy. If you have a dog or cat and are allergic to dog or cats, consider removing the dog or cat from the home. If you have a dog or cat but don't want to find it a new home, or if your family wants a pet even though someone in the household is allergic, here are some strategies that may help keep symptoms at bay:  Keep the pet out of your bedroom and restrict it to only a few rooms. Be advised that keeping the dog or cat in only one room will not limit the allergens to that room. Don't pet, hug or kiss the dog or cat; if you do, wash your hands with soap and water. High-efficiency particulate air (HEPA) cleaners run continuously in a bedroom or living room can reduce allergen levels over time. Regular use of a high-efficiency vacuum cleaner or a central vacuum can reduce allergen levels. Giving your dog or cat a bath at least once a week can reduce airborne allergen.

## 2022-07-13 NOTE — Progress Notes (Signed)
[address], [address] [place] [address] Dept: [phone]  FOLLOW UP NOTE  Patient ID: Denise Reyes, female    DOB: June 22, 1966  Age: 56 y.o. MRN: [id number] Date of Office Visit: 07/14/2022  Assessment  Chief Complaint: Asthma (With change in season has been having some cough ) and Medication Refill (Albuterol, has 2 trelegy left )  HPI Denise Reyes is a 56 year old female who presents to the clinic for follow-up visit.  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  Her current problem list includes ductal carcinoma in situ for which she continues to follow-up with [facility].  At today's visit, she reports her asthma/COPD has been moderately well controlled with symptoms including occasional shortness of breath with talking, infrequent wheeze, and daily cough producing clear thick mucus. She reports that these symptoms began a few weeks ago with the most recent weather changes. She continues Trelegy 200-1 puff once a day and has used albuterol about 3 times a week over the last 3 weeks with relief of symptoms. She continues to smoke about 1 1/2 packs of cigarettes a day.  Her last low-dose lung cancer screening was on 10/12/2021 and a yearly follow-up low-dose screening CT was recommended at that time.  Allergic rhinitis is reported as moderately well controlled with symptoms including intermittent clear rhinorrhea, nasal congestion, sneezing, and cough producing clear mucus. She continues cetirizine 10 mg once a day and rarely uses nasal steroid sprays. She continues saline nasal rinses almost daily. She reports infrequent dried blood on the tissue after blowing. She is not currently using a nasal saline gel.  Her last environmental allergy testing was on 12/18/2019 was positive to grass pollen and dog. Allergic conjunctivitis is reported  as moderately well controlled with occasional itching as the main symptom.  She continues on Zaditor as needed with relief of symptoms.  Reflux is reported as moderately well controlled with a " heavy feeling" in her chest after eating certain foods including garlic bread.  She continues omeprazole 20 mg once a day with relief of symptoms.   Drug Allergies:  No Known Allergies  Physical Exam: BP 118/72   Pulse (!) 116   Temp 98.2 F (36.8 C)   Resp 18   Ht 5' (1.524 m)   Wt 140 lb 6 oz (63.7 kg)   SpO2 94%   BMI 27.42 kg/m    Physical Exam Vitals reviewed.  Constitutional:      Appearance: Normal appearance.  HENT:     Head: Normocephalic and atraumatic.     Right Ear: Tympanic membrane normal.     Left Ear: Tympanic membrane normal.     Nose:     Comments: Bilateral nares slightly erythematous with clear nasal drainage noted.  Pharynx normal.  Ears normal.  Eyes normal.    Mouth/Throat:     Pharynx: Oropharynx is clear.  Eyes:     Conjunctiva/sclera: Conjunctivae normal.  Cardiovascular:     Rate and Rhythm: Normal rate and regular rhythm.     Heart sounds: Normal heart sounds. No murmur heard. Pulmonary:     Effort: Pulmonary effort is normal.     Breath sounds: Normal breath sounds.     Comments: Lungs clear to auscultation Musculoskeletal:        General: Normal range of motion.     Cervical back: Normal range  of motion and neck supple.  Skin:    General: Skin is warm and dry.  Neurological:     Mental Status: She is alert and oriented to person, place, and time.  Psychiatric:        Mood and Affect: Mood normal.        Behavior: Behavior normal.        Thought Content: Thought content normal.        Judgment: Judgment normal.     Diagnostics: FVC 2.49, FEV1 1.73. Predicted FVC 2.73, predicted FEV1 2.19. Spirometry indicates normal ventilatory function  Assessment and Plan: 1. 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    Dispense:  18 g    Refill:  1   TRELEGY ELLIPTA 200-62.5-25 MCG/ACT AEPB    Sig: INHALE 1 PUFF INTO THE LUNGS DAILY. RINSE MOUTH AFTER USE    Dispense:  60 each    Refill:  5   Azelastine-Fluticasone 137-50 MCG/ACT SUSP    Sig: Place 1 spray into the nose in the morning and at bedtime. For nasal symptoms.    Dispense:  23 g    Refill:  5    Please keep on the file for when the patient needs to pick up.   omeprazole (PRILOSEC) 20 MG capsule    Sig: Take 1 capsule (20 mg total) by mouth daily.    Dispense:  30 capsule    Refill:  5    Patient Instructions  Asthma COPD overlap Continue Trelegy 200-1 puff once a day to prevent cough or wheeze Continue albuterol 2 puffs once every 4 hours as needed for cough or wheeze You may use albuterol 2 puffs 5 to 15 minutes before activity to decrease cough or wheeze  Allergic rhinitis Continue allergen avoidance measures directed toward grass pollen and dog as listed below Continue an over-the-counter antihistamine once a day as needed for runny nose or itch Continue Dymista 2 sprays in each nostril twice a day as needed for nasal symptoms.aasal Consider saline nasal rinses as needed for nasal symptoms. Use this before any medicated nasal sprays for best result Begin saline nasal gel as needed for dry nostrils  Reflux Continue dietary and lifestyle modifications as listed below Continue omeprazole 20 mg once a day for reflux control. Take this medication 30 minutes before your first meal for best results  Tobacco use Continue to cut down on smoking and best to quit  Call the clinic if this treatment plan is not working well for you  Follow up in 6 months or sooner if needed.   Return in about 6 months (around  01/13/2023), or if symptoms worsen or fail to improve.    Thank you for the opportunity to care for this patient.  Please do not hesitate to contact me with questions.  Gareth Morgan, FNP [facility]

## 2022-07-14 ENCOUNTER — Ambulatory Visit (INDEPENDENT_AMBULATORY_CARE_PROVIDER_SITE_OTHER): Payer: 59 | Admitting: Family Medicine

## 2022-07-14 ENCOUNTER — Encounter: Payer: Self-pay | Admitting: Family Medicine

## 2022-07-14 ENCOUNTER — Telehealth: Payer: Self-pay

## 2022-07-14 VITALS — BP 118/72 | HR 116 | Temp 98.2°F | Resp 18 | Ht 60.0 in | Wt 140.4 lb

## 2022-07-14 DIAGNOSIS — J4489 Other specified chronic obstructive pulmonary disease: Secondary | ICD-10-CM

## 2022-07-14 MED ORDER — OMEPRAZOLE 20 MG PO CPDR: 20.0000 mg | DELAYED_RELEASE_CAPSULE | Freq: Every day | ORAL | 5 refills | Status: DC

## 2022-07-14 MED ORDER — ALBUTEROL SULFATE HFA 108 (90 BASE) MCG/ACT IN AERS: 2.0000 | INHALATION_SPRAY | RESPIRATORY_TRACT | 1 refills | Status: DC | PRN

## 2022-07-14 MED ORDER — AZELASTINE-FLUTICASONE 137-50 MCG/ACT NA SUSP: 1.0000 | Freq: Two times a day (BID) | NASAL | 5 refills | Status: DC

## 2022-07-14 MED ORDER — TRELEGY ELLIPTA 200-62.5-25 MCG/ACT IN AEPB: INHALATION_SPRAY | RESPIRATORY_TRACT | 5 refills | Status: DC

## 2022-07-14 NOTE — Telephone Encounter (Signed)
PA request received via CMM through [facility]  for Albuterol HFA 18gm.  Preferred alternative of Albuterol HFA 8.5gm covered by current plan  Key: [id number]  PA not submitted at this time.   Please advise if med change is appropriate.

## 2022-07-26 ENCOUNTER — Other Ambulatory Visit (HOSPITAL_COMMUNITY): Payer: Self-pay

## 2022-07-26 ENCOUNTER — Encounter: Payer: Self-pay | Admitting: Oncology

## 2022-09-14 ENCOUNTER — Encounter: Payer: Self-pay | Admitting: Oncology

## 2022-09-14 ENCOUNTER — Inpatient Hospital Stay: Payer: 59

## 2022-09-14 ENCOUNTER — Inpatient Hospital Stay (HOSPITAL_BASED_OUTPATIENT_CLINIC_OR_DEPARTMENT_OTHER): Payer: 59 | Admitting: Oncology

## 2022-09-14 ENCOUNTER — Inpatient Hospital Stay: Payer: 59 | Attending: Oncology

## 2022-09-14 VITALS — BP 111/72 | HR 99 | Temp 98.6°F | Resp 18 | Wt 144.6 lb

## 2022-09-14 DIAGNOSIS — M8580 Other specified disorders of bone density and structure, unspecified site: Secondary | ICD-10-CM

## 2022-09-14 DIAGNOSIS — Z79811 Long term (current) use of aromatase inhibitors: Secondary | ICD-10-CM | POA: Diagnosis not present

## 2022-09-14 DIAGNOSIS — D0512 Intraductal carcinoma in situ of left breast: Secondary | ICD-10-CM | POA: Diagnosis present

## 2022-09-14 DIAGNOSIS — Z79899 Other long term (current) drug therapy: Secondary | ICD-10-CM | POA: Insufficient documentation

## 2022-09-14 DIAGNOSIS — Z720 Tobacco use: Secondary | ICD-10-CM | POA: Diagnosis not present

## 2022-09-14 LAB — CBC WITH DIFFERENTIAL/PLATELET
Abs Immature Granulocytes: 0.02 10*3/uL (ref 0.00–0.07)
Basophils Absolute: 0.1 10*3/uL (ref 0.0–0.1)
Basophils Relative: 1 %
Eosinophils Absolute: 0.1 10*3/uL (ref 0.0–0.5)
Eosinophils Relative: 1 %
HCT: 41.8 % (ref 36.0–46.0)
Hemoglobin: 14.2 g/dL (ref 12.0–15.0)
Immature Granulocytes: 0 %
Lymphocytes Relative: 28 %
Lymphs Abs: 2.6 10*3/uL (ref 0.7–4.0)
MCH: 32.2 pg (ref 26.0–34.0)
MCHC: 34 g/dL (ref 30.0–36.0)
MCV: 94.8 fL (ref 80.0–100.0)
Monocytes Absolute: 0.7 10*3/uL (ref 0.1–1.0)
Monocytes Relative: 7 %
Neutro Abs: 5.9 10*3/uL (ref 1.7–7.7)
Neutrophils Relative %: 63 %
Platelets: 303 10*3/uL (ref 150–400)
RBC: 4.41 MIL/uL (ref 3.87–5.11)
RDW: 12.1 % (ref 11.5–15.5)
WBC: 9.3 10*3/uL (ref 4.0–10.5)
nRBC: 0 % (ref 0.0–0.2)

## 2022-09-14 LAB — COMPREHENSIVE METABOLIC PANEL
ALT: 15 U/L (ref 0–44)
AST: 19 U/L (ref 15–41)
Albumin: 4 g/dL (ref 3.5–5.0)
Alkaline Phosphatase: 49 U/L (ref 38–126)
Anion gap: 9 (ref 5–15)
BUN: 13 mg/dL (ref 6–20)
CO2: 26 mmol/L (ref 22–32)
Calcium: 9 mg/dL (ref 8.9–10.3)
Chloride: 98 mmol/L (ref 98–111)
Creatinine, Ser: 0.59 mg/dL (ref 0.44–1.00)
GFR, Estimated: >60 mL/min (ref >60)
Glucose, Bld: 109 mg/dL — ABNORMAL HIGH (ref 70–99)
Potassium: 4.2 mmol/L (ref 3.5–5.1)
Sodium: 133 mmol/L — ABNORMAL LOW (ref 135–145)
Total Bilirubin: 0.6 mg/dL (ref 0.3–1.2)
Total Protein: 6.7 g/dL (ref 6.5–8.1)

## 2022-09-14 MED ORDER — ZOLEDRONIC ACID 4 MG/100ML IV SOLN
4.0000 mg | Freq: Once | INTRAVENOUS | Status: AC
  Administered 2022-09-14: 4 mg via INTRAVENOUS
  Filled 2022-09-14: qty 100

## 2022-09-14 MED ORDER — SODIUM CHLORIDE 0.9 % IV SOLN
Freq: Once | INTRAVENOUS | Status: AC
  Filled 2022-09-14: qty 250

## 2022-09-14 NOTE — Assessment & Plan Note (Signed)
#   DCIS s/p lumpectomy and adjuvant RT.  Currently on adjuvant endocrine therapy with letrozole.  She tolerates well with manageable side effects. Recommend patient to continue letrozole 2.5 mg daily.  Prescription sent to pharmacy. Duration of treatment total 5 years -till September 2024. Patient has bilateral mammogram ordered through her gynecologist

## 2022-09-14 NOTE — Progress Notes (Signed)
Hematology/Oncology Progress note [phone] [phone]     REASON FOR VISIT Follow up for DCIS  ASSESSMENT & PLAN:   Ductal carcinoma in situ (DCIS) of left breast # DCIS s/p lumpectomy and adjuvant RT.  Currently on adjuvant endocrine therapy with letrozole.  She tolerates well with manageable side effects. Recommend patient to continue letrozole 2.5 mg daily.  Prescription sent to pharmacy. Duration of treatment total 5 years -till September 2024. Patient has bilateral mammogram ordered through her gynecologist  Osteopenia #Osteopenia in the context of chronic aromatase inhibitor use. Continue Zometa every 6 months Continue calcium and vitamin D supplementation. 03/2022 DEXA showed osteopenia.   Tobacco user  smoke cessation was reviewed and discussed with patient.   Patient has establish care with lung cancer screening program and has been up-to-date for screening.   Orders Placed This Encounter  Procedures   CBC with Differential/Platelet    Standing Status:   Future    Standing Expiration Date:   09/14/2023   CMP ([place] only)    Standing Status:   Future    Standing Expiration Date:   09/15/2023   Follow up in 6 months.  All questions were answered. The patient knows to call the clinic with any problems, questions or concerns.  Earlie Server, MD, PhD [facility] Hematology Oncology 09/14/2022    HISTORY OF PRESENTING ILLNESS:  Patient had a mammogram done in May 2019.  Mammogram showed indeterminate calcifications in the upper outer quadrant of the left breast for which biopsy is indicated.  Patient is status post stereotactic guided core biopsy of the left breast calcifications. 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   Patient underwent left breast lumpectomy on 01/23/2018,  pathology showed fibrocystic change with microcalcifications Patient was referred to oncology for further evaluation and management.   # Patient's case was discussed at breast tumor board on 02/11/2018. patient that although her DCIS foci is very small, her breast has very proliferative background, consistent with hormone replacement treatment.  Status post adjuvant RT completed in September 2019. Letrozole was started in September 2019   # Postmenopausal status: obtained patient's GYN operation report from Dr. Louann Liv office. Patient is in menopasue state as she had had bilateral salpingo-oophorectomy and hysterectomy on 12/30/2013. Pathology reviewed.  Showed benign essentially unremarkable cervix, benign proliferative endometrium associated with benign endometrial polyp.  Adenomyosis, leiomyomata, benign right ovary with small foci of endometriosis, benign right paratubal cyst essentially unremarkable left ovary and the fallopian tube.  Marland Kitchen #We obtained her records from GYN office and reviewed. DEXA done in May 2019 showed left femoral neck T score -1.1, AP spine -0.3, right femoral neck -1.1  #September 2019, started on letrozole  INTERVAL HISTORY Denise Reyes is a 57 y.o. female who has above history reviewed by me today presents for management of DCIS. Patient takes letrozole.  Patient has manageable side effects.  She denies any new breast concerns or any other new complaints.    Review of Systems  Constitutional:  Negative for chills, fever, malaise/fatigue and weight loss.  HENT:  Negative for nosebleeds and sore throat.   Eyes:  Negative for double vision, photophobia and redness.  Respiratory:  Negative for cough, shortness of breath and wheezing.   Cardiovascular:  Negative for chest pain, palpitations, orthopnea and leg swelling.  Gastrointestinal:  Negative for abdominal pain, blood in stool,  nausea and vomiting.  Genitourinary:  Negative for dysuria.  Musculoskeletal:   Positive for joint pain. Negative for back pain, myalgias and neck pain.  Skin:  Negative for itching and rash.  Neurological:  Negative for dizziness, tingling and tremors.  Endo/Heme/Allergies:  Negative for environmental allergies. Does not bruise/bleed easily.       Hot flash  Psychiatric/Behavioral:  Negative for depression and hallucinations.     MEDICAL HISTORY:  Past Medical History:  Diagnosis Date   Allergy    PT CURRENTLY TAKING ABX AND PREDNISONE GIVEN BY DR Tami Ribas ON 01-16-18 FOR INFECTION THAT SHE GETS THIS TIME EVERY YEAR   Anxiety    Arthritis    knees, wrists   Asthma    allergy induced    Breast cancer ([place])    Cancer ([place])    GERD (gastroesophageal reflux disease)    occ-no meds   History of kidney stones    h/o   Personal history of radiation therapy 2019   LEFT lumpectomy   Problems with swallowing and mastication    Psoriasis    Recurrent upper respiratory infection (URI)    Special screening for malignant neoplasms, colon    Wears contact lenses     SURGICAL HISTORY: Past Surgical History:  Procedure Laterality Date   BILATERAL SALPINGOOPHORECTOMY Bilateral 12/30/2013   BREAST BIOPSY Left    DUCTAL CARCINOMA IN SITU (DCIS), LOW NUCLEAR GRADE.   BREAST BIOPSY Left 01/23/2018   Procedure: BREAST BIOPSY/PARTIAL MASTECTOMY WITH NEEDLE LOCALIZATION;  Surgeon: Florene Glen, MD;  Location: [facility];  Service: General;  Laterality: Left;   BREAST EXCISIONAL BIOPSY Left 01/23/2018   lumectomy with NL    BREAST LUMPECTOMY Left 12/2017   DUCTAL CARCINOMA IN SITU   COLONOSCOPY WITH PROPOFOL N/A 07/07/2016   Procedure: COLONOSCOPY WITH PROPOFOL;  Surgeon: Lucilla Lame, MD;  Location: [place];  Service: Endoscopy;  Laterality: N/A;   ESOPHAGOGASTRODUODENOSCOPY (EGD) WITH PROPOFOL N/A 07/07/2016   Procedure: ESOPHAGOGASTRODUODENOSCOPY (EGD) WITH PROPOFOL;  Surgeon: Lucilla Lame, MD;  Location: [place];  Service: Endoscopy;  Laterality:  N/A;   LAPAROSCOPIC ASSISTED VAGINAL HYSTERECTOMY  12/30/2013   URETHRA SURGERY     14 months old    SOCIAL HISTORY: Social History   Socioeconomic History   Marital status: Single    Spouse name: Not on file   Number of children: Not on file   Years of education: Not on file   Highest education level: Not on file  Occupational History   Not on file  Tobacco Use   Smoking status: Every Day    Packs/day: 1.25    Years: 41.00    Total pack years: 51.25    Types: Cigarettes   Smokeless tobacco: Never  Vaping Use   Vaping Use: Never used  Substance and Sexual Activity   Alcohol use: Yes    Alcohol/week: 2.0 standard drinks of alcohol    Types: 2 Cans of beer per week    Comment: occ weekend beers   Drug use: No   Sexual activity: Not on file  Other Topics Concern   Not on file  Social History Narrative   Not on file   Social Determinants of Health   Financial Resource Strain: Not on file  Food Insecurity: Not on file  Transportation Needs: Not on file  Physical Activity: Not on file  Stress: Not on file  Social Connections: Not on file  Intimate Partner Violence: Not on file    FAMILY  HISTORY: Family History  Problem Relation Age of Onset   Hypertension Mother    High Cholesterol Mother    Allergic rhinitis Mother    Asthma Mother    Hypertension Father    Heart disease Father    COPD Father    High Cholesterol Father    Heart disease Paternal Grandfather    Cancer Sister        ovarian   Allergic rhinitis Sister    Cancer Maternal Aunt    Cancer Paternal Grandmother 21       breast   Breast cancer Paternal Grandmother    Angioedema Neg Hx    Atopy Neg Hx    Eczema Neg Hx    Immunodeficiency Neg Hx    Urticaria Neg Hx     ALLERGIES:  has No Known Allergies.  MEDICATIONS:  Current Outpatient Medications  Medication Sig Dispense Refill   albuterol (VENTOLIN HFA) 108 (90 Base) MCG/ACT inhaler Inhale 2 puffs into the lungs every 4 (four) hours  as needed for wheezing or shortness of breath (coughing, chest tightness). 18 g 1   atorvastatin (LIPITOR) 20 MG tablet Take 20 mg by mouth every evening.     Azelastine-Fluticasone 137-50 MCG/ACT SUSP Place 1 spray into the nose in the morning and at bedtime. For nasal symptoms. 23 g 5   calcium-vitamin D (OSCAL WITH D) 500-200 MG-UNIT tablet Take 1 tablet by mouth daily with breakfast.     cyanocobalamin 1000 MCG tablet Take 1,000 mcg by mouth daily.     letrozole (FEMARA) 2.5 MG tablet Take 1 tablet (2.5 mg total) by mouth daily. 30 tablet 5   Omega-3 Fatty Acids (OMEGA-3 FISH OIL PO) Take by mouth daily.     omeprazole (PRILOSEC) 20 MG capsule Take 1 capsule (20 mg total) by mouth daily. 30 capsule 5   TRELEGY ELLIPTA 200-62.5-25 MCG/ACT AEPB INHALE 1 PUFF INTO THE LUNGS DAILY. RINSE MOUTH AFTER USE 60 each 5   vitamin C (ASCORBIC ACID) 500 MG tablet Take 500 mg by mouth daily.     albuterol (VENTOLIN HFA) 108 (90 Base) MCG/ACT inhaler Inhale 2 puffs into the lungs every 4 (four) hours as needed for wheezing or shortness of breath. (Patient not taking: Reported on 09/14/2022) 1 each 1   No current facility-administered medications for this visit.     PHYSICAL EXAMINATION: ECOG PERFORMANCE STATUS: 0 - Asymptomatic Vitals:   09/14/22 1413  BP: 111/72  Pulse: 99  Resp: 18  Temp: 98.6 F (37 C)   Filed Weights   09/14/22 1413  Weight: 144 lb 9.6 oz (65.6 kg)    Physical Exam Constitutional:      General: She is not in acute distress. HENT:     Head: Normocephalic and atraumatic.  Eyes:     General: No scleral icterus.    Conjunctiva/sclera: Conjunctivae normal.     Pupils: Pupils are equal, round, and reactive to light.  Cardiovascular:     Rate and Rhythm: Normal rate and regular rhythm.     Heart sounds: Normal heart sounds.  Pulmonary:     Effort: Pulmonary effort is normal. No respiratory distress.     Breath sounds: Normal breath sounds. No wheezing or rales.  Chest:      Chest wall: No tenderness.  Abdominal:     General: Bowel sounds are normal. There is no distension.     Palpations: Abdomen is soft. There is no mass.     Tenderness: There is no  abdominal tenderness.  Musculoskeletal:        General: No deformity. Normal range of motion.     Cervical back: Normal range of motion and neck supple.  Lymphadenopathy:     Cervical: No cervical adenopathy.  Skin:    General: Skin is warm and dry.     Findings: No erythema or rash.  Neurological:     Mental Status: She is alert and oriented to person, place, and time. Mental status is at baseline.     Cranial Nerves: No cranial nerve deficit.     Coordination: Coordination normal.  Psychiatric:        Mood and Affect: Mood normal.        Behavior: Behavior normal.        Thought Content: Thought content normal.   Breast exam was performed in seated and lying down position. Patient is status post left lumpectomy with a well-healed surgical scar. No evidence of any palpable masses. No palpable of axillary adenopathy. No palpable masses or lumps in the right breast.     LABORATORY DATA:  I have reviewed the data as listed    Latest Ref Rng & Units 09/14/2022    2:02 PM 03/14/2022    1:05 PM 09/13/2021   12:47 PM  CBC  WBC 4.0 - 10.5 K/uL 9.3  7.6  8.1   Hemoglobin 12.0 - 15.0 g/dL 14.2  13.9  15.7   Hematocrit 36.0 - 46.0 % 41.8  40.0  45.5   Platelets 150 - 400 K/uL 303  266  311       Latest Ref Rng & Units 09/14/2022    2:02 PM 03/14/2022    1:05 PM 09/13/2021   12:47 PM  CMP  Glucose 70 - 99 mg/dL 109  89  96   BUN 6 - 20 mg/dL 13  18  16   $ Creatinine 0.44 - 1.00 mg/dL 0.59  0.45  0.51   Sodium 135 - 145 mmol/L 133  136  134   Potassium 3.5 - 5.1 mmol/L 4.2  3.7  4.2   Chloride 98 - 111 mmol/L 98  105  98   CO2 22 - 32 mmol/L 26  21  28   $ Calcium 8.9 - 10.3 mg/dL 9.0  8.9  9.6   Total Protein 6.5 - 8.1 g/dL 6.7  6.7  7.4   Total Bilirubin 0.3 - 1.2 mg/dL 0.6  0.8  0.5   Alkaline  Phos 38 - 126 U/L 49  46  50   AST 15 - 41 U/L 19  13  16   $ ALT 0 - 44 U/L 15  12  14     $ RADIOGRAPHIC STUDIES: I have personally reviewed the radiological images as listed and agreed with the findings in the report. No results found.

## 2022-09-14 NOTE — Assessment & Plan Note (Signed)
smoke cessation was reviewed and discussed with patient.   Patient has establish care with lung cancer screening program and has been up-to-date for screening.

## 2022-09-14 NOTE — Patient Instructions (Signed)

## 2022-09-14 NOTE — Assessment & Plan Note (Signed)
#  Osteopenia in the context of chronic aromatase inhibitor use. Continue Zometa every 6 months Continue calcium and vitamin D supplementation. 03/2022 DEXA showed osteopenia.

## 2022-10-11 ENCOUNTER — Encounter: Payer: Self-pay | Admitting: Acute Care

## 2022-10-12 ENCOUNTER — Ambulatory Visit
Admission: RE | Admit: 2022-10-12 | Discharge: 2022-10-12 | Disposition: A | Discharge status: Home / Self Care | Payer: 59 | Source: Ambulatory Visit | Attending: Acute Care | Admitting: Acute Care

## 2022-10-12 DIAGNOSIS — Z87891 Personal history of nicotine dependence: Secondary | ICD-10-CM

## 2022-10-12 DIAGNOSIS — F17210 Nicotine dependence, cigarettes, uncomplicated: Secondary | ICD-10-CM

## 2022-10-17 ENCOUNTER — Other Ambulatory Visit: Payer: Self-pay

## 2022-10-17 DIAGNOSIS — F17210 Nicotine dependence, cigarettes, uncomplicated: Secondary | ICD-10-CM

## 2022-10-17 DIAGNOSIS — Z87891 Personal history of nicotine dependence: Secondary | ICD-10-CM

## 2022-11-13 ENCOUNTER — Other Ambulatory Visit: Payer: Self-pay

## 2022-11-13 ENCOUNTER — Emergency Department
Admission: EM | Admit: 2022-11-13 | Discharge: 2022-11-13 | Disposition: A | Discharge status: Home / Self Care | Payer: 59 | Attending: Emergency Medicine | Admitting: Emergency Medicine

## 2022-11-13 DIAGNOSIS — R143 Flatulence: Secondary | ICD-10-CM | POA: Insufficient documentation

## 2022-11-13 DIAGNOSIS — J449 Chronic obstructive pulmonary disease, unspecified: Secondary | ICD-10-CM | POA: Diagnosis not present

## 2022-11-13 DIAGNOSIS — R141 Gas pain: Secondary | ICD-10-CM

## 2022-11-13 DIAGNOSIS — R109 Unspecified abdominal pain: Secondary | ICD-10-CM

## 2022-11-13 DIAGNOSIS — R1031 Right lower quadrant pain: Secondary | ICD-10-CM | POA: Diagnosis present

## 2022-11-13 LAB — COMPREHENSIVE METABOLIC PANEL
ALT: 12 U/L (ref 0–44)
AST: 14 U/L — ABNORMAL LOW (ref 15–41)
Albumin: 3.9 g/dL (ref 3.5–5.0)
Alkaline Phosphatase: 55 U/L (ref 38–126)
Anion gap: 9 (ref 5–15)
BUN: 6 mg/dL (ref 6–20)
CO2: 27 mmol/L (ref 22–32)
Calcium: 8.9 mg/dL (ref 8.9–10.3)
Chloride: 102 mmol/L (ref 98–111)
Creatinine, Ser: 0.58 mg/dL (ref 0.44–1.00)
GFR, Estimated: >60 mL/min (ref >60)
Glucose, Bld: 89 mg/dL (ref 70–99)
Potassium: 3.7 mmol/L (ref 3.5–5.1)
Sodium: 138 mmol/L (ref 135–145)
Total Bilirubin: 0.8 mg/dL (ref 0.3–1.2)
Total Protein: 6.8 g/dL (ref 6.5–8.1)

## 2022-11-13 LAB — URINALYSIS, ROUTINE W REFLEX MICROSCOPIC
Bilirubin Urine: NEGATIVE
Glucose, UA: NEGATIVE mg/dL
Hgb urine dipstick: NEGATIVE
Ketones, ur: 5 mg/dL — AB
Leukocytes,Ua: NEGATIVE
Nitrite: NEGATIVE
Protein, ur: NEGATIVE mg/dL
Specific Gravity, Urine: 1.001 — ABNORMAL LOW (ref 1.005–1.030)
pH: 6 (ref 5.0–8.0)

## 2022-11-13 LAB — CBC
HCT: 42.1 % (ref 36.0–46.0)
Hemoglobin: 14.6 g/dL (ref 12.0–15.0)
MCH: 32.3 pg (ref 26.0–34.0)
MCHC: 34.7 g/dL (ref 30.0–36.0)
MCV: 93.1 fL (ref 80.0–100.0)
Platelets: 322 10*3/uL (ref 150–400)
RBC: 4.52 MIL/uL (ref 3.87–5.11)
RDW: 12.1 % (ref 11.5–15.5)
WBC: 10.2 10*3/uL (ref 4.0–10.5)
nRBC: 0 % (ref 0.0–0.2)

## 2022-11-13 LAB — LIPASE, BLOOD: Lipase: 31 U/L (ref 11–51)

## 2022-11-13 MED ORDER — SIMETHICONE 80 MG PO CHEW: 80.0000 mg | CHEWABLE_TABLET | Freq: Four times a day (QID) | ORAL | 2 refills | Status: DC | PRN

## 2022-11-13 NOTE — ED Notes (Signed)
Pt discharge to home. Pt VSS, GCS 15, NAD. Pt verbalized understanding of discharge instructions with no additional questions at this time.  

## 2022-11-13 NOTE — ED Triage Notes (Signed)
Pt comes with c/o right sided belly pain since las Thursday. Pt states no N/V. Pt states lot of feeling full and burping.

## 2022-11-13 NOTE — ED Provider Notes (Signed)
[facility] Provider Note   Event Date/Time   First MD Initiated Contact with Patient 11/13/22 1459     (approximate) History  Abdominal Pain  HPI Denise Reyes is a 57 y.o. female with a past medical history of tobacco abuse and COPD as well as GERD who presents complaining of right lower quadrant abdominal pain that has been intermittent over the last 5 days.  Patient states that this pain is improved with belching or flatulence.  Patient states she has felt abdominal bloating over this time as well. ROS: Patient currently denies any fever, vision changes, tinnitus, difficulty speaking, facial droop, sore throat, chest pain, shortness of breath, nausea/vomiting/diarrhea, dysuria, or weakness/numbness/paresthesias in any extremity   Physical Exam  Triage Vital Signs: ED Triage Vitals  Enc Vitals Group     BP 11/13/22 1442 (!) 118/91     Pulse Rate 11/13/22 1442 (!) 101     Resp 11/13/22 1442 17     Temp 11/13/22 1442 98 F (36.7 C)     Temp Source 11/13/22 1442 Oral     SpO2 11/13/22 1442 100 %     Weight --      Height --      Head Circumference --      Peak Flow --      Pain Score 11/13/22 1441 6     Pain Loc --      Pain Edu? --      Excl. in GC? --    Most recent vital signs: Vitals:   11/13/22 1442  BP: (!) 118/91  Pulse: (!) 101  Resp: 17  Temp: 98 F (36.7 C)  SpO2: 100%   General: Awake, oriented x4. CV:  Good peripheral perfusion.  Resp:  Normal effort.  Abd:  No distention.  Other:  Overweight middle-aged Caucasian female sitting in chair in hallway in no acute distress ED Results / Procedures / Treatments  Labs (all labs ordered are listed, but only abnormal results are displayed) Labs Reviewed  COMPREHENSIVE METABOLIC PANEL - Abnormal; Notable for the following components:      Result Value   AST 14 (*)    All other components within normal limits  URINALYSIS, ROUTINE W REFLEX MICROSCOPIC - Abnormal; Notable for the  following components:   Color, Urine STRAW (*)    APPearance CLEAR (*)    Specific Gravity, Urine 1.001 (*)    Ketones, ur 5 (*)    All other components within normal limits  LIPASE, BLOOD  CBC  PROCEDURES: Critical Care performed: No Procedures MEDICATIONS ORDERED IN ED: Medications - No data to display IMPRESSION / MDM / ASSESSMENT AND PLAN / ED COURSE  I reviewed the triage vital signs and the nursing notes.                             Patient's presentation is most consistent with acute presentation with potential threat to life or bodily function. Patients symptoms not typical for emergent causes of abdominal pain such as, but not limited to, appendicitis, abdominal aortic aneurysm, surgical biliary disease, pancreatitis, SBO, mesenteric ischemia, serious intra-abdominal bacterial illness. Presentation also not typical of gynecologic emergencies such as TOA, Ovarian Torsion, PID. Not Ectopic. Doubt atypical ACS.  Pt tolerating PO. Disposition: Patient will be discharged with strict return precautions and follow up with primary MD within 12-24 hours for further evaluation. Patient understands that this still may have an early  presentation of an emergent medical condition such as appendicitis that will require a recheck.   FINAL CLINICAL IMPRESSION(S) / ED DIAGNOSES   Final diagnoses:  Right lower quadrant abdominal pain  Abdominal cramping  Flatulence, eructation, and gas pain   Rx / DC Orders   ED Discharge Orders          Ordered    simethicone (GAS-X) 80 MG chewable tablet  4 times daily PRN        11/13/22 1547           Note:  This document was prepared using Dragon voice recognition software and may include unintentional dictation errors.   Merwyn Katos, MD 11/14/22 [phone]

## 2022-11-17 ENCOUNTER — Other Ambulatory Visit: Payer: Self-pay | Admitting: Family Medicine

## 2022-11-17 DIAGNOSIS — R140 Abdominal distension (gaseous): Secondary | ICD-10-CM

## 2022-11-17 DIAGNOSIS — R1031 Right lower quadrant pain: Secondary | ICD-10-CM

## 2022-11-22 ENCOUNTER — Ambulatory Visit
Admission: RE | Admit: 2022-11-22 | Discharge: 2022-11-22 | Disposition: A | Discharge status: Home / Self Care | Payer: 59 | Source: Ambulatory Visit | Attending: Family Medicine | Admitting: Family Medicine

## 2022-11-22 DIAGNOSIS — R140 Abdominal distension (gaseous): Secondary | ICD-10-CM | POA: Insufficient documentation

## 2022-11-22 DIAGNOSIS — R1031 Right lower quadrant pain: Secondary | ICD-10-CM | POA: Diagnosis not present

## 2022-11-22 MED ORDER — IOHEXOL 300 MG/ML  SOLN
100.0000 mL | Freq: Once | INTRAMUSCULAR | Status: AC | PRN
  Administered 2022-11-22: 100 mL via INTRAVENOUS

## 2022-12-06 ENCOUNTER — Ambulatory Visit (INDEPENDENT_AMBULATORY_CARE_PROVIDER_SITE_OTHER): Payer: 59 | Admitting: Surgery

## 2022-12-06 ENCOUNTER — Ambulatory Visit: Payer: 59 | Admitting: Surgery

## 2022-12-06 ENCOUNTER — Encounter: Payer: Self-pay | Admitting: Surgery

## 2022-12-06 VITALS — BP 144/75 | HR 96 | Temp 98.6°F | Ht 59.5 in | Wt 139.0 lb

## 2022-12-06 DIAGNOSIS — K3580 Unspecified acute appendicitis: Secondary | ICD-10-CM

## 2022-12-06 MED ORDER — AMOXICILLIN-POT CLAVULANATE 875-125 MG PO TABS: 1.0000 | ORAL_TABLET | Freq: Two times a day (BID) | ORAL | 0 refills | Status: AC

## 2022-12-06 NOTE — Progress Notes (Signed)
12/06/2022  Reason for Visit: Acute appendicitis  History of Present Illness: Denise Reyes is a 57 y.o. female presenting for evaluation of acute appendicitis.  The patient presented to the ED on 11/13/22 with a few day history of right lower quadrant pain, associated with bloatedness/gassiness.  The pain was intermittent, but when it first started, it was quite significant.  In the ED, her labwork was normal and no imaging study was obtained at the time.  She was discharged with simethicone prescription.  She reports that a few days later the pain continued, although less severe.  She reports that the pain had more transitioned to milder "twinge" going towards the right hip and groin.  Her PCP ordered a CT scan of her abdomen and pelvis, which was done on 11/22/22.  Unfortunately the read was not completed until 11/26/22.  CT scan did how acute appendicitis, with an inflamed appendix, no evidence of perforation or abscess.  Referral was sent but per patient, was taking long to get the referral sent and eventually she called our office on her own to set up an appointment.  The patient reports that her pain has been improving and reports the same discomfort extending to the right hip/groin.  Denies any fevers, chills, chest pain, shortness of breath.  Initially had some decreased appetite, but this has only been intermittent.  She has not received any antibiotics for her appendicitis diagnosis.  Past Medical History: Past Medical History:  Diagnosis Date   Allergy    PT CURRENTLY TAKING ABX AND PREDNISONE GIVEN BY DR Jenne Campus ON 01-16-18 FOR INFECTION THAT SHE GETS THIS TIME EVERY YEAR   Anxiety    Arthritis    knees, wrists   Asthma    allergy induced    Breast cancer (HCC)    Cancer (HCC)    GERD (gastroesophageal reflux disease)    occ-no meds   History of kidney stones    h/o   Personal history of radiation therapy 2019   LEFT lumpectomy   Problems with swallowing and mastication     Psoriasis    Recurrent upper respiratory infection (URI)    Special screening for malignant neoplasms, colon    Wears contact lenses      Past Surgical History: Past Surgical History:  Procedure Laterality Date   BILATERAL SALPINGOOPHORECTOMY Bilateral 12/30/2013   BREAST BIOPSY Left    DUCTAL CARCINOMA IN SITU (DCIS), LOW NUCLEAR GRADE.   BREAST BIOPSY Left 01/23/2018   Procedure: BREAST BIOPSY/PARTIAL MASTECTOMY WITH NEEDLE LOCALIZATION;  Surgeon: Lattie Haw, MD;  Location: [facility];  Service: General;  Laterality: Left;   BREAST EXCISIONAL BIOPSY Left 01/23/2018   lumectomy with NL    BREAST LUMPECTOMY Left 12/2017   DUCTAL CARCINOMA IN SITU   COLONOSCOPY WITH PROPOFOL N/A 07/07/2016   Procedure: COLONOSCOPY WITH PROPOFOL;  Surgeon: Midge Minium, MD;  Location: [facility];  Service: Endoscopy;  Laterality: N/A;   ESOPHAGOGASTRODUODENOSCOPY (EGD) WITH PROPOFOL N/A 07/07/2016   Procedure: ESOPHAGOGASTRODUODENOSCOPY (EGD) WITH PROPOFOL;  Surgeon: Midge Minium, MD;  Location: [facility];  Service: Endoscopy;  Laterality: N/A;   LAPAROSCOPIC ASSISTED VAGINAL HYSTERECTOMY  12/30/2013   URETHRA SURGERY     14 months old    Home Medications: Prior to Admission medications   Medication Sig Start Date End Date Taking? Authorizing Provider  albuterol (VENTOLIN HFA) 108 (90 Base) MCG/ACT inhaler Inhale 2 puffs into the lungs every 4 (four) hours as needed for wheezing or shortness of breath (  coughing, chest tightness). 07/14/22  Yes Ambs, Norvel Richards, FNP  amoxicillin-clavulanate (AUGMENTIN) 875-125 MG tablet Take 1 tablet by mouth 2 (two) times daily for 10 days. 12/06/22 12/16/22 Yes Elona Yinger, Elita Quick, MD  atorvastatin (LIPITOR) 20 MG tablet Take 20 mg by mouth every evening.   Yes [provider]  Azelastine-Fluticasone 137-50 MCG/ACT SUSP Place 1 spray into the nose in the morning and at bedtime. For nasal symptoms. 07/14/22  Yes Ambs, Norvel Richards, FNP  calcium-vitamin D  (OSCAL WITH D) 500-200 MG-UNIT tablet Take 1 tablet by mouth daily with breakfast.   Yes [provider]  cyanocobalamin 1000 MCG tablet Take 1,000 mcg by mouth daily.   Yes [provider]  letrozole (FEMARA) 2.5 MG tablet Take 1 tablet (2.5 mg total) by mouth daily. 03/14/22  Yes Rickard Patience, MD  Omega-3 Fatty Acids (OMEGA-3 FISH OIL PO) Take by mouth daily.   Yes [provider]  omeprazole (PRILOSEC) 20 MG capsule Take 1 capsule (20 mg total) by mouth daily. 07/14/22  Yes Ambs, Norvel Richards, FNP  simethicone (GAS-X) 80 MG chewable tablet Chew 1 tablet (80 mg total) by mouth 4 (four) times daily as needed for flatulence (gas pain, abdominal cramping, or bloating). 11/13/22 11/13/23 Yes Merwyn Katos, MD  TRELEGY ELLIPTA 200-62.5-25 MCG/ACT AEPB INHALE 1 PUFF INTO THE LUNGS DAILY. RINSE MOUTH AFTER USE 07/14/22  Yes Ambs, Norvel Richards, FNP  vitamin C (ASCORBIC ACID) 500 MG tablet Take 500 mg by mouth daily.   Yes [provider]    Allergies: No Known Allergies  Social History:  reports that she has been smoking cigarettes. She has a 51.25 pack-year smoking history. She has been exposed to tobacco smoke. She has never used smokeless tobacco. She reports current alcohol use of about 2.0 standard drinks of alcohol per week. She reports that she does not use drugs.   Family History: Family History  Problem Relation Age of Onset   Hypertension Mother    High Cholesterol Mother    Allergic rhinitis Mother    Asthma Mother    Hypertension Father    Heart disease Father    COPD Father    High Cholesterol Father    Heart disease Paternal Grandfather    Cancer Sister        ovarian   Allergic rhinitis Sister    Cancer Maternal Aunt    Cancer Paternal Grandmother 67       breast   Breast cancer Paternal Grandmother    Angioedema Neg Hx    Atopy Neg Hx    Eczema Neg Hx    Immunodeficiency Neg Hx    Urticaria Neg Hx     Review of Systems: Review of Systems   Constitutional:  Negative for chills and fever.  HENT:  Negative for hearing loss.   Respiratory:  Negative for cough.   Cardiovascular:  Negative for chest pain.  Gastrointestinal:  Positive for abdominal pain (discomfort in the right groin area). Negative for nausea and vomiting.  Genitourinary:  Negative for dysuria.  Musculoskeletal:  Positive for joint pain (discomfort radiating towards the right hip).  Skin:  Negative for rash.  Neurological:  Negative for dizziness.  Psychiatric/Behavioral:  Negative for depression.     Physical Exam BP (!) 144/75   Pulse 96   Temp 98.6 F (37 C)   Ht 4' 11.5" (1.511 m)   Wt 139 lb (63 kg)   SpO2 97%   BMI 27.60 kg/m  CONSTITUTIONAL: No  acute distress, well-nourished HEENT:  Normocephalic, atraumatic, extraocular motion intact. NECK: Trachea is midline, and there is no jugular venous distension.  RESPIRATORY:  Lungs are clear, and breath sounds are equal bilaterally. Normal respiratory effort without pathologic use of accessory muscles. CARDIOVASCULAR: Heart is regular without murmurs, gallops, or rubs. GI: The abdomen is soft, nondistended, with minimal soreness to palpation in the right lower quadrant radiating towards the right hip and right groin area.  No diffuse peritonitis.  MUSCULOSKELETAL:  Normal muscle strength and tone in all four extremities.  No peripheral edema or cyanosis. SKIN: Skin turgor is normal. There are no pathologic skin lesions.  NEUROLOGIC:  Motor and sensation is grossly normal.  Cranial nerves are grossly intact. PSYCH:  Alert and oriented to person, place and time. Affect is normal.  Laboratory Analysis: Labs from 11/13/2022: Sodium 138, potassium 3.7, chloride 102, CO2 27, BUN 6, creatinine 0.58.  Total bilirubin 0.8, AST 14, ALT 12, alkaline phosphatase 55, albumin 3.9.  WBC 10.2, hemoglobin 14.6, hematocrit 42.1, platelets 322.  Imaging: CT abdomen/pelvis on 11/22/2022: IMPRESSION: 1. Findings consistent  with mild acute appendicitis. 2. Hepatic lesion consistent with cavernous hemangioma. 3. Left kidney cyst.  Assessment and Plan: This is a 57 y.o. female with recent episode of acute appendicitis  -Discussed with the patient the findings on her CT scan and labs from her visit to the ED.  Overall her WBC was on the high end of normal in the ED, but no imaging was obtained tthen.  Then after her CT scan was done, no additional intervention was done.  The patient is worried that she's a "ticking time bomb" given her diagnosis.  Her presentation is somewhat atypical and discussed with her that appendicitis is a progressively worsening diagnosis once it starts, it continues until something is done treatment-related.  Discussed with her how there is treatment options for antibiotics vs surgery on a case-by-case scenario.  Given her atypical presentation, may have been easy to miss. --Nonetheless, her CT scan does show appendicitis and discuss that although it's been two weeks since the CT was done, we should still at least treat her with antibiotics, namely Augmentin x 10 days.  I also would like to obtain a repeat CT scan to further evaluate her lingering symptoms and make sure there is no abscess formation.  She's in agreement. --I will call her with the results of the CT scan.  Discussed with her that if any abscess, may need drain with IR, or if any worsening inflammatory changes, may need admission for IV abx.  In the future, discussed with her the role for an interval appendectomy and she's in agreement.  Will schedule her tentatively for a robotic assisted appendectomy in July and she will follow up with me in June to further discuss surgery and for H&P update. --Patient is in agreement with this plan and all of her questions have been answered.  I spent 60 minutes dedicated to the care of this patient on the date of this encounter to include pre-visit review of records, face-to-face time with the  patient discussing diagnosis and management, and any post-visit coordination of care.   Howie Ill, MD [facility]

## 2022-12-06 NOTE — Patient Instructions (Addendum)
We have sent in a prescription for antibiotics into your pharmacy. Please take all of the antibiotics.  You are scheduled for a CT scan of the abdomen at [facility] on 12/15/2022. You will need to arrive at the [facility] entrance at 12:00 pm.   We will call with your results.  If you start to develop worsening pain, chills, or fever call and let us know or report to the ER.   We have scheduled you for an elective Appendectomy. This will be done at [facility] by Dr. Aleen Campi. Please see the following information regarding your surgery.   Typically, our patients are out of work 1-2 weeks following the surgery and may return with a lifting restriction of no more than 15 lbs. For a complete 6 weeks following surgery.  If you have FMLA or Disability paperwork that needs to be filled out, please have your company fax your paperwork to [phone] or you may drop this by either office. This paperwork will be filled out within 3 days after your surgery has been completed.  Also, please review the [facility]) Pre-Care sheet for further information regarding your surgery. Our surgery schedule will call you to verify surgery date and to go over information.  If you have any questions, please do not hesitate to contact our office.  Laparoscopic Appendectomy, Adult, Care After Refer to this sheet in the next few weeks. These instructions provide you with information about caring for yourself after your procedure. Your health care provider may also give you more specific instructions. Your treatment has been planned according to current medical practices, but problems sometimes occur. Call your health care provider if you have any problems or questions after your procedure. What can I expect after the procedure? After the procedure, it is common to have: A decrease in your energy level. Mild pain in the area where the surgical cuts (incisions) were made. Constipation. This can be caused by pain medicine and a decrease in  your activity.  Follow these instructions at home: Medicines Take over-the-counter and prescription medicines only as told by your health care provider. Do not drive for 24 hours if you received a sedative. Do not drive or operate heavy machinery while taking prescription pain medicine. If you were prescribed an antibiotic medicine, take it as told by your health care provider. Do not stop taking the antibiotic even if you start to feel better. Activity For 3 weeks or as long as told by your health care provider: Do not lift anything that is heavier than 10 pounds (4.5 kg). Do not play contact sports. Gradually return to your normal activities. Ask your health care provider what activities are safe for you. Bathing Keep your incisions clean and dry. Clean them as often as told by your health care provider: Gently wash the incisions with soap and water. Rinse the incisions with water to remove all soap. Pat the incisions dry with a clean towel. Do not rub the incisions. You may take showers after 48 hours. Do not take baths, swim, or use hot tubs for 2 weeks or as told by your health care provider. Incision care Follow instructions from your healthcare provider about how to take care of your incisions. Make sure you: Wash your hands with soap and water before you change your bandage (dressing). If soap and water are not available, use hand sanitizer. Change your dressing as told by your health care provider. Leave stitches (sutures), skin glue, or adhesive strips in place. These skin  closures may need to stay in place for 2 weeks or longer. If adhesive strip edges start to loosen and curl up, you may trim the loose edges. Do not remove adhesive strips completely unless your health care provider tells you to do that. Check your incision areas every day for signs of infection. Check for: More redness, swelling, or pain. More fluid or blood. Warmth. Pus or a bad smell. Other  Instructions If you were sent home with a drain, follow instructions from your health care provider about how to care for the drain and how to empty it. Take deep breaths. This helps to prevent your lungs from becoming inflamed. To relieve and prevent constipation: Drink plenty of fluids. Eat plenty of fruits and vegetables. Keep all follow-up visits as told by your health care provider. This is important. Contact a health care provider if: You have more redness, swelling, or pain around an incision. You have more fluid or blood coming from an incision. Your incision feels warm to the touch. You have pus or a bad smell coming from an incision or dressing. Your incision edges break open after your sutures have been removed. You have increasing pain in your shoulders. You feel dizzy or you faint. You develop shortness of breath. You keep feeling nauseous or vomiting. You have diarrhea or you cannot control your bowel functions. You lose your appetite. You develop swelling or pain in your legs. Get help right away if: You have a fever. You develop a rash. You have difficulty breathing. You have sharp pains in your chest. This information is not intended to replace advice given to you by your health care provider. Make sure you discuss any questions you have with your health care provider. Document Released: 07/17/2005 Document Revised: 12/17/2015 Document Reviewed: 01/04/2015 Elsevier Interactive Patient Education  2017 [employer].

## 2022-12-07 ENCOUNTER — Telehealth: Payer: Self-pay | Admitting: Surgery

## 2022-12-07 NOTE — Telephone Encounter (Signed)
Patient has been advised of Pre-Admission date/time, and Surgery date at [facility].  Surgery Date: 02/06/23 Preadmission Testing Date: 01/29/23 (phone 8a-1p)  Patient has been made aware to call [phone], between 1-3:00pm the day before surgery, to find out what time to arrive for surgery.

## 2022-12-15 ENCOUNTER — Ambulatory Visit
Admission: RE | Admit: 2022-12-15 | Discharge: 2022-12-15 | Disposition: A | Discharge status: Home / Self Care | Payer: 59 | Source: Ambulatory Visit | Attending: Surgery | Admitting: Surgery

## 2022-12-15 DIAGNOSIS — K3580 Unspecified acute appendicitis: Secondary | ICD-10-CM

## 2022-12-15 MED ORDER — IOHEXOL 300 MG/ML  SOLN
100.0000 mL | Freq: Once | INTRAMUSCULAR | Status: AC | PRN
  Administered 2022-12-15: 100 mL via INTRAVENOUS

## 2022-12-20 NOTE — Progress Notes (Signed)
12/20/22 Called patient to discuss results of her CT scan.  Overall the appendiceal inflammatory changes compared to her last CT a month ago are much improved.  Her RLQ pain is much better now, with only some "twinges" here and there.  She's still having right hip discomfort.  Unclear if this is related to any inflammation from the appendix, but also recommended getting evaluated by ortho.  Patient has follow up with me in 1 month for H&P update for interval appendectomy.  Henrene Dodge, MD

## 2022-12-20 NOTE — Progress Notes (Signed)
[address] [place] [address] Dept: [phone]  FOLLOW UP NOTE  Patient ID: Denise Reyes, female    DOB: [birth year]  Age: 57 y.o. MRN: [id number] Date of Office Visit: 12/21/2022  Assessment  Chief Complaint: Asthma and Cough (Constant cough)  HPI Denise Reyes is a 57 year old female who presents to the clinic for follow-up visit.  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  At today's visit, she reports her asthma is moderately well-controlled with the symptoms including shortness of breath with activity while talking, intermittent wheeze, and cough producing mucus that has been present for the last several years.  She continues Trelegy 200-1 puff once a day and rarely uses albuterol for relief of symptoms.  Allergic rhinitis is reported as well-controlled with infrequent symptoms including occasional clear rhinorrhea and nasal congestion.  She continues cetirizine or cetirizine D as needed with relief of symptoms.  She continues nasal saline rinses and occasionally uses nasal saline gel and Flonase.Her last environmental allergy testing was on 12/18/2019 was positive to grass pollen and dog.  Reflux is reported as moderately well-controlled with symptoms worsening over the last week with abdominal changes.  She continues omeprazole 20 mg once a day and occasionally takes omeprazole 20 mg twice a day.  She is scheduled for robotic laparoscopic appendectomy on 02/06/2023.  She reports that she has had an increase in situational stress and has increased smoking to around 2 packs a day.  Her current medications are listed in the chart.  Drug Allergies:  No Known Allergies  Physical Exam: BP 130/82   Pulse (!) 103   Temp 98.3 F (36.8 C)   Resp 16   Ht 4' 11.5" (1.511 m)   Wt 142 lb 6.4 oz (64.6 kg)   SpO2 98%   BMI 28.28 kg/m    Physical Exam Vitals  reviewed.  Constitutional:      Appearance: Normal appearance.  HENT:     Head: Normocephalic and atraumatic.     Right Ear: Tympanic membrane normal.     Left Ear: Tympanic membrane normal.     Nose:     Comments: Bilateral naris slightly erythematous with clear nasal drainage noted.  Pharynx normal.  Ears normal.  Eyes normal.    Mouth/Throat:     Pharynx: Oropharynx is clear.  Eyes:     Conjunctiva/sclera: Conjunctivae normal.  Cardiovascular:     Rate and Rhythm: Normal rate and regular rhythm.     Heart sounds: Normal heart sounds. No murmur heard. Pulmonary:     Effort: Pulmonary effort is normal.     Breath sounds: Normal breath sounds.     Comments: Lungs clear to auscultation.  Patient with productive cough throughout the visit. Musculoskeletal:        General: Normal range of motion.     Cervical back: Normal range of motion and neck supple.  Skin:    General: Skin is warm and dry.  Neurological:     Mental Status: She is alert and oriented to person, place, and time.  Psychiatric:        Mood and Affect: Mood normal.        Behavior: Behavior normal.        Thought Content: Thought content normal.        Judgment: Judgment normal.     Diagnostics: FVC 2.43 which is 89%  of predicted value.  FEV1 1.72 which is 78.9% of predicted value.  Spirometry indicates normal ventilatory function.  Assessment and Plan: 1. 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 Seasonal and perennial allergic rhinoconjunctivitis   3. Tobacco user   4. Gastroesophageal reflux disease, unspecified whether esophagitis present     Meds ordered this encounter  Medications   omeprazole (PRILOSEC) 20 MG capsule    Sig: Take 1 capsule (20 mg total) by mouth daily.    Dispense:  30 capsule    Refill:  5    Patient will call when ready to refill   TRELEGY ELLIPTA 200-62.5-25 MCG/ACT AEPB    Sig: INHALE 1 PUFF INTO THE LUNGS DAILY. RINSE MOUTH AFTER USE    Dispense:  60  each    Refill:  5    Patient will call when ready to refill    Patient Instructions  Asthma COPD overlap Continue Trelegy 200-1 puff once a day to prevent cough or wheeze Continue albuterol 2 puffs once every 4 hours as needed for cough or wheeze You may use albuterol 2 puffs 5 to 15 minutes before activity to decrease cough or wheeze  Allergic rhinitis Continue allergen avoidance measures directed toward grass pollen and dog as listed below Continue an over-the-counter antihistamine once a day as needed for runny nose or itch Continue Dymista 2 sprays in each nostril twice a day as needed for nasal symptoms.aasal Consider saline nasal rinses as needed for nasal symptoms. Use this before any medicated nasal sprays for best result Continue saline nasal gel as needed for dry nostrils  Reflux Continue dietary and lifestyle modifications as listed below Continue omeprazole 20 mg once a day for reflux control. Take this medication 30 minutes before your first meal for best results  Tobacco use Continue to cut down on smoking and best to quit  Call the clinic if this treatment plan is not working well for you  Follow up in 6 months or sooner if needed.   Return in about 6 months (around 06/23/2023), or if symptoms worsen or fail to improve.    Thank you for the opportunity to care for this patient.  Please do not hesitate to contact me with questions.  Thermon Leyland, FNP [facility]

## 2022-12-20 NOTE — Patient Instructions (Incomplete)
Asthma COPD overlap Continue Trelegy 200-1 puff once a day to prevent cough or wheeze Continue albuterol 2 puffs once every 4 hours as needed for cough or wheeze You may use albuterol 2 puffs 5 to 15 minutes before activity to decrease cough or wheeze  Allergic rhinitis Continue allergen avoidance measures directed toward grass pollen and dog as listed below Continue an over-the-counter antihistamine once a day as needed for runny nose or itch Continue Dymista 2 sprays in each nostril twice a day as needed for nasal symptoms.aasal Consider saline nasal rinses as needed for nasal symptoms. Use this before any medicated nasal sprays for best result Begin saline nasal gel as needed for dry nostrils  Reflux Continue dietary and lifestyle modifications as listed below Continue omeprazole 20 mg once a day for reflux control. Take this medication 30 minutes before your first meal for best results  Tobacco use Continue to cut down on smoking and best to quit  Call the clinic if this treatment plan is not working well for you  Follow up in 6 months or sooner if needed.  Reducing Pollen Exposure The American Academy of All[employer], Asthma and Immunology suggests the following steps to reduce your exposure to pollen during allergy seasons. Do not hang sheets or clothing out to dry; pollen may collect on these items. Do not mow lawns or spend time around freshly cut grass; mowing stirs up pollen. Keep windows closed at night.  Keep car windows closed while driving. Minimize morning activities outdoors, a time when pollen counts are usually at their highest. Stay indoors as much as possible when pollen counts or humidity is high and on windy days when pollen tends to remain in the air longer. Use air conditioning when possible.  Many air conditioners have filters that trap the pollen spores. Use a HEPA room air filter to remove pollen form the indoor air you breathe.  Control of Dog or Cat  Allergen Avoidance is the best way to manage a dog or cat allergy. If you have a dog or cat and are allergic to dog or cats, consider removing the dog or cat from the home. If you have a dog or cat but don't want to find it a new home, or if your family wants a pet even though someone in the household is allergic, here are some strategies that may help keep symptoms at bay:  Keep the pet out of your bedroom and restrict it to only a few rooms. Be advised that keeping the dog or cat in only one room will not limit the allergens to that room. Don't pet, hug or kiss the dog or cat; if you do, wash your hands with soap and water. High-efficiency particulate air (HEPA) cleaners run continuously in a bedroom or living room can reduce allergen levels over time. Regular use of a high-efficiency vacuum cleaner or a central vacuum can reduce allergen levels. Giving your dog or cat a bath at least once a week can reduce airborne allergen.  

## 2022-12-21 ENCOUNTER — Encounter: Payer: Self-pay | Admitting: Family Medicine

## 2022-12-21 ENCOUNTER — Ambulatory Visit (INDEPENDENT_AMBULATORY_CARE_PROVIDER_SITE_OTHER): Payer: 59 | Admitting: Family Medicine

## 2022-12-21 VITALS — BP 130/82 | HR 103 | Temp 98.3°F | Resp 16 | Ht 59.5 in | Wt 142.4 lb

## 2022-12-21 DIAGNOSIS — J3089 Other allergic rhinitis: Secondary | ICD-10-CM | POA: Diagnosis not present

## 2022-12-21 DIAGNOSIS — H1010 Acute atopic conjunctivitis, unspecified eye: Secondary | ICD-10-CM

## 2022-12-21 DIAGNOSIS — J4489 Other specified chronic obstructive pulmonary disease: Secondary | ICD-10-CM | POA: Diagnosis not present

## 2022-12-21 DIAGNOSIS — H1013 Acute atopic conjunctivitis, bilateral: Secondary | ICD-10-CM

## 2022-12-21 DIAGNOSIS — K219 Gastro-esophageal reflux disease without esophagitis: Secondary | ICD-10-CM

## 2022-12-21 DIAGNOSIS — Z720 Tobacco use: Secondary | ICD-10-CM

## 2022-12-21 DIAGNOSIS — J302 Other seasonal allergic rhinitis: Secondary | ICD-10-CM | POA: Diagnosis not present

## 2022-12-21 MED ORDER — TRELEGY ELLIPTA 200-62.5-25 MCG/ACT IN AEPB: INHALATION_SPRAY | RESPIRATORY_TRACT | 5 refills | Status: DC

## 2022-12-21 MED ORDER — OMEPRAZOLE 20 MG PO CPDR: 20.0000 mg | DELAYED_RELEASE_CAPSULE | Freq: Every day | ORAL | 5 refills | Status: DC

## 2023-01-19 ENCOUNTER — Ambulatory Visit (INDEPENDENT_AMBULATORY_CARE_PROVIDER_SITE_OTHER): Payer: 59 | Admitting: Surgery

## 2023-01-19 ENCOUNTER — Encounter: Payer: Self-pay | Admitting: Surgery

## 2023-01-19 VITALS — BP 113/73 | HR 116 | Temp 98.0°F | Ht 59.5 in | Wt 140.2 lb

## 2023-01-19 DIAGNOSIS — K3580 Unspecified acute appendicitis: Secondary | ICD-10-CM

## 2023-01-19 NOTE — Progress Notes (Signed)
01/19/2023  History of Present Illness: Denise Reyes is a 57 y.o. female with a history of acute appendicitis presenting for follow-up and H&P update.  The patient is scheduled for robotic appendectomy on 02/06/2023.  She was last seen in the office on 12/06/2022 at which time her appendicitis has been improving on its own.  We did give her a course of Augmentin empirically and repeat a CT scan on 12/15/2022 which showed improving inflammatory changes.  Overall the patient reports today that she continues to feel well although she still having some pain that goes to the right hip as well as what she describes as twinges in the right lower quadrant that happen intermittently.  Denies any worsening pain, nausea, vomiting.  Past Medical History: Past Medical History:  Diagnosis Date   Allergy    PT CURRENTLY TAKING ABX AND PREDNISONE GIVEN BY DR Jenne Campus ON 01-16-18 FOR INFECTION THAT SHE GETS THIS TIME EVERY YEAR   Anxiety    Arthritis    knees, wrists   Asthma    allergy induced    Breast cancer (HCC)    Cancer (HCC)    GERD (gastroesophageal reflux disease)    occ-no meds   History of kidney stones    h/o   Personal history of radiation therapy 2019   LEFT lumpectomy   Problems with swallowing and mastication    Psoriasis    Recurrent upper respiratory infection (URI)    Special screening for malignant neoplasms, colon    Wears contact lenses      Past Surgical History: Past Surgical History:  Procedure Laterality Date   BILATERAL SALPINGOOPHORECTOMY Bilateral 12/30/2013   BREAST BIOPSY Left    DUCTAL CARCINOMA IN SITU (DCIS), LOW NUCLEAR GRADE.   BREAST BIOPSY Left 01/23/2018   Procedure: BREAST BIOPSY/PARTIAL MASTECTOMY WITH NEEDLE LOCALIZATION;  Surgeon: Lattie Haw, MD;  Location: [facility];  Service: General;  Laterality: Left;   BREAST EXCISIONAL BIOPSY Left 01/23/2018   lumectomy with NL    BREAST LUMPECTOMY Left 12/2017   DUCTAL CARCINOMA IN SITU   COLONOSCOPY WITH  PROPOFOL N/A 07/07/2016   Procedure: COLONOSCOPY WITH PROPOFOL;  Surgeon: Midge Minium, MD;  Location: [facility];  Service: Endoscopy;  Laterality: N/A;   ESOPHAGOGASTRODUODENOSCOPY (EGD) WITH PROPOFOL N/A 07/07/2016   Procedure: ESOPHAGOGASTRODUODENOSCOPY (EGD) WITH PROPOFOL;  Surgeon: Midge Minium, MD;  Location: [facility];  Service: Endoscopy;  Laterality: N/A;   LAPAROSCOPIC ASSISTED VAGINAL HYSTERECTOMY  12/30/2013   URETHRA SURGERY     14 months old    Home Medications: Prior to Admission medications   Medication Sig Start Date End Date Taking? Authorizing Provider  albuterol (VENTOLIN HFA) 108 (90 Base) MCG/ACT inhaler Inhale 2 puffs into the lungs every 4 (four) hours as needed for wheezing or shortness of breath (coughing, chest tightness). 07/14/22  Yes Ambs, Norvel Richards, FNP  atorvastatin (LIPITOR) 20 MG tablet Take 20 mg by mouth every evening.   Yes [provider]  Azelastine-Fluticasone 137-50 MCG/ACT SUSP Place 1 spray into the nose in the morning and at bedtime. For nasal symptoms. 07/14/22  Yes Ambs, Norvel Richards, FNP  calcium-vitamin D (OSCAL WITH D) 500-200 MG-UNIT tablet Take 1 tablet by mouth daily with breakfast.   Yes [provider]  cyanocobalamin 1000 MCG tablet Take 1,000 mcg by mouth daily.   Yes [provider]  letrozole (FEMARA) 2.5 MG tablet Take 1 tablet (2.5 mg total) by mouth daily. 03/14/22  Yes Rickard Patience, MD  Omega-3 Fatty Acids (OMEGA-3 FISH OIL PO) Take by mouth daily.   Yes [provider]  omeprazole (PRILOSEC) 20 MG capsule Take 1 capsule (20 mg total) by mouth daily. 12/21/22  Yes Ambs, Norvel Richards, FNP  simethicone (GAS-X) 80 MG chewable tablet Chew 1 tablet (80 mg total) by mouth 4 (four) times daily as needed for flatulence (gas pain, abdominal cramping, or bloating). 11/13/22 11/13/23 Yes Merwyn Katos, MD  TRELEGY ELLIPTA 200-62.5-25 MCG/ACT AEPB INHALE 1 PUFF INTO THE LUNGS DAILY. RINSE MOUTH AFTER USE 12/21/22   Yes Ambs, Norvel Richards, FNP  vitamin C (ASCORBIC ACID) 500 MG tablet Take 500 mg by mouth daily.   Yes [provider]    Allergies: No Known Allergies  Review of Systems: Review of Systems  Constitutional:  Negative for chills and fever.  Respiratory:  Negative for shortness of breath.   Cardiovascular:  Negative for chest pain.  Gastrointestinal:  Positive for abdominal pain. Negative for nausea and vomiting.    Physical Exam BP 113/73   Pulse (!) 116   Temp 98 F (36.7 C) (Oral)   Ht 4' 11.5" (1.511 m)   Wt 140 lb 3.2 oz (63.6 kg)   SpO2 95%   BMI 27.84 kg/m  CONSTITUTIONAL: No acute distress, well-nourished HEENT:  Normocephalic, atraumatic, extraocular motion intact. RESPIRATORY:  Lungs are clear, and breath sounds are equal bilaterally. Normal respiratory effort without pathologic use of accessory muscles. CARDIOVASCULAR: Heart is regular without murmurs, gallops, or rubs. GI: The abdomen is soft, nondistended, with mild discomfort in the right lower quadrant to deep palpation.  No peritonitis.  NEUROLOGIC:  Motor and sensation is grossly normal.  Cranial nerves are grossly intact. PSYCH:  Alert and oriented to person, place and time. Affect is normal.  Labs/Imaging: CT abdomen and pelvis on 12/15/2022: IMPRESSION: 1. Mild residual inflammatory changes of the distal appendix, significantly improved since the prior CT. No abscess. 2. No bowel obstruction. 3. Stable hepatic lesions. 4.  Aortic Atherosclerosis (ICD10-I70.0).  Assessment and Plan: This is a 57 y.o. female with a history of acute appendicitis.  - Patient is scheduled for robotic assisted appendectomy on 02/06/2023.  Discussed with her the surgery at length including the planned incisions, risks of bleeding, infection, injury to surrounding structures, that this would be an outpatient procedure, postoperative activity restrictions, pain control, and she is willing to proceed. - Return precautions given.   All of her questions have been answered.  I spent 20 minutes dedicated to the care of this patient on the date of this encounter to include pre-visit review of records, face-to-face time with the patient discussing diagnosis and management, and any post-visit coordination of care.   Howie Ill, MD [facility]

## 2023-01-19 NOTE — H&P (View-Only) (Signed)
01/19/2023  History of Present Illness: Denise Reyes is a 57 y.o. female with a history of acute appendicitis presenting for follow-up and H&P update.  The patient is scheduled for robotic appendectomy on 02/06/2023.  She was last seen in the office on 12/06/2022 at which time her appendicitis has been improving on its own.  We did give her a course of Augmentin empirically and repeat a CT scan on 12/15/2022 which showed improving inflammatory changes.  Overall the patient reports today that she continues to feel well although she still having some pain that goes to the right hip as well as what she describes as twinges in the right lower quadrant that happen intermittently.  Denies any worsening pain, nausea, vomiting.  Past Medical History: Past Medical History:  Diagnosis Date   Allergy    PT CURRENTLY TAKING ABX AND PREDNISONE GIVEN BY DR MCQUEEN ON 01-16-18 FOR INFECTION THAT SHE GETS THIS TIME EVERY YEAR   Anxiety    Arthritis    knees, wrists   Asthma    allergy induced    Breast cancer (HCC)    Cancer (HCC)    GERD (gastroesophageal reflux disease)    occ-no meds   History of kidney stones    h/o   Personal history of radiation therapy 2019   LEFT lumpectomy   Problems with swallowing and mastication    Psoriasis    Recurrent upper respiratory infection (URI)    Special screening for malignant neoplasms, colon    Wears contact lenses      Past Surgical History: Past Surgical History:  Procedure Laterality Date   BILATERAL SALPINGOOPHORECTOMY Bilateral 12/30/2013   BREAST BIOPSY Left    DUCTAL CARCINOMA IN SITU (DCIS), LOW NUCLEAR GRADE.   BREAST BIOPSY Left 01/23/2018   Procedure: BREAST BIOPSY/PARTIAL MASTECTOMY WITH NEEDLE LOCALIZATION;  Surgeon: Cooper, Richard E, MD;  Location: [facility];  Service: General;  Laterality: Left;   BREAST EXCISIONAL BIOPSY Left 01/23/2018   lumectomy with NL    BREAST LUMPECTOMY Left 12/2017   DUCTAL CARCINOMA IN SITU   COLONOSCOPY WITH  PROPOFOL N/A 07/07/2016   Procedure: COLONOSCOPY WITH PROPOFOL;  Surgeon: Darren Wohl, MD;  Location: [facility];  Service: Endoscopy;  Laterality: N/A;   ESOPHAGOGASTRODUODENOSCOPY (EGD) WITH PROPOFOL N/A 07/07/2016   Procedure: ESOPHAGOGASTRODUODENOSCOPY (EGD) WITH PROPOFOL;  Surgeon: Darren Wohl, MD;  Location: [facility];  Service: Endoscopy;  Laterality: N/A;   LAPAROSCOPIC ASSISTED VAGINAL HYSTERECTOMY  12/30/2013   URETHRA SURGERY     14 months old    Home Medications: Prior to Admission medications   Medication Sig Start Date End Date Taking? Authorizing Provider  albuterol (VENTOLIN HFA) 108 (90 Base) MCG/ACT inhaler Inhale 2 puffs into the lungs every 4 (four) hours as needed for wheezing or shortness of breath (coughing, chest tightness). 07/14/22  Yes Ambs, Anne M, FNP  atorvastatin (LIPITOR) 20 MG tablet Take 20 mg by mouth every evening.   Yes [provider]  Azelastine-Fluticasone 137-50 MCG/ACT SUSP Place 1 spray into the nose in the morning and at bedtime. For nasal symptoms. 07/14/22  Yes Ambs, Anne M, FNP  calcium-vitamin D (OSCAL WITH D) 500-200 MG-UNIT tablet Take 1 tablet by mouth daily with breakfast.   Yes [provider]  cyanocobalamin 1000 MCG tablet Take 1,000 mcg by mouth daily.   Yes [provider]  letrozole (FEMARA) 2.5 MG tablet Take 1 tablet (2.5 mg total) by mouth daily. 03/14/22  Yes Yu, Zhou, MD    Omega-3 Fatty Acids (OMEGA-3 FISH OIL PO) Take by mouth daily.   Yes [provider]  omeprazole (PRILOSEC) 20 MG capsule Take 1 capsule (20 mg total) by mouth daily. 12/21/22  Yes Ambs, Anne M, FNP  simethicone (GAS-X) 80 MG chewable tablet Chew 1 tablet (80 mg total) by mouth 4 (four) times daily as needed for flatulence (gas pain, abdominal cramping, or bloating). 11/13/22 11/13/23 Yes Bradler, Evan K, MD  TRELEGY ELLIPTA 200-62.5-25 MCG/ACT AEPB INHALE 1 PUFF INTO THE LUNGS DAILY. RINSE MOUTH AFTER USE 12/21/22   Yes Ambs, Anne M, FNP  vitamin C (ASCORBIC ACID) 500 MG tablet Take 500 mg by mouth daily.   Yes [provider]    Allergies: No Known Allergies  Review of Systems: Review of Systems  Constitutional:  Negative for chills and fever.  Respiratory:  Negative for shortness of breath.   Cardiovascular:  Negative for chest pain.  Gastrointestinal:  Positive for abdominal pain. Negative for nausea and vomiting.    Physical Exam BP 113/73   Pulse (!) 116   Temp 98 F (36.7 C) (Oral)   Ht 4' 11.5" (1.511 m)   Wt 140 lb 3.2 oz (63.6 kg)   SpO2 95%   BMI 27.84 kg/m  CONSTITUTIONAL: No acute distress, well-nourished HEENT:  Normocephalic, atraumatic, extraocular motion intact. RESPIRATORY:  Lungs are clear, and breath sounds are equal bilaterally. Normal respiratory effort without pathologic use of accessory muscles. CARDIOVASCULAR: Heart is regular without murmurs, gallops, or rubs. GI: The abdomen is soft, nondistended, with mild discomfort in the right lower quadrant to deep palpation.  No peritonitis.  NEUROLOGIC:  Motor and sensation is grossly normal.  Cranial nerves are grossly intact. PSYCH:  Alert and oriented to person, place and time. Affect is normal.  Labs/Imaging: CT abdomen and pelvis on 12/15/2022: IMPRESSION: 1. Mild residual inflammatory changes of the distal appendix, significantly improved since the prior CT. No abscess. 2. No bowel obstruction. 3. Stable hepatic lesions. 4.  Aortic Atherosclerosis (ICD10-I70.0).  Assessment and Plan: This is a 57 y.o. female with a history of acute appendicitis.  - Patient is scheduled for robotic assisted appendectomy on 02/06/2023.  Discussed with her the surgery at length including the planned incisions, risks of bleeding, infection, injury to surrounding structures, that this would be an outpatient procedure, postoperative activity restrictions, pain control, and she is willing to proceed. - Return precautions given.   All of her questions have been answered.  I spent 20 minutes dedicated to the care of this patient on the date of this encounter to include pre-visit review of records, face-to-face time with the patient discussing diagnosis and management, and any post-visit coordination of care.   Landon Bassford Luis Madisen Ludvigsen, MD [facility]     

## 2023-01-19 NOTE — Patient Instructions (Signed)
Laparoscopic Appendectomy, Adult  A laparoscopic appendectomy is a surgery to take out the appendix. The appendix is a finger-like structure that is attached to the large intestine. This procedure may be done to prevent an inflamed appendix from bursting (rupturing). It may also be done to treat the infection from an appendix that has ruptured. It is often done right after appendicitis is diagnosed. Appendicitis is inflammation of the appendix. In this surgery, your health care provider uses a thin, lighted tube with a camera (laparoscope) to take out the appendix through three small incisions. This is a minimally invasive surgery. It usually results in less pain, fewer problems, and a quicker recovery than surgery done through a large incision (open appendectomy). Tell a health care provider about: Any allergies you have. All medicines you are taking, including vitamins, herbs, eye drops, creams, and over-the-counter medicines. Any steroid use. This includes creams or steroids you take by mouth. Any problems you or family members have had with anesthetic medicines. Any bleeding problems you have. Any surgeries you have had. Any medical conditions you have. Whether you are pregnant or may be pregnant. What are the risks? Generally, this is a safe procedure. However, problems may occur, including: Infection. Bleeding. Damage to nearby structures or organs. Allergic reactions to medicines. A collection of pus (abscess). Blood clots in the legs. What happens before the procedure? When to stop eating and drinking Follow instructions from your health care provider about eating and drinking restrictions. You may be asked not to eat or drink as soon as the diagnosis of appendicitis is made. Medicines Ask your health care provider about: Changing or stopping your regular medicines. This is especially important if you are taking diabetes medicines or blood thinners. Taking medicines such as aspirin  and ibuprofen. These medicines can thin your blood. Do not take these medicines unless your health care provider tells you to take them. Taking over-the-counter medicines, vitamins, herbs, and supplements. General instructions If you will be going home right after the procedure, plan to have a responsible adult: Take you home from the hospital or clinic. You will not be allowed to drive. Care for you for the time you are told. Ask your health care provider: How your surgery site will be marked. What steps will be taken to help prevent infection. These steps may include: Removing hair at the surgery site. Washing skin with a germ-killing soap. Taking antibiotic medicine. What happens during the procedure?  An IV will be inserted into one of your veins. You will be given one or more of the following: A medicine to help you relax (sedative). A medicine to numb the area (local anesthetic). A medicine to make you fall asleep (general anesthetic). A thin, flexible tube (catheter) may be put into your bladder to drain urine. A tube may be passed through your nose or mouth and into your stomach (orogastric or nasogastric tube) to drain any stomach contents. Your surgeon will make three small incisions near your belly button (navel). A gas (carbon dioxide) will be used to fill your abdomen. The gas will make your abdomen expand. This helps the surgeon see clearly and gives him or her more room to work. A laparoscope will be passed through one of the incisions. Other surgical instruments will be passed through the other incisions to assist in surgery. The appendix will be located and removed through one of the incisions. The abdomen may be washed out to remove bacteria. The incisions will be closed with stitches (sutures),  staples, or adhesive strips. A bandage (dressing) may be used to cover the incisions. If a tube was inserted into your bladder or stomach, it will be removed. The procedure may  vary among health care providers and hospitals. What happens after the procedure? Your blood pressure, heart rate, breathing rate, and blood oxygen level will be monitored until you leave the hospital or clinic. You will be given medicines as needed to control pain and infection. If you were given a sedative during the procedure, it can affect you for several hours. Do not drive or operate machinery until your health care provider says that it is safe. If your appendix did not rupture, you may be able to go home the same day after your surgery. If your appendix ruptured: You will get antibiotic medicine through an IV line. You may be sent home with a temporary drain. Summary A laparoscopic appendectomy is a surgery to take out the appendix. The appendix is removed through three small incisions with the help of a thin, lighted tube that has a camera (laparoscope). This is a safe procedure, but there are some risks. Risks include bleeding, infection, allergic reaction to medicines, or damage to nearby organs. After the procedure, your blood pressure, heart rate, breathing rate, and blood oxygen level will be monitored until you leave the hospital or clinic. You will be given medicines as needed to control pain and infection. This information is not intended to replace advice given to you by your health care provider. Make sure you discuss any questions you have with your health care provider. Document Revised: 04/28/2021 Document Reviewed: 04/28/2021 Elsevier Patient Education  2024 [employer].

## 2023-01-29 ENCOUNTER — Encounter
Admission: RE | Admit: 2023-01-29 | Discharge: 2023-01-29 | Disposition: A | Discharge status: Home / Self Care | Payer: 59 | Source: Ambulatory Visit | Attending: Surgery | Admitting: Surgery

## 2023-01-29 ENCOUNTER — Other Ambulatory Visit: Payer: Self-pay

## 2023-01-29 VITALS — Ht 59.75 in | Wt 140.0 lb

## 2023-01-29 DIAGNOSIS — J449 Chronic obstructive pulmonary disease, unspecified: Secondary | ICD-10-CM

## 2023-01-29 DIAGNOSIS — Z720 Tobacco use: Secondary | ICD-10-CM

## 2023-01-29 HISTORY — DX: Chronic obstructive pulmonary disease, unspecified: J44.9

## 2023-01-29 NOTE — Patient Instructions (Addendum)
Your procedure is scheduled on: Tuesday, July 9 Report to the Registration Desk on the 1st floor of the [facility] To find out your arrival time, please call [phone] between 1PM - 3PM on: Monday, July 8  If your arrival time is 6:00 am, do not arrive before that time as the [facility] entrance doors do not open until 6:00 am.  REMEMBER: Instructions that are not followed completely may result in serious medical risk, up to and including death; or upon the discretion of your surgeon and anesthesiologist your surgery may need to be rescheduled.  Do not eat food after midnight the night before surgery.  No gum chewing or hard candies.  You may however, drink CLEAR liquids up to 2 hours before you are scheduled to arrive for your surgery. Do not drink anything within 2 hours of your scheduled arrival time.  Clear liquids include: - water  - apple juice without pulp - gatorade (not RED colors) - black coffee or tea (Do NOT add milk or creamers to the coffee or tea) Do NOT drink anything that is not on this list.  One week prior to surgery: Stop Anti-inflammatories (NSAIDS) such as Advil, Aleve, Ibuprofen, Motrin, Naproxen, Naprosyn and Aspirin based products such as Excedrin, Goody's Powder, BC Powder. Stop ANY OVER THE COUNTER supplements until after surgery. You may however, continue to take Tylenol if needed for pain up until the day of surgery.  Continue taking all prescribed medications with the exception of the following: vitamin C (ASCORBIC ACID)   Omega-3 Fatty Acids (OMEGA-3 FISH OIL PO)  cyanocobalamin  calcium-vitamin D (OSCAL WITH D)   Follow recommendations from Cardiologist or PCP regarding stopping blood thinners.  TAKE ONLY THESE MEDICATIONS THE MORNING OF SURGERY WITH A SIP OF WATER:  albuterol (VENTOLIN HFA  atorvastatin (LIPITOR)  letrozole (FEMARA)  omeprazole (PRILOSEC)  TRELEGY ELLIPTA  Azelastine-Fluticasone or Zyrtec  Use inhalers on the day of  surgery and bring to the hospital.  No Alcohol for 24 hours before or after surgery.  No Smoking including e-cigarettes for 24 hours before surgery.  No chewable tobacco products for at least 6 hours before surgery.  No nicotine patches on the day of surgery.  Do not use any "recreational" drugs for at least a week (preferably 2 weeks) before your surgery.  Please be advised that the combination of cocaine and anesthesia may have negative outcomes, up to and including death. If you test positive for cocaine, your surgery will be cancelled.  On the morning of surgery brush your teeth with toothpaste and water, you may rinse your mouth with mouthwash if you wish. Do not swallow any toothpaste or mouthwash.  Use CHG Soap or wipes as directed on instruction sheet.  Do not wear jewelry, make-up, hairpins, clips or nail polish.  Do not wear lotions, powders, or perfumes.   Do not shave body hair from the neck down 48 hours before surgery.  Contact lenses, hearing aids and dentures may not be worn into surgery.  Do not bring valuables to the hospital. [facility] is not responsible for any missing/lost belongings or valuables.   Notify your doctor if there is any change in your medical condition (cold, fever, infection).  Wear comfortable clothing (specific to your surgery type) to the hospital.  After surgery, you can help prevent lung complications by doing breathing exercises.  Take deep breaths and cough every 1-2 hours.  When coughing or sneezing, hold a pillow firmly against your incision  with both hands. This is called "splinting." Doing this helps protect your incision. It also decreases belly discomfort.  If you are being discharged the day of surgery, you will not be allowed to drive home. You will need a responsible individual to drive you home and stay with you for 24 hours after surgery.   If you are taking public transportation, you will need to have a responsible  individual with you.  Please call the Pre-admissions Testing Dept. at [phone] if you have any questions about these instructions.  Surgery Visitation Policy:  Patients having surgery or a procedure may have two visitors.  Children under the age of 57 must have an adult with them who is not the patient.     Preparing for Surgery with CHLORHEXIDINE GLUCONATE (CHG) Soap  Chlorhexidine Gluconate (CHG) Soap  o An antiseptic cleaner that kills germs and bonds with the skin to continue killing germs even after washing  o Used for showering the night before surgery and morning of surgery  Before surgery, you can play an important role by reducing the number of germs on your skin.  CHG (Chlorhexidine gluconate) soap is an antiseptic cleanser which kills germs and bonds with the skin to continue killing germs even after washing.  Please do not use if you have an allergy to CHG or antibacterial soaps. If your skin becomes reddened/irritated stop using the CHG.  1. Shower the NIGHT BEFORE SURGERY and the MORNING OF SURGERY with CHG soap.  2. If you choose to wash your hair, wash your hair first as usual with your normal shampoo.  3. After shampooing, rinse your hair and body thoroughly to remove the shampoo.  4. Use CHG as you would any other liquid soap. You can apply CHG directly to the skin and wash gently with a scrungie or a clean washcloth.  5. Apply the CHG soap to your body only from the neck down. Do not use on open wounds or open sores. Avoid contact with your eyes, ears, mouth, and genitals (private parts). Wash face and genitals (private parts) with your normal soap.  6. Wash thoroughly, paying special attention to the area where your surgery will be performed.  7. Thoroughly rinse your body with warm water.  8. Do not shower/wash with your normal soap after using and rinsing off the CHG soap.  9. Pat yourself dry with a clean towel.  10. Wear clean pajamas to bed  the night before surgery.  12. Place clean sheets on your bed the night of your first shower and do not sleep with pets.  13. Shower again with the CHG soap on the day of surgery prior to arriving at the hospital.  14. Do not apply any deodorants/lotions/powders.  15. Please wear clean clothes to the hospital.

## 2023-01-31 ENCOUNTER — Encounter
Admission: RE | Admit: 2023-01-31 | Discharge: 2023-01-31 | Disposition: A | Discharge status: Home / Self Care | Payer: 59 | Source: Ambulatory Visit | Attending: Surgery | Admitting: Surgery

## 2023-01-31 DIAGNOSIS — Z01810 Encounter for preprocedural cardiovascular examination: Secondary | ICD-10-CM | POA: Insufficient documentation

## 2023-01-31 DIAGNOSIS — Z720 Tobacco use: Secondary | ICD-10-CM | POA: Diagnosis not present

## 2023-01-31 DIAGNOSIS — J449 Chronic obstructive pulmonary disease, unspecified: Secondary | ICD-10-CM | POA: Diagnosis not present

## 2023-02-06 ENCOUNTER — Encounter
Admission: RE | Disposition: A | Discharge status: Home / Self Care | Payer: Self-pay | Source: Home / Self Care | Attending: Surgery

## 2023-02-06 ENCOUNTER — Ambulatory Visit: Payer: 59 | Admitting: Certified Registered"

## 2023-02-06 ENCOUNTER — Other Ambulatory Visit: Payer: Self-pay

## 2023-02-06 ENCOUNTER — Ambulatory Visit
Admission: RE | Admit: 2023-02-06 | Discharge: 2023-02-06 | Disposition: A | Discharge status: Home / Self Care | Payer: 59 | Attending: Surgery | Admitting: Surgery

## 2023-02-06 ENCOUNTER — Encounter: Payer: Self-pay | Admitting: Surgery

## 2023-02-06 DIAGNOSIS — J4489 Other specified chronic obstructive pulmonary disease: Secondary | ICD-10-CM | POA: Insufficient documentation

## 2023-02-06 DIAGNOSIS — K219 Gastro-esophageal reflux disease without esophagitis: Secondary | ICD-10-CM | POA: Insufficient documentation

## 2023-02-06 DIAGNOSIS — F17210 Nicotine dependence, cigarettes, uncomplicated: Secondary | ICD-10-CM | POA: Insufficient documentation

## 2023-02-06 DIAGNOSIS — K3580 Unspecified acute appendicitis: Secondary | ICD-10-CM

## 2023-02-06 HISTORY — PX: XI ROBOTIC LAPAROSCOPIC ASSISTED APPENDECTOMY: SHX6877

## 2023-02-06 SURGERY — APPENDECTOMY, ROBOT-ASSISTED, LAPAROSCOPIC
Anesthesia: General

## 2023-02-06 MED ORDER — IPRATROPIUM-ALBUTEROL 0.5-2.5 (3) MG/3ML IN SOLN: 3.0000 mL | Freq: Once | RESPIRATORY_TRACT | Status: DC | PRN

## 2023-02-06 MED ORDER — BUPIVACAINE LIPOSOME 1.3 % IJ SUSP
INTRAMUSCULAR | Status: AC
  Filled 2023-02-06: qty 20

## 2023-02-06 MED ORDER — CHLORHEXIDINE GLUCONATE 0.12 % MT SOLN
15.0000 mL | Freq: Once | OROMUCOSAL | Status: AC
  Administered 2023-02-06: 15 mL via OROMUCOSAL

## 2023-02-06 MED ORDER — FENTANYL CITRATE (PF) 100 MCG/2ML IJ SOLN
INTRAMUSCULAR | Status: AC
  Filled 2023-02-06: qty 2

## 2023-02-06 MED ORDER — SUGAMMADEX SODIUM 200 MG/2ML IV SOLN
INTRAVENOUS | Status: DC | PRN
  Administered 2023-02-06: 200 mg via INTRAVENOUS

## 2023-02-06 MED ORDER — ONDANSETRON HCL 4 MG/2ML IJ SOLN: 4.0000 mg | Freq: Once | INTRAMUSCULAR | Status: DC | PRN

## 2023-02-06 MED ORDER — LACTATED RINGERS IV SOLN: INTRAVENOUS | Status: DC | PRN

## 2023-02-06 MED ORDER — PROPOFOL 10 MG/ML IV BOLUS
INTRAVENOUS | Status: AC
  Filled 2023-02-06: qty 40

## 2023-02-06 MED ORDER — CHLORHEXIDINE GLUCONATE CLOTH 2 % EX PADS: 6.0000 | MEDICATED_PAD | Freq: Once | CUTANEOUS | Status: DC

## 2023-02-06 MED ORDER — EPINEPHRINE PF 1 MG/ML IJ SOLN
INTRAMUSCULAR | Status: AC
  Filled 2023-02-06: qty 1

## 2023-02-06 MED ORDER — HYDROMORPHONE HCL 1 MG/ML IJ SOLN
INTRAMUSCULAR | Status: DC | PRN
  Administered 2023-02-06: .5 mg via INTRAVENOUS

## 2023-02-06 MED ORDER — 0.9 % SODIUM CHLORIDE (POUR BTL) OPTIME
TOPICAL | Status: DC | PRN
  Administered 2023-02-06: 500 mL

## 2023-02-06 MED ORDER — LIDOCAINE HCL (CARDIAC) PF 100 MG/5ML IV SOSY
PREFILLED_SYRINGE | INTRAVENOUS | Status: DC | PRN
  Administered 2023-02-06: 60 mg via INTRAVENOUS

## 2023-02-06 MED ORDER — ROCURONIUM BROMIDE 100 MG/10ML IV SOLN
INTRAVENOUS | Status: DC | PRN
  Administered 2023-02-06: 20 mg via INTRAVENOUS
  Administered 2023-02-06: 50 mg via INTRAVENOUS

## 2023-02-06 MED ORDER — ACETAMINOPHEN 10 MG/ML IV SOLN: 1000.0000 mg | Freq: Once | INTRAVENOUS | Status: DC | PRN

## 2023-02-06 MED ORDER — LACTATED RINGERS IV SOLN: INTRAVENOUS | Status: DC

## 2023-02-06 MED ORDER — ORAL CARE MOUTH RINSE: 15.0000 mL | Freq: Once | OROMUCOSAL | Status: AC

## 2023-02-06 MED ORDER — PHENYLEPHRINE HCL (PRESSORS) 10 MG/ML IV SOLN
INTRAVENOUS | Status: DC | PRN
  Administered 2023-02-06 (×2): 160 ug via INTRAVENOUS
  Administered 2023-02-06 (×2): 80 ug via INTRAVENOUS

## 2023-02-06 MED ORDER — OXYCODONE HCL 5 MG PO TABS: 5.0000 mg | ORAL_TABLET | Freq: Once | ORAL | Status: DC | PRN

## 2023-02-06 MED ORDER — PROPOFOL 1000 MG/100ML IV EMUL
INTRAVENOUS | Status: AC
  Filled 2023-02-06: qty 200

## 2023-02-06 MED ORDER — ONDANSETRON HCL 4 MG/2ML IJ SOLN
INTRAMUSCULAR | Status: DC | PRN
  Administered 2023-02-06: 4 mg via INTRAVENOUS

## 2023-02-06 MED ORDER — FENTANYL CITRATE (PF) 100 MCG/2ML IJ SOLN: 25.0000 ug | INTRAMUSCULAR | Status: DC | PRN

## 2023-02-06 MED ORDER — DEXAMETHASONE SODIUM PHOSPHATE 10 MG/ML IJ SOLN
INTRAMUSCULAR | Status: DC | PRN
  Administered 2023-02-06: 5 mg via INTRAVENOUS

## 2023-02-06 MED ORDER — OXYCODONE HCL 5 MG/5ML PO SOLN: 5.0000 mg | Freq: Once | ORAL | Status: DC | PRN

## 2023-02-06 MED ORDER — CEFAZOLIN SODIUM-DEXTROSE 2-4 GM/100ML-% IV SOLN
INTRAVENOUS | Status: AC
  Filled 2023-02-06: qty 100

## 2023-02-06 MED ORDER — ACETAMINOPHEN 500 MG PO TABS
1000.0000 mg | ORAL_TABLET | ORAL | Status: AC
  Administered 2023-02-06: 1000 mg via ORAL

## 2023-02-06 MED ORDER — HYDROMORPHONE HCL 1 MG/ML IJ SOLN
INTRAMUSCULAR | Status: AC
  Filled 2023-02-06: qty 1

## 2023-02-06 MED ORDER — BUPIVACAINE LIPOSOME 1.3 % IJ SUSP: 20.0000 mL | Freq: Once | INTRAMUSCULAR | Status: DC

## 2023-02-06 MED ORDER — MIDAZOLAM HCL 2 MG/2ML IJ SOLN
INTRAMUSCULAR | Status: DC | PRN
  Administered 2023-02-06: 2 mg via INTRAVENOUS

## 2023-02-06 MED ORDER — KETOROLAC TROMETHAMINE 30 MG/ML IJ SOLN
INTRAMUSCULAR | Status: DC | PRN
  Administered 2023-02-06: 30 mg via INTRAVENOUS

## 2023-02-06 MED ORDER — GABAPENTIN 300 MG PO CAPS
300.0000 mg | ORAL_CAPSULE | ORAL | Status: AC
  Administered 2023-02-06: 300 mg via ORAL

## 2023-02-06 MED ORDER — PHENYLEPHRINE HCL-NACL 20-0.9 MG/250ML-% IV SOLN
INTRAVENOUS | Status: AC
  Filled 2023-02-06: qty 250

## 2023-02-06 MED ORDER — IBUPROFEN 600 MG PO TABS
600.0000 mg | ORAL_TABLET | Freq: Three times a day (TID) | ORAL | 1 refills | Status: DC | PRN
Start: 2023-02-06 — End: 2024-02-13

## 2023-02-06 MED ORDER — GABAPENTIN 300 MG PO CAPS
ORAL_CAPSULE | ORAL | Status: AC
  Filled 2023-02-06: qty 1

## 2023-02-06 MED ORDER — SODIUM CHLORIDE 0.9 % IV SOLN: 2.0000 g | INTRAVENOUS | Status: DC

## 2023-02-06 MED ORDER — PROPOFOL 10 MG/ML IV BOLUS
INTRAVENOUS | Status: DC | PRN
  Administered 2023-02-06: 30 mg via INTRAVENOUS
  Administered 2023-02-06: 150 mg via INTRAVENOUS

## 2023-02-06 MED ORDER — FENTANYL CITRATE (PF) 100 MCG/2ML IJ SOLN
INTRAMUSCULAR | Status: DC | PRN
  Administered 2023-02-06 (×2): 50 ug via INTRAVENOUS

## 2023-02-06 MED ORDER — ACETAMINOPHEN 500 MG PO TABS
ORAL_TABLET | ORAL | Status: AC
  Filled 2023-02-06: qty 2

## 2023-02-06 MED ORDER — OXYCODONE HCL 5 MG PO TABS
5.0000 mg | ORAL_TABLET | ORAL | 0 refills | Status: DC | PRN
Start: 2023-02-06 — End: 2023-02-20

## 2023-02-06 MED ORDER — CEFAZOLIN SODIUM-DEXTROSE 2-3 GM-%(50ML) IV SOLR
INTRAVENOUS | Status: DC | PRN
  Administered 2023-02-06: 2 g via INTRAVENOUS

## 2023-02-06 MED ORDER — BUPIVACAINE HCL (PF) 0.5 % IJ SOLN
INTRAMUSCULAR | Status: AC
  Filled 2023-02-06: qty 30

## 2023-02-06 MED ORDER — BUPIVACAINE LIPOSOME 1.3 % IJ SUSP
INTRAMUSCULAR | Status: DC | PRN
  Administered 2023-02-06: 20 mL

## 2023-02-06 MED ORDER — BUPIVACAINE-EPINEPHRINE (PF) 0.5% -1:200000 IJ SOLN
INTRAMUSCULAR | Status: DC | PRN
  Administered 2023-02-06: 30 mL

## 2023-02-06 MED ORDER — MIDAZOLAM HCL 2 MG/2ML IJ SOLN
INTRAMUSCULAR | Status: AC
  Filled 2023-02-06: qty 2

## 2023-02-06 MED ORDER — CHLORHEXIDINE GLUCONATE 0.12 % MT SOLN
OROMUCOSAL | Status: AC
  Filled 2023-02-06: qty 15

## 2023-02-06 SURGICAL SUPPLY — 51 items
ADH SKN CLS APL DERMABOND .7 (GAUZE/BANDAGES/DRESSINGS) ×1
BAG PRESSURE INF REUSE 1000 (BAG) IMPLANT
BLADE SURG SZ11 CARB STEEL (BLADE) ×2 IMPLANT
CANNULA REDUCER 12-8 DVNC XI (CANNULA) ×2 IMPLANT
COVER WAND RF STERILE (DRAPES) ×2 IMPLANT
DERMABOND ADVANCED .7 DNX12 (GAUZE/BANDAGES/DRESSINGS) ×2 IMPLANT
DRAPE ARM DVNC X/XI (DISPOSABLE) ×6 IMPLANT
DRAPE COLUMN DVNC XI (DISPOSABLE) ×2 IMPLANT
ELECT REM PT RETURN 9FT ADLT (ELECTROSURGICAL) ×1
ELECTRODE REM PT RTRN 9FT ADLT (ELECTROSURGICAL) ×2 IMPLANT
FORCEPS BPLR R/ABLATION 8 DVNC (INSTRUMENTS) ×2 IMPLANT
GLOVE SURG SYN 7.0 (GLOVE) ×2 IMPLANT
GLOVE SURG SYN 7.0 PF PI (GLOVE) ×4 IMPLANT
GLOVE SURG SYN 7.5 E (GLOVE) ×2 IMPLANT
GLOVE SURG SYN 7.5 PF PI (GLOVE) ×4 IMPLANT
GOWN STRL REUS W/ TWL LRG LVL3 (GOWN DISPOSABLE) ×6 IMPLANT
GOWN STRL REUS W/TWL LRG LVL3 (GOWN DISPOSABLE) ×3
GRASPER SUT TROCAR 14GX15 (MISCELLANEOUS) IMPLANT
IRRIGATOR SUCT 8 DISP DVNC XI (IRRIGATION / IRRIGATOR) IMPLANT
IV NS 1000ML (IV SOLUTION)
IV NS 1000ML BAXH (IV SOLUTION) IMPLANT
KIT PINK PAD W/HEAD ARE REST (MISCELLANEOUS) ×1
KIT PINK PAD W/HEAD ARM REST (MISCELLANEOUS) ×2 IMPLANT
LABEL OR SOLS (LABEL) IMPLANT
MANIFOLD NEPTUNE II (INSTRUMENTS) ×2 IMPLANT
NDL HYPO 22X1.5 SAFETY MO (MISCELLANEOUS) ×2 IMPLANT
NDL INSUFFLATION 14GA 120MM (NEEDLE) ×2 IMPLANT
NEEDLE HYPO 22X1.5 SAFETY MO (MISCELLANEOUS) ×1 IMPLANT
NEEDLE INSUFFLATION 14GA 120MM (NEEDLE) ×1 IMPLANT
OBTURATOR OPTICAL STND 8 DVNC (TROCAR) ×1
OBTURATOR OPTICALSTD 8 DVNC (TROCAR) ×2 IMPLANT
PACK LAP CHOLECYSTECTOMY (MISCELLANEOUS) ×2 IMPLANT
PENCIL SMOKE EVACUATOR (MISCELLANEOUS) ×2 IMPLANT
RELOAD STAPLE 45 3.5 BLU DVNC (STAPLE) IMPLANT
RELOAD STAPLER 3.5X45 BLU DVNC (STAPLE) ×1 IMPLANT
SEAL UNIV 5-12 XI (MISCELLANEOUS) ×6 IMPLANT
SEALER VESSEL EXT DVNC XI (MISCELLANEOUS) ×2 IMPLANT
SET TUBE FILTERED XL AIRSEAL (SET/KITS/TRAYS/PACK) IMPLANT
SET TUBE SMOKE EVAC HIGH FLOW (TUBING) ×2 IMPLANT
SOL ELECTROSURG ANTI STICK (MISCELLANEOUS) ×1
SOLUTION ELECTROSURG ANTI STCK (MISCELLANEOUS) ×2 IMPLANT
STAPLER 45 SUREFORM DVNC (STAPLE) IMPLANT
STAPLER RELOAD 3.5X45 BLU DVNC (STAPLE) ×1
SUT MNCRL AB 4-0 PS2 18 (SUTURE) ×2 IMPLANT
SUT VIC AB 3-0 SH 27 (SUTURE) ×1
SUT VIC AB 3-0 SH 27X BRD (SUTURE) ×2 IMPLANT
SUT VICRYL 0 UR6 27IN ABS (SUTURE) ×4 IMPLANT
SYS BAG RETRIEVAL 10MM (BASKET) ×1
SYSTEM BAG RETRIEVAL 10MM (BASKET) ×2 IMPLANT
TRAY FOLEY MTR SLVR 16FR STAT (SET/KITS/TRAYS/PACK) ×2 IMPLANT
WATER STERILE IRR 500ML POUR (IV SOLUTION) ×2 IMPLANT

## 2023-02-06 NOTE — Anesthesia Postprocedure Evaluation (Signed)
Anesthesia Post Note  Patient: Denise Reyes  Procedure(s) Performed: XI ROBOTIC LAPAROSCOPIC ASSISTED APPENDECTOMY  Patient location during evaluation: PACU Anesthesia Type: General Level of consciousness: awake and alert, oriented and patient cooperative Pain management: pain level controlled Vital Signs Assessment: post-procedure vital signs reviewed and stable Respiratory status: spontaneous breathing, nonlabored ventilation and respiratory function stable Cardiovascular status: blood pressure returned to baseline and stable Postop Assessment: adequate PO intake Anesthetic complications: no   No notable events documented.   Last Vitals:  Vitals:   02/06/23 1000 02/06/23 1012  BP: 101/63 106/70  Pulse: 94 83  Resp: 13 15  Temp: (!) 36.1 C (!) 36.1 C  SpO2: 97% 98%    Last Pain:  Vitals:   02/06/23 1012  TempSrc: Temporal  PainSc: 0-No pain                 Reed Breech

## 2023-02-06 NOTE — Discharge Instructions (Addendum)
Discharge Instructions: 1.  Patient may shower, but do not scrub wounds heavily and dab dry only. 2.  Do not submerge wounds in pool/tub until fully healed. 3.  Do not apply ointments or hydrogen peroxide to the wounds. 4.  May apply ice packs to the wounds for comfort. 5.  Do not drive while taking narcotics for pain control.  Prior to driving, make sure you are able to rotate right and left to look at blindspots without significant pain or discomfort. 6.  No heavy lifting or pushing of more than 10-15 lbs for 4 weeks.   AMBULATORY SURGERY  DISCHARGE INSTRUCTIONS   The drugs that you were given will stay in your system until tomorrow so for the next 24 hours you should not:  Drive an automobile Make any legal decisions Drink any alcoholic beverage   You may resume regular meals tomorrow.  Today it is better to start with liquids and gradually work up to solid foods.  You may eat anything you prefer, but it is better to start with liquids, then soup and crackers, and gradually work up to solid foods.   Please notify your doctor immediately if you have any unusual bleeding, trouble breathing, redness and pain at the surgery site, drainage, fever, or pain not relieved by medication.    Additional Instructions:   Please contact your physician with any problems or Same Day Surgery at [phone], Monday through Friday 6 am to 4 pm, or [facility] [place] Main number at [phone].  

## 2023-02-06 NOTE — Interval H&P Note (Signed)
History and Physical Interval Note:  02/06/2023 7:05 AM  Denise Reyes  has presented today for surgery, with the diagnosis of acute appendicitis.  The various methods of treatment have been discussed with the patient and family. After consideration of risks, benefits and other options for treatment, the patient has consented to  Procedure(s): XI ROBOTIC LAPAROSCOPIC ASSISTED APPENDECTOMY (N/A) as a surgical intervention.  The patient's history has been reviewed, patient examined, no change in status, stable for surgery.  I have reviewed the patient's chart and labs.  Questions were answered to the patient's satisfaction.     Liberti Appleton

## 2023-02-06 NOTE — Anesthesia Procedure Notes (Signed)
Procedure Name: Intubation Date/Time: 02/06/2023 7:39 AM  Performed by: Merlene Pulling, CRNAPre-anesthesia Checklist: Patient identified, Patient being monitored, Timeout performed, Emergency Drugs available and Suction available Patient Re-evaluated:Patient Re-evaluated prior to induction Oxygen Delivery Method: Circle system utilized Preoxygenation: Pre-oxygenation with 100% oxygen Induction Type: IV induction Ventilation: Mask ventilation without difficulty Laryngoscope Size: 3 and McGraph Grade View: Grade I Tube type: Oral Tube size: 6.5 mm Number of attempts: 1 Airway Equipment and Method: Stylet Placement Confirmation: ETT inserted through vocal cords under direct vision, positive ETCO2 and breath sounds checked- equal and bilateral Secured at: 20 cm Tube secured with: Tape Dental Injury: Teeth and Oropharynx as per pre-operative assessment

## 2023-02-06 NOTE — Transfer of Care (Signed)
Immediate Anesthesia Transfer of Care Note  Patient: RUBAB LAUDANO  Procedure(s) Performed: XI ROBOTIC LAPAROSCOPIC ASSISTED APPENDECTOMY  Patient Location: PACU  Anesthesia Type:General  Level of Consciousness: drowsy  Airway & Oxygen Therapy: Patient Spontanous Breathing and Patient connected to face mask oxygen  Post-op Assessment: Report given to RN and Post -op Vital signs reviewed and stable  Post vital signs: Reviewed  Last Vitals:  Vitals Value Taken Time  BP 102/64 02/06/23 0920  Temp 74F   Pulse 88 02/06/23 0923  Resp 14 02/06/23 0923  SpO2 100 % 02/06/23 0923  Vitals shown include unvalidated device data.  Last Pain:  Vitals:   02/06/23 0634  TempSrc: Tympanic  PainSc: 0-No pain         Complications: No notable events documented.

## 2023-02-06 NOTE — Anesthesia Preprocedure Evaluation (Addendum)
Anesthesia Evaluation  Patient identified by MRN, date of birth, ID band Patient awake    Reviewed: Allergy & Precautions, NPO status , Patient's Chart, lab work & pertinent test results  History of Anesthesia Complications Negative for: history of anesthetic complications  Airway Mallampati: I   Neck ROM: Full    Dental no notable dental hx.    Pulmonary asthma , COPD, Current Smoker (1 ppd) and Patient abstained from smoking.   Pulmonary exam normal breath sounds clear to auscultation       Cardiovascular Normal cardiovascular exam Rhythm:Regular Rate:Normal  ECG 01/31/23: normal   Neuro/Psych  PSYCHIATRIC DISORDERS Anxiety     negative neurological ROS     GI/Hepatic ,GERD  ,,  Endo/Other  negative endocrine ROS    Renal/GU Renal disease (nephrolithiasis)     Musculoskeletal  (+) Arthritis ,    Abdominal   Peds  Hematology Breast CA   Anesthesia Other Findings   Reproductive/Obstetrics                             Anesthesia Physical Anesthesia Plan  ASA: 2  Anesthesia Plan: General   Post-op Pain Management:    Induction: Intravenous  PONV Risk Score and Plan: 2 and Ondansetron, Dexamethasone and Treatment may vary due to age or medical condition  Airway Management Planned: Oral ETT  Additional Equipment:   Intra-op Plan:   Post-operative Plan: Extubation in OR  Informed Consent: I have reviewed the patients History and Physical, chart, labs and discussed the procedure including the risks, benefits and alternatives for the proposed anesthesia with the patient or authorized representative who has indicated his/her understanding and acceptance.     Dental advisory given  Plan Discussed with: CRNA  Anesthesia Plan Comments: (Patient consented for risks of anesthesia including but not limited to:  - adverse reactions to medications - damage to eyes, teeth, lips or other  oral mucosa - nerve damage due to positioning  - sore throat or hoarseness - damage to heart, brain, nerves, lungs, other parts of body or loss of life  Informed patient about role of CRNA in peri- and intra-operative care.  Patient voiced understanding.)        Anesthesia Quick Evaluation

## 2023-02-06 NOTE — Op Note (Signed)
  Procedure Date:  02/06/2023  Pre-operative Diagnosis:  Acute appendicitis  Post-operative Diagnosis: Acute appendicitis  Procedure:  Robotic assisted Appendectomy  Surgeon:  Howie Ill, MD  Assistant:  Vanessa Ralphs, PA-S  Anesthesia:  General endotracheal  Estimated Blood Loss:  5 ml  Specimens:  Appendix  Complications:  None  Indications for Procedure:  This is a 57 y.o. female who presents with a history of acute appendicitis, treated medically.  The options of surgery versus observation were reviewed with the patient and/or family. The risks of bleeding, abscess or infection, potential for an open procedure, injury to surrounding structures were all discussed with the patient and was willing to proceed.  Description of Procedure: The patient was correctly identified in the preoperative area and brought into the operating room.  The patient was placed supine with VTE prophylaxis in place.  Appropriate time-outs were performed.  Anesthesia was induced and the patient was intubated.  Appropriate antibiotics were infused.  The abdomen was prepped and draped in a sterile fashion.  A Veress needle was introduced in the left upper quadrant and pneumoperitoneum was obtained with appropriate pressures.  Using Optiview technique, an 8 mm port was introduced in the left lateral abdominal wall without complications.  Then, a 12 mm port was introduced in the left upper quadrant and an 8 mm port in the left lower quadrant under direct visualization.  The DaVinci platform was docked, camera targeted, and instruments placed under direct visualization.  The patient's right lower quadrant was evaluated.  There were some adhesions from her prior appendicitis.  The appendix was visualized and carefully dissected.  Vessel Sealer was used to take the mesoappendix, and then a 45 mm blue load stapler was used to take the appendix at its base, without any injury to small bowel.  The appendix was  placed in an Endocatch bag and retrieved.  The DaVinci platform was then undocked and instruments removed.    40 ml of Exparel solution mixed with 0.5% bupivacaine with epi was infiltrated around the port sites.  The 12 mm port was removed and the fascia was closed under direct visualization utilizing an Endo Close technique with 0 Vicryl suture.  The 8 mm ports were removed. The 12 mm incision was closed using 3-0 Vicryl and 4-0 Monocryl, and the other port incisions were closed with 4-0 Monocryl.  The wounds were cleaned and sealed with DermaBond.  The patient was emerged from anesthesia and extubated and brought to the recovery room for further management.  The patient tolerated the procedure well and all counts were correct at the end of the case.   Howie Ill, MD

## 2023-02-07 ENCOUNTER — Encounter: Payer: Self-pay | Admitting: Surgery

## 2023-02-20 ENCOUNTER — Encounter: Payer: Self-pay | Admitting: Physician Assistant

## 2023-02-20 ENCOUNTER — Ambulatory Visit (INDEPENDENT_AMBULATORY_CARE_PROVIDER_SITE_OTHER): Payer: 59 | Admitting: Physician Assistant

## 2023-02-20 VITALS — BP 125/85 | HR 94 | Temp 98.3°F | Wt 140.4 lb

## 2023-02-20 DIAGNOSIS — Z09 Encounter for follow-up examination after completed treatment for conditions other than malignant neoplasm: Secondary | ICD-10-CM

## 2023-02-20 DIAGNOSIS — K3580 Unspecified acute appendicitis: Secondary | ICD-10-CM

## 2023-02-20 NOTE — Patient Instructions (Signed)

## 2023-02-20 NOTE — Progress Notes (Signed)
[facility] POST-OP OFFICE VISIT  02/20/2023  HPI: Denise Reyes is a 57 y.o. female 14 days s/p robotic assisted laparoscopic appendectomy with Dr Aleen Campi  She reports she has done very well No abdominal pain No feer, chills, nausea, emesis, or bowel changes Incisions are well healed Tolerating PO Ambulating without issues No other complaints   Vital signs: BP 125/85   Pulse 94   Temp 98.3 F (36.8 C) (Oral)   Wt 140 lb 6.4 oz (63.7 kg)   SpO2 98%   BMI 27.65 kg/m    Physical Exam: Constitutional: Well appearing female, NAD Abdomen: Soft, non-tender, non-distended, no rebound/guarding Skin: Laparoscopic incisions are healing well, no erythema or drainage   Assessment/Plan: This is a 57 y.o. female 14 days s/p robotic assisted laparoscopic appendectomy with Dr Aleen Campi   - Pain control prn  - Reviewed wound care recommendation  - Reviewed lifting restrictions; 4 weeks total  - Reviewed surgical pathology; treated appendicitis   - She can follow up on as needed basis; She understands to call with questions/concerns  -- Lynden Oxford, PA-C [facility] 02/20/2023, 2:04 PM M-F: 7am - 4pm

## 2023-03-08 ENCOUNTER — Other Ambulatory Visit: Payer: Self-pay

## 2023-03-08 MED ORDER — LETROZOLE 2.5 MG PO TABS: 2.5000 mg | ORAL_TABLET | Freq: Every day | ORAL | 1 refills | Status: DC

## 2023-03-15 ENCOUNTER — Other Ambulatory Visit: Payer: 59

## 2023-03-15 ENCOUNTER — Ambulatory Visit: Payer: 59

## 2023-03-15 ENCOUNTER — Ambulatory Visit: Payer: 59 | Admitting: Oncology

## 2023-03-22 ENCOUNTER — Inpatient Hospital Stay (HOSPITAL_BASED_OUTPATIENT_CLINIC_OR_DEPARTMENT_OTHER): Payer: 59 | Admitting: Oncology

## 2023-03-22 ENCOUNTER — Inpatient Hospital Stay: Payer: 59

## 2023-03-22 ENCOUNTER — Inpatient Hospital Stay: Payer: 59 | Attending: Oncology

## 2023-03-22 ENCOUNTER — Encounter: Payer: Self-pay | Admitting: Oncology

## 2023-03-22 VITALS — BP 128/83 | HR 93 | Temp 98.1°F | Resp 18 | Wt 144.6 lb

## 2023-03-22 DIAGNOSIS — D0512 Intraductal carcinoma in situ of left breast: Secondary | ICD-10-CM

## 2023-03-22 DIAGNOSIS — M8580 Other specified disorders of bone density and structure, unspecified site: Secondary | ICD-10-CM | POA: Diagnosis not present

## 2023-03-22 DIAGNOSIS — Z79899 Other long term (current) drug therapy: Secondary | ICD-10-CM | POA: Diagnosis not present

## 2023-03-22 DIAGNOSIS — F17210 Nicotine dependence, cigarettes, uncomplicated: Secondary | ICD-10-CM | POA: Insufficient documentation

## 2023-03-22 DIAGNOSIS — Z720 Tobacco use: Secondary | ICD-10-CM | POA: Diagnosis not present

## 2023-03-22 LAB — CMP (CANCER CENTER ONLY)
ALT: 22 U/L (ref 0–44)
AST: 22 U/L (ref 15–41)
Albumin: 3.8 g/dL (ref 3.5–5.0)
Alkaline Phosphatase: 55 U/L (ref 38–126)
Anion gap: 8 (ref 5–15)
BUN: 13 mg/dL (ref 6–20)
CO2: 25 mmol/L (ref 22–32)
Calcium: 9 mg/dL (ref 8.9–10.3)
Chloride: 103 mmol/L (ref 98–111)
Creatinine: 0.75 mg/dL (ref 0.44–1.00)
GFR, Estimated: >60 mL/min (ref >60)
Glucose, Bld: 102 mg/dL — ABNORMAL HIGH (ref 70–99)
Potassium: 4.2 mmol/L (ref 3.5–5.1)
Sodium: 136 mmol/L (ref 135–145)
Total Bilirubin: 0.6 mg/dL (ref 0.3–1.2)
Total Protein: 6.7 g/dL (ref 6.5–8.1)

## 2023-03-22 LAB — CBC WITH DIFFERENTIAL/PLATELET
Abs Immature Granulocytes: 0.02 10*3/uL (ref 0.00–0.07)
Basophils Absolute: 0.1 10*3/uL (ref 0.0–0.1)
Basophils Relative: 1 %
Eosinophils Absolute: 0.1 10*3/uL (ref 0.0–0.5)
Eosinophils Relative: 1 %
HCT: 40.1 % (ref 36.0–46.0)
Hemoglobin: 13.7 g/dL (ref 12.0–15.0)
Immature Granulocytes: 0 %
Lymphocytes Relative: 33 %
Lymphs Abs: 2.6 10*3/uL (ref 0.7–4.0)
MCH: 32.2 pg (ref 26.0–34.0)
MCHC: 34.2 g/dL (ref 30.0–36.0)
MCV: 94.4 fL (ref 80.0–100.0)
Monocytes Absolute: 0.7 10*3/uL (ref 0.1–1.0)
Monocytes Relative: 9 %
Neutro Abs: 4.5 10*3/uL (ref 1.7–7.7)
Neutrophils Relative %: 56 %
Platelets: 329 10*3/uL (ref 150–400)
RBC: 4.25 MIL/uL (ref 3.87–5.11)
RDW: 12.1 % (ref 11.5–15.5)
WBC: 7.9 10*3/uL (ref 4.0–10.5)
nRBC: 0 % (ref 0.0–0.2)

## 2023-03-22 MED ORDER — ZOLEDRONIC ACID 4 MG/100ML IV SOLN
4.0000 mg | Freq: Once | INTRAVENOUS | Status: AC
  Administered 2023-03-22: 4 mg via INTRAVENOUS
  Filled 2023-03-22: qty 100

## 2023-03-22 MED ORDER — SODIUM CHLORIDE 0.9 % IV SOLN
Freq: Once | INTRAVENOUS | Status: AC
  Filled 2023-03-22: qty 250

## 2023-03-22 NOTE — Assessment & Plan Note (Addendum)
Smoke cessation recommended  Patient has been up-to-date for screening.

## 2023-03-22 NOTE — Assessment & Plan Note (Signed)
#  Osteopenia in the context of chronic aromatase inhibitor use. Continue Zometa every 6 months Continue calcium and vitamin D supplementation. 03/2022 DEXA showed osteopenia.

## 2023-03-22 NOTE — Progress Notes (Signed)
Hematology/Oncology Progress note [phone] [phone]     REASON FOR VISIT Follow up for DCIS  ASSESSMENT & PLAN:   Ductal carcinoma in situ (DCIS) of left breast # DCIS s/p lumpectomy and adjuvant RT.  Currently on adjuvant endocrine therapy with letrozole.  She tolerates well with manageable side effects. Recommend patient to continue letrozole 2.5 mg daily.  Prescription sent to pharmacy. Duration of treatment total 5 years -till September 2024.recommend her to stop after that.  Patient has bilateral mammogram ordered through her gynecologist  Osteopenia #Osteopenia in the context of chronic aromatase inhibitor use. Continue Zometa every 6 months Continue calcium and vitamin D supplementation. 03/2022 DEXA showed osteopenia.   Tobacco user Smoke cessation recommended  Patient has been up-to-date for screening.   Orders Placed This Encounter  Procedures   CBC with Differential (Cancer Center Only)    Standing Status:   Future    Standing Expiration Date:   03/21/2024   CMP (Cancer Center only)    Standing Status:   Future    Standing Expiration Date:   03/21/2024   CBC with Differential (Cancer Center Only)    Standing Status:   Future    Standing Expiration Date:   03/21/2024   CMP (Cancer Center only)    Standing Status:   Future    Standing Expiration Date:   03/21/2024   Follow up in 6 months.  All questions were answered. The patient knows to call the clinic with any problems, questions or concerns.  Rickard Patience, MD, PhD [facility] Hematology Oncology 03/22/2023    HISTORY OF PRESENTING ILLNESS:  Patient had a mammogram done in May 2019.  Mammogram showed indeterminate calcifications in the upper outer quadrant of the left breast for which biopsy is indicated.  Patient is status post stereotactic guided core biopsy of the left breast calcifications. 12/26/2017 left breast biopsy upper outer quadrant showed DCIS, low nuclear grade, with back  ground of fibrocystic change, include sclerosing adenosis and calcifications, negative for invasive carcinoma.   Patient underwent left breast lumpectomy on 01/23/2018, pathology showed fibrocystic change with microcalcifications Patient was referred to oncology for further evaluation and management.   # Patient's case was discussed at breast tumor board on 02/11/2018. patient that although her DCIS foci is very small, her breast has very proliferative background, consistent with hormone replacement treatment.  Status post adjuvant RT completed in September 2019. Letrozole was started in September 2019   # Postmenopausal status: obtained patient's GYN operation report from Dr. Geanie Berlin office. Patient is in menopasue state as she had had bilateral salpingo-oophorectomy and hysterectomy on 12/30/2013. Pathology reviewed.  Showed benign essentially unremarkable cervix, benign proliferative endometrium associated with benign endometrial polyp.  Adenomyosis, leiomyomata, benign right ovary with small foci of endometriosis, benign right paratubal cyst essentially unremarkable left ovary and the fallopian tube.  Marland Kitchen #We obtained her records from GYN office and reviewed. DEXA done in May 2019 showed left femoral neck T score -1.1, AP spine -0.3, right femoral neck -1.1  #September 2019, started on letrozole  INTERVAL HISTORY Denise Reyes is a 57 y.o. female who has above history reviewed by me today presents for management of DCIS. Patient takes letrozole.  Patient has manageable side effects.  She denies any new breast concerns or any other new complaints.  Recent appendicitis, s/p Appendectomy  She has experienced right hip pain which has improved after her surgery, not completely resolved yet.   Review of Systems  Constitutional:  Negative for chills, fever, malaise/fatigue and weight loss.  HENT:  Negative for nosebleeds and sore throat.   Eyes:  Negative for double vision, photophobia and  redness.  Respiratory:  Negative for cough, shortness of breath and wheezing.   Cardiovascular:  Negative for chest pain, palpitations, orthopnea and leg swelling.  Gastrointestinal:  Negative for abdominal pain, blood in stool, nausea and vomiting.  Genitourinary:  Negative for dysuria.  Musculoskeletal:  Positive for joint pain. Negative for back pain, myalgias and neck pain.  Skin:  Negative for itching and rash.  Neurological:  Negative for dizziness, tingling and tremors.  Endo/Heme/Allergies:  Negative for environmental allergies. Does not bruise/bleed easily.       Hot flash  Psychiatric/Behavioral:  Negative for depression and hallucinations.     MEDICAL HISTORY:  Past Medical History:  Diagnosis Date   Allergy    PT CURRENTLY TAKING ABX AND PREDNISONE GIVEN BY DR Jenne Campus ON 01-16-18 FOR INFECTION THAT SHE GETS THIS TIME EVERY YEAR   Anxiety    Arthritis    knees, wrists   Asthma    allergy induced    Breast cancer (HCC)    Cancer (HCC)    COPD (chronic obstructive pulmonary disease) (HCC)    GERD (gastroesophageal reflux disease)    occ-no meds   History of kidney stones    h/o   Personal history of radiation therapy 2019   LEFT lumpectomy   Problems with swallowing and mastication    Psoriasis    Recurrent upper respiratory infection (URI)    Special screening for malignant neoplasms, colon    Wears contact lenses     SURGICAL HISTORY: Past Surgical History:  Procedure Laterality Date   BILATERAL SALPINGOOPHORECTOMY Bilateral 12/30/2013   BREAST BIOPSY Left    DUCTAL CARCINOMA IN SITU (DCIS), LOW NUCLEAR GRADE.   BREAST BIOPSY Left 01/23/2018   Procedure: BREAST BIOPSY/PARTIAL MASTECTOMY WITH NEEDLE LOCALIZATION;  Surgeon: Lattie Haw, MD;  Location: [facility];  Service: General;  Laterality: Left;   BREAST EXCISIONAL BIOPSY Left 01/23/2018   lumectomy with NL    BREAST LUMPECTOMY Left 12/2017   DUCTAL CARCINOMA IN SITU   COLONOSCOPY WITH PROPOFOL N/A  07/07/2016   Procedure: COLONOSCOPY WITH PROPOFOL;  Surgeon: Midge Minium, MD;  Location: [facility];  Service: Endoscopy;  Laterality: N/A;   ESOPHAGOGASTRODUODENOSCOPY (EGD) WITH PROPOFOL N/A 07/07/2016   Procedure: ESOPHAGOGASTRODUODENOSCOPY (EGD) WITH PROPOFOL;  Surgeon: Midge Minium, MD;  Location: [facility];  Service: Endoscopy;  Laterality: N/A;   LAPAROSCOPIC ASSISTED VAGINAL HYSTERECTOMY  12/30/2013   URETHRA SURGERY     14 months old   XI ROBOTIC LAPAROSCOPIC ASSISTED APPENDECTOMY N/A 02/06/2023   Procedure: XI ROBOTIC LAPAROSCOPIC ASSISTED APPENDECTOMY;  Surgeon: Henrene Dodge, MD;  Location: [facility];  Service: General;  Laterality: N/A;    SOCIAL HISTORY: Social History   Socioeconomic History   Marital status: Single    Spouse name: Not on file   Number of children: Not on file   Years of education: Not on file   Highest education level: Not on file  Occupational History   Not on file  Tobacco Use   Smoking status: Every Day    Current packs/day: 1.25    Average packs/day: 1.3 packs/day for 41.0 years (51.3 ttl pk-yrs)    Types: Cigarettes    Passive exposure: Past   Smokeless tobacco: Never  Vaping Use   Vaping status: Never Used  Substance and Sexual Activity   Alcohol use:  Yes    Alcohol/week: 2.0 standard drinks of alcohol    Types: 2 Cans of beer per week    Comment: occ weekend beers   Drug use: No   Sexual activity: Not on file  Other Topics Concern   Not on file  Social History Narrative   Not on file   Social Determinants of Health   Financial Resource Strain: Not on file  Food Insecurity: Not on file  Transportation Needs: Not on file  Physical Activity: Not on file  Stress: Not on file  Social Connections: Not on file  Intimate Partner Violence: Not on file    FAMILY HISTORY: Family History  Problem Relation Age of Onset   Hypertension Mother    High Cholesterol Mother    Allergic rhinitis Mother    Asthma Mother     Hypertension Father    Heart disease Father    COPD Father    High Cholesterol Father    Heart disease Paternal Grandfather    Cancer Sister        ovarian   Allergic rhinitis Sister    Cancer Maternal Aunt    Cancer Paternal Grandmother 40       breast   Breast cancer Paternal Grandmother    Angioedema Neg Hx    Atopy Neg Hx    Eczema Neg Hx    Immunodeficiency Neg Hx    Urticaria Neg Hx     ALLERGIES:  has No Known Allergies.  MEDICATIONS:  Current Outpatient Medications  Medication Sig Dispense Refill   acetaminophen (TYLENOL) 325 MG tablet Take 650 mg by mouth every 6 (six) hours as needed.     albuterol (VENTOLIN HFA) 108 (90 Base) MCG/ACT inhaler Inhale 2 puffs into the lungs every 4 (four) hours as needed for wheezing or shortness of breath (coughing, chest tightness). 18 g 1   atorvastatin (LIPITOR) 20 MG tablet Take 20 mg by mouth daily.     Azelastine-Fluticasone 137-50 MCG/ACT SUSP Place 1 spray into the nose in the morning and at bedtime. For nasal symptoms. 23 g 5   calcium-vitamin D (OSCAL WITH D) 500-200 MG-UNIT tablet Take 1 tablet by mouth daily.     cyanocobalamin 1000 MCG tablet Take 1,000 mcg by mouth daily.     ibuprofen (ADVIL) 600 MG tablet Take 1 tablet (600 mg total) by mouth every 8 (eight) hours as needed for moderate pain. 60 tablet 1   letrozole (FEMARA) 2.5 MG tablet Take 1 tablet (2.5 mg total) by mouth daily. 30 tablet 1   Omega-3 Fatty Acids (OMEGA-3 FISH OIL PO) Take by mouth daily.     omeprazole (PRILOSEC) 20 MG capsule Take 1 capsule (20 mg total) by mouth daily. 30 capsule 5   TRELEGY ELLIPTA 200-62.5-25 MCG/ACT AEPB INHALE 1 PUFF INTO THE LUNGS DAILY. RINSE MOUTH AFTER USE 60 each 5   vitamin C (ASCORBIC ACID) 500 MG tablet Take 500 mg by mouth daily.     simethicone (GAS-X) 80 MG chewable tablet Chew 1 tablet (80 mg total) by mouth 4 (four) times daily as needed for flatulence (gas pain, abdominal cramping, or bloating). (Patient not taking:  Reported on 03/22/2023) 100 tablet 2   No current facility-administered medications for this visit.     PHYSICAL EXAMINATION: ECOG PERFORMANCE STATUS: 0 - Asymptomatic Vitals:   03/22/23 1353  BP: 128/83  Pulse: 93  Resp: 18  Temp: 98.1 F (36.7 C)   Filed Weights   03/22/23 1353  Weight:  144 lb 9.6 oz (65.6 kg)    Physical Exam Constitutional:      General: She is not in acute distress. HENT:     Head: Normocephalic and atraumatic.  Eyes:     General: No scleral icterus.    Conjunctiva/sclera: Conjunctivae normal.     Pupils: Pupils are equal, round, and reactive to light.  Cardiovascular:     Rate and Rhythm: Normal rate and regular rhythm.     Heart sounds: Normal heart sounds.  Pulmonary:     Effort: Pulmonary effort is normal. No respiratory distress.     Breath sounds: Normal breath sounds. No wheezing or rales.  Chest:     Chest wall: No tenderness.  Abdominal:     General: Bowel sounds are normal. There is no distension.     Palpations: Abdomen is soft. There is no mass.     Tenderness: There is no abdominal tenderness.  Musculoskeletal:        General: No deformity. Normal range of motion.     Cervical back: Normal range of motion and neck supple.  Lymphadenopathy:     Cervical: No cervical adenopathy.  Skin:    General: Skin is warm and dry.     Findings: No erythema or rash.  Neurological:     Mental Status: She is alert and oriented to person, place, and time. Mental status is at baseline.     Cranial Nerves: No cranial nerve deficit.     Coordination: Coordination normal.  Psychiatric:        Mood and Affect: Mood normal.        Behavior: Behavior normal.        Thought Content: Thought content normal.       LABORATORY DATA:  I have reviewed the data as listed    Latest Ref Rng & Units 03/22/2023    1:32 PM 11/13/2022    2:42 PM 09/14/2022    2:02 PM  CBC  WBC 4.0 - 10.5 K/uL 7.9  10.2  9.3   Hemoglobin 12.0 - 15.0 g/dL 56.2  13.0  86.5    Hematocrit 36.0 - 46.0 % 40.1  42.1  41.8   Platelets 150 - 400 K/uL 329  322  303       Latest Ref Rng & Units 03/22/2023    1:32 PM 11/13/2022    2:42 PM 09/14/2022    2:02 PM  CMP  Glucose 70 - 99 mg/dL 784  89  696   BUN 6 - 20 mg/dL 13  6  13    Creatinine 0.44 - 1.00 mg/dL 2.95  2.84  1.32   Sodium 135 - 145 mmol/L 136  138  133   Potassium 3.5 - 5.1 mmol/L 4.2  3.7  4.2   Chloride 98 - 111 mmol/L 103  102  98   CO2 22 - 32 mmol/L 25  27  26    Calcium 8.9 - 10.3 mg/dL 9.0  8.9  9.0   Total Protein 6.5 - 8.1 g/dL 6.7  6.8  6.7   Total Bilirubin 0.3 - 1.2 mg/dL 0.6  0.8  0.6   Alkaline Phos 38 - 126 U/L 55  55  49   AST 15 - 41 U/L 22  14  19    ALT 0 - 44 U/L 22  12  15      RADIOGRAPHIC STUDIES: I have personally reviewed the radiological images as listed and agreed with the findings in the report. No results  found.

## 2023-03-22 NOTE — Assessment & Plan Note (Addendum)
#   DCIS s/p lumpectomy and adjuvant RT.  Currently on adjuvant endocrine therapy with letrozole.  She tolerates well with manageable side effects. Recommend patient to continue letrozole 2.5 mg daily.  Prescription sent to pharmacy. Duration of treatment total 5 years -till September 2024.recommend her to stop after that.  Patient has bilateral mammogram ordered through her gynecologist

## 2023-03-22 NOTE — Progress Notes (Signed)
Pt here for follow up. No new concerns voiced. No new breast problems  

## 2023-06-26 NOTE — Progress Notes (Signed)
   [address] [place] [address] Dept: [phone]  FOLLOW UP NOTE  Patient ID: Denise Reyes, female    DOB: 06-16-1966  Age: 57 y.o. MRN: [id number] Date of Office Visit: 06/27/2023  Assessment  Chief Complaint: No chief complaint on file.  HPI Denise Reyes is a 57 year old female who presents to the clinic for follow-up visit.  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  Discussed the use of AI scribe software for clinical note transcription with the patient, who gave verbal consent to proceed.  History of Present Illness             Drug Allergies:  No Known Allergies  Physical Exam: There were no vitals taken for this visit.   Physical Exam  Diagnostics:    Assessment and Plan: No diagnosis found.  No orders of the defined types were placed in this encounter.   There are no Patient Instructions on file for this visit.  No follow-ups on file.    Thank you for the opportunity to care for this patient.  Please do not hesitate to contact me with questions.  Thermon Leyland, FNP [facility]

## 2023-06-26 NOTE — Patient Instructions (Incomplete)
Asthma COPD overlap Continue Trelegy 200-1 puff once a day to prevent cough or wheeze Continue albuterol 2 puffs once every 4 hours as needed for cough or wheeze You may use albuterol 2 puffs 5 to 15 minutes before activity to decrease cough or wheeze  Allergic rhinitis Continue allergen avoidance measures directed toward grass pollen and dog as listed below Continue an over-the-counter antihistamine once a day as needed for runny nose or itch Continue Dymista 2 sprays in each nostril twice a day as needed for nasal symptoms.aasal Consider saline nasal rinses as needed for nasal symptoms. Use this before any medicated nasal sprays for best result Continue saline nasal gel as needed for dry nostrils  Reflux Continue dietary and lifestyle modifications as listed below Continue omeprazole 20 mg once a day for reflux control. Take this medication 30 minutes before your first meal for best results  Tobacco use Continue to cut down on smoking and best to quit  Call the clinic if this treatment plan is not working well for you  Follow up in 6 months or sooner if needed.  Reducing Pollen Exposure The American Academy of Allergy, Asthma and Immunology suggests the following steps to reduce your exposure to pollen during allergy seasons. Do not hang sheets or clothing out to dry; pollen may collect on these items. Do not mow lawns or spend time around freshly cut grass; mowing stirs up pollen. Keep windows closed at night.  Keep car windows closed while driving. Minimize morning activities outdoors, a time when pollen counts are usually at their highest. Stay indoors as much as possible when pollen counts or humidity is high and on windy days when pollen tends to remain in the air longer. Use air conditioning when possible.  Many air conditioners have filters that trap the pollen spores. Use a HEPA room air filter to remove pollen form the indoor air you breathe.  Control of Dog or Cat  Allergen Avoidance is the best way to manage a dog or cat allergy. If you have a dog or cat and are allergic to dog or cats, consider removing the dog or cat from the home. If you have a dog or cat but don't want to find it a new home, or if your family wants a pet even though someone in the household is allergic, here are some strategies that may help keep symptoms at bay:  Keep the pet out of your bedroom and restrict it to only a few rooms. Be advised that keeping the dog or cat in only one room will not limit the allergens to that room. Don't pet, hug or kiss the dog or cat; if you do, wash your hands with soap and water. High-efficiency particulate air (HEPA) cleaners run continuously in a bedroom or living room can reduce allergen levels over time. Regular use of a high-efficiency vacuum cleaner or a central vacuum can reduce allergen levels. Giving your dog or cat a bath at least once a week can reduce airborne allergen.

## 2023-06-27 ENCOUNTER — Other Ambulatory Visit: Payer: Self-pay | Admitting: Family Medicine

## 2023-06-27 ENCOUNTER — Encounter: Payer: Self-pay | Admitting: Family Medicine

## 2023-06-27 ENCOUNTER — Ambulatory Visit (INDEPENDENT_AMBULATORY_CARE_PROVIDER_SITE_OTHER): Payer: 59 | Admitting: Family Medicine

## 2023-06-27 VITALS — BP 120/76 | HR 111 | Temp 98.3°F | Resp 18 | Ht 59.5 in | Wt 138.4 lb

## 2023-06-27 DIAGNOSIS — Z720 Tobacco use: Secondary | ICD-10-CM

## 2023-06-27 DIAGNOSIS — J3089 Other allergic rhinitis: Secondary | ICD-10-CM

## 2023-06-27 DIAGNOSIS — H1013 Acute atopic conjunctivitis, bilateral: Secondary | ICD-10-CM

## 2023-06-27 DIAGNOSIS — J4489 Other specified chronic obstructive pulmonary disease: Secondary | ICD-10-CM

## 2023-06-27 DIAGNOSIS — K219 Gastro-esophageal reflux disease without esophagitis: Secondary | ICD-10-CM

## 2023-06-27 DIAGNOSIS — J302 Other seasonal allergic rhinitis: Secondary | ICD-10-CM

## 2023-06-27 DIAGNOSIS — H1010 Acute atopic conjunctivitis, unspecified eye: Secondary | ICD-10-CM

## 2023-06-27 MED ORDER — TRELEGY ELLIPTA 200-62.5-25 MCG/ACT IN AEPB: INHALATION_SPRAY | RESPIRATORY_TRACT | 5 refills | Status: DC

## 2023-06-27 MED ORDER — AIRSUPRA 90-80 MCG/ACT IN AERO: 2.0000 | INHALATION_SPRAY | RESPIRATORY_TRACT | 5 refills | Status: DC | PRN

## 2023-06-27 MED ORDER — OMEPRAZOLE 20 MG PO CPDR: 20.0000 mg | DELAYED_RELEASE_CAPSULE | Freq: Every day | ORAL | 5 refills | Status: DC

## 2023-09-20 ENCOUNTER — Telehealth: Payer: Self-pay | Admitting: Oncology

## 2023-09-20 ENCOUNTER — Inpatient Hospital Stay: Payer: 59

## 2023-09-20 ENCOUNTER — Inpatient Hospital Stay: Payer: 59 | Admitting: Oncology

## 2023-09-20 NOTE — Telephone Encounter (Signed)
Patient called to reschedule today's appointments due to the weather. Please advise on reschedule.

## 2023-10-03 ENCOUNTER — Inpatient Hospital Stay (HOSPITAL_BASED_OUTPATIENT_CLINIC_OR_DEPARTMENT_OTHER): Payer: 59 | Admitting: Oncology

## 2023-10-03 ENCOUNTER — Inpatient Hospital Stay: Payer: 59 | Attending: Oncology

## 2023-10-03 ENCOUNTER — Inpatient Hospital Stay: Payer: 59

## 2023-10-03 VITALS — BP 113/79 | HR 89 | Temp 97.0°F | Resp 16 | Wt 137.5 lb

## 2023-10-03 DIAGNOSIS — M2550 Pain in unspecified joint: Secondary | ICD-10-CM | POA: Insufficient documentation

## 2023-10-03 DIAGNOSIS — Z803 Family history of malignant neoplasm of breast: Secondary | ICD-10-CM | POA: Insufficient documentation

## 2023-10-03 DIAGNOSIS — F17210 Nicotine dependence, cigarettes, uncomplicated: Secondary | ICD-10-CM | POA: Insufficient documentation

## 2023-10-03 DIAGNOSIS — Z90710 Acquired absence of both cervix and uterus: Secondary | ICD-10-CM | POA: Insufficient documentation

## 2023-10-03 DIAGNOSIS — Z87442 Personal history of urinary calculi: Secondary | ICD-10-CM | POA: Insufficient documentation

## 2023-10-03 DIAGNOSIS — N840 Polyp of corpus uteri: Secondary | ICD-10-CM | POA: Insufficient documentation

## 2023-10-03 DIAGNOSIS — D0512 Intraductal carcinoma in situ of left breast: Secondary | ICD-10-CM | POA: Diagnosis present

## 2023-10-03 DIAGNOSIS — M8580 Other specified disorders of bone density and structure, unspecified site: Secondary | ICD-10-CM

## 2023-10-03 DIAGNOSIS — K219 Gastro-esophageal reflux disease without esophagitis: Secondary | ICD-10-CM | POA: Insufficient documentation

## 2023-10-03 DIAGNOSIS — Z83438 Family history of other disorder of lipoprotein metabolism and other lipidemia: Secondary | ICD-10-CM | POA: Diagnosis not present

## 2023-10-03 DIAGNOSIS — Z8349 Family history of other endocrine, nutritional and metabolic diseases: Secondary | ICD-10-CM | POA: Diagnosis not present

## 2023-10-03 DIAGNOSIS — Z79899 Other long term (current) drug therapy: Secondary | ICD-10-CM | POA: Diagnosis not present

## 2023-10-03 DIAGNOSIS — N6022 Fibroadenosis of left breast: Secondary | ICD-10-CM | POA: Insufficient documentation

## 2023-10-03 DIAGNOSIS — Z825 Family history of asthma and other chronic lower respiratory diseases: Secondary | ICD-10-CM | POA: Insufficient documentation

## 2023-10-03 DIAGNOSIS — Z8249 Family history of ischemic heart disease and other diseases of the circulatory system: Secondary | ICD-10-CM | POA: Insufficient documentation

## 2023-10-03 DIAGNOSIS — Z90722 Acquired absence of ovaries, bilateral: Secondary | ICD-10-CM | POA: Insufficient documentation

## 2023-10-03 DIAGNOSIS — Z720 Tobacco use: Secondary | ICD-10-CM | POA: Diagnosis not present

## 2023-10-03 DIAGNOSIS — Z79811 Long term (current) use of aromatase inhibitors: Secondary | ICD-10-CM | POA: Diagnosis not present

## 2023-10-03 DIAGNOSIS — Z9049 Acquired absence of other specified parts of digestive tract: Secondary | ICD-10-CM | POA: Diagnosis not present

## 2023-10-03 DIAGNOSIS — Z8041 Family history of malignant neoplasm of ovary: Secondary | ICD-10-CM | POA: Insufficient documentation

## 2023-10-03 LAB — CMP (CANCER CENTER ONLY)
ALT: 15 U/L (ref 0–44)
AST: 18 U/L (ref 15–41)
Albumin: 3.9 g/dL (ref 3.5–5.0)
Alkaline Phosphatase: 51 U/L (ref 38–126)
Anion gap: 7 (ref 5–15)
BUN: 7 mg/dL (ref 6–20)
CO2: 24 mmol/L (ref 22–32)
Calcium: 8.6 mg/dL — ABNORMAL LOW (ref 8.9–10.3)
Chloride: 100 mmol/L (ref 98–111)
Creatinine: 0.55 mg/dL (ref 0.44–1.00)
GFR, Estimated: >60 mL/min (ref >60)
Glucose, Bld: 105 mg/dL — ABNORMAL HIGH (ref 70–99)
Potassium: 3.7 mmol/L (ref 3.5–5.1)
Sodium: 131 mmol/L — ABNORMAL LOW (ref 135–145)
Total Bilirubin: 0.6 mg/dL (ref 0.0–1.2)
Total Protein: 6.4 g/dL — ABNORMAL LOW (ref 6.5–8.1)

## 2023-10-03 LAB — CBC WITH DIFFERENTIAL (CANCER CENTER ONLY)
Abs Immature Granulocytes: 0.01 10*3/uL (ref 0.00–0.07)
Basophils Absolute: 0 10*3/uL (ref 0.0–0.1)
Basophils Relative: 1 %
Eosinophils Absolute: 0.1 10*3/uL (ref 0.0–0.5)
Eosinophils Relative: 1 %
HCT: 39.8 % (ref 36.0–46.0)
Hemoglobin: 13.7 g/dL (ref 12.0–15.0)
Immature Granulocytes: 0 %
Lymphocytes Relative: 38 %
Lymphs Abs: 2.4 10*3/uL (ref 0.7–4.0)
MCH: 32.4 pg (ref 26.0–34.0)
MCHC: 34.4 g/dL (ref 30.0–36.0)
MCV: 94.1 fL (ref 80.0–100.0)
Monocytes Absolute: 0.5 10*3/uL (ref 0.1–1.0)
Monocytes Relative: 8 %
Neutro Abs: 3.4 10*3/uL (ref 1.7–7.7)
Neutrophils Relative %: 52 %
Platelet Count: 268 10*3/uL (ref 150–400)
RBC: 4.23 MIL/uL (ref 3.87–5.11)
RDW: 12.3 % (ref 11.5–15.5)
WBC Count: 6.4 10*3/uL (ref 4.0–10.5)
nRBC: 0 % (ref 0.0–0.2)

## 2023-10-03 MED ORDER — ZOLEDRONIC ACID 4 MG/100ML IV SOLN
4.0000 mg | Freq: Once | INTRAVENOUS | Status: AC
  Administered 2023-10-03: 4 mg via INTRAVENOUS
  Filled 2023-10-03: qty 100

## 2023-10-03 MED ORDER — SODIUM CHLORIDE 0.9 % IV SOLN
INTRAVENOUS | Status: DC
  Filled 2023-10-03: qty 250

## 2023-10-03 NOTE — Assessment & Plan Note (Signed)
Smoke cessation recommended  Patient has been up-to-date for screening.

## 2023-10-03 NOTE — Assessment & Plan Note (Addendum)
#  Osteopenia in the context of history of chronic aromatase inhibitor use. Zometa every 6 months - proceed today.  Continue calcium and vitamin D supplementation. 03/2022 DEXA showed osteopenia. Recommend to repeat every 2 years.  Patient plans to discuss with her gyn and pcp to see if bisphosphonate treatments can be done at their office.

## 2023-10-03 NOTE — Assessment & Plan Note (Addendum)
#   DCIS s/p lumpectomy and adjuvant RT.  Currently on adjuvant endocrine therapy with letrozole.  She tolerates well with manageable side effects. Previously on letrozole 2.5 mg daily.  Duration of treatment total 5 years -finished 5 years of endocrine therapy in September 2024 Patient gets bilateral mammogram done, ordered her gynec[facility] - [facility] [place], Mammogram records are not available in [place]]

## 2023-10-03 NOTE — Progress Notes (Signed)
 Hematology/Oncology Progress note [phone] [phone]     REASON FOR VISIT Follow up for DCIS  ASSESSMENT & PLAN:   Ductal carcinoma in situ (DCIS) of left breast # DCIS s/p lumpectomy and adjuvant RT.  Currently on adjuvant endocrine therapy with letrozole.  She tolerates well with manageable side effects. Previously on letrozole 2.5 mg daily.  Duration of treatment total 5 years -finished 5 years of endocrine therapy in September 2024 Patient gets bilateral mammogram done, ordered her gynecologist - [[facility] [place], Mammogram records are not available in [place]]   Osteopenia #Osteopenia in the context of history of chronic aromatase inhibitor use. Zometa every 6 months - proceed today.  Continue calcium and vitamin D supplementation. 03/2022 DEXA showed osteopenia. Recommend to repeat every 2 years.  Patient plans to discuss with her gyn and pcp to see if bisphosphonate treatments can be done at their office.   Tobacco user Smoke cessation recommended  Patient has been up-to-date for screening.  Hypocalcemia Recommend patient to increase calcium to 1200mg  daily.    Follow up PRN. Patient will call back to schedule if she would like to continue bisphosphonate treatments at cancer center.   All questions were answered. The patient knows to call the clinic with any problems, questions or concerns.  Rickard Patience, MD, PhD [facility] Hematology Oncology 10/03/2023    HISTORY OF PRESENTING ILLNESS:  Patient had a mammogram done in May 2019.  Mammogram showed indeterminate calcifications in the upper outer quadrant of the left breast for which biopsy is indicated.  Patient is status post stereotactic guided core biopsy of the left breast calcifications. 12/26/2017 left breast biopsy upper outer quadrant showed DCIS, low nuclear grade, with back ground of fibrocystic change, include sclerosing adenosis and calcifications, negative for invasive  carcinoma.   Patient underwent left breast lumpectomy on 01/23/2018, pathology showed fibrocystic change with microcalcifications  # Patient's case was discussed at breast tumor board on 02/11/2018. patient that although her DCIS foci is very small, her breast has very proliferative background, consistent with hormone replacement treatment.  Status post adjuvant RT completed in September 2019. Letrozole was started in September 2019   # Postmenopausal status: obtained patient's GYN operation report from Dr. Geanie Berlin office. Patient is in menopasue state as she had had bilateral salpingo-oophorectomy and hysterectomy on 12/30/2013. Pathology reviewed.  Showed benign essentially unremarkable cervix, benign proliferative endometrium associated with benign endometrial polyp.  Adenomyosis, leiomyomata, benign right ovary with small foci of endometriosis, benign right paratubal cyst essentially unremarkable left ovary and the fallopian tube.  Marland Kitchen #We obtained her records from GYN office and reviewed. DEXA done in May 2019 showed left femoral neck T score -1.1, AP spine -0.3, right femoral neck -1.1  #September 2019, started on letrozole  INTERVAL HISTORY Denise Reyes is a 58 y.o. female who has above history reviewed by me today presents for management of DCIS. Patient has finished 5 years of Letrozole in Sept 2024  She denies any new breast concerns or any other new complaints.    Review of Systems  Constitutional:  Negative for chills, fever, malaise/fatigue and weight loss.  HENT:  Negative for nosebleeds and sore throat.   Eyes:  Negative for double vision, photophobia and redness.  Respiratory:  Negative for cough, shortness of breath and wheezing.   Cardiovascular:  Negative for chest pain, palpitations, orthopnea and leg swelling.  Gastrointestinal:  Negative for abdominal pain, blood in stool, nausea and vomiting.  Genitourinary:  Negative for dysuria.  Musculoskeletal:  Positive  for joint pain. Negative for back pain, myalgias and neck pain.  Skin:  Negative for itching and rash.  Neurological:  Negative for dizziness, tingling and tremors.  Endo/Heme/Allergies:  Negative for environmental allergies. Does not bruise/bleed easily.       Hot flash  Psychiatric/Behavioral:  Negative for depression and hallucinations.     MEDICAL HISTORY:  Past Medical History:  Diagnosis Date   Allergy    PT CURRENTLY TAKING ABX AND PREDNISONE GIVEN BY DR Jenne Campus ON 01-16-18 FOR INFECTION THAT SHE GETS THIS TIME EVERY YEAR   Anxiety    Arthritis    knees, wrists   Asthma    allergy induced    Breast cancer (HCC)    Cancer (HCC)    COPD (chronic obstructive pulmonary disease) (HCC)    GERD (gastroesophageal reflux disease)    occ-no meds   History of kidney stones    h/o   Personal history of radiation therapy 2019   LEFT lumpectomy   Problems with swallowing and mastication    Psoriasis    Recurrent upper respiratory infection (URI)    Special screening for malignant neoplasms, colon    Wears contact lenses     SURGICAL HISTORY: Past Surgical History:  Procedure Laterality Date   BILATERAL SALPINGOOPHORECTOMY Bilateral 12/30/2013   BREAST BIOPSY Left    DUCTAL CARCINOMA IN SITU (DCIS), LOW NUCLEAR GRADE.   BREAST BIOPSY Left 01/23/2018   Procedure: BREAST BIOPSY/PARTIAL MASTECTOMY WITH NEEDLE LOCALIZATION;  Surgeon: Lattie Haw, MD;  Location: [facility] OR[facility];  Service: General;  Laterality: Left;   BREAST EXCISIONAL BIOPSY Left 01/23/2018   lumectomy with NL    BREAST LUMPECTOMY Left 12/2017   DUCTAL CARCINOMA IN SITU   COLONOSCOPY WITH PROPOFOL N/A 07/07/2016   Procedure: COLONOSCOPY WITH PROPOFOL;  Surgeon: Midge Minium, MD;  Location: [facility];  Service: Endoscopy;  Laterality: N/A;   ESOPHAGOGASTRODUODENOSCOPY (EGD) WITH PROPOFOL N/A 07/07/2016   Procedure: ESOPHAGOGASTRODUODENOSCOPY (EGD) WITH PROPOFOL;  Surgeon: Midge Minium, MD;  Location: [facility];  Service: Endoscopy;  Laterality: N/A;   LAPAROSCOPIC ASSISTED VAGINAL HYSTERECTOMY  12/30/2013   URETHRA SURGERY     14 months old   XI ROBOTIC LAPAROSCOPIC ASSISTED APPENDECTOMY N/A 02/06/2023   Procedure: XI ROBOTIC LAPAROSCOPIC ASSISTED APPENDECTOMY;  Surgeon: Henrene Dodge, MD;  Location: [facility];  Service: General;  Laterality: N/A;    SOCIAL HISTORY: Social History   Socioeconomic History   Marital status: Single    Spouse name: Not on file   Number of children: Not on file   Years of education: Not on file   Highest education level: Not on file  Occupational History   Not on file  Tobacco Use   Smoking status: Every Day    Current packs/day: 1.25    Average packs/day: 1.3 packs/day for 41.0 years (51.3 ttl pk-yrs)    Types: Cigarettes    Passive exposure: Past   Smokeless tobacco: Never  Vaping Use   Vaping status: Never Used  Substance and Sexual Activity   Alcohol use: Yes    Alcohol/week: 2.0 standard drinks of alcohol    Types: 2 Cans of beer per week    Comment: occ weekend beers   Drug use: No   Sexual activity: Not on file  Other Topics Concern   Not on file  Social History Narrative   Not on file   Social Drivers of Health   Financial Resource Strain:  Not on file  Food Insecurity: Not on file  Transportation Needs: Not on file  Physical Activity: Not on file  Stress: Not on file  Social Connections: Not on file  Intimate Partner Violence: Not on file    FAMILY HISTORY: Family History  Problem Relation Age of Onset   Hypertension Mother    High Cholesterol Mother    Allergic rhinitis Mother    Asthma Mother    Hypertension Father    Heart disease Father    COPD Father    High Cholesterol Father    Heart disease Paternal Grandfather    Cancer Sister        ovarian   Allergic rhinitis Sister    Cancer Maternal Aunt    Cancer Paternal Grandmother 49       breast   Breast cancer Paternal Grandmother    Angioedema Neg Hx     Atopy Neg Hx    Eczema Neg Hx    Immunodeficiency Neg Hx    Urticaria Neg Hx     ALLERGIES:  has no known allergies.  MEDICATIONS:  Current Outpatient Medications  Medication Sig Dispense Refill   acetaminophen (TYLENOL) 325 MG tablet Take 650 mg by mouth every 6 (six) hours as needed.     albuterol (VENTOLIN HFA) 108 (90 Base) MCG/ACT inhaler INHALE 2 PUFFS INTO THE LUNGS EVERY 4 HOURS AS NEEDED FOR WHEEZING OR SHORTNESS OF BREATH 8 g 2   Albuterol-Budesonide (AIRSUPRA) 90-80 MCG/ACT AERO Inhale 2 puffs into the lungs every 4 (four) hours as needed. 10.7 g 5   atorvastatin (LIPITOR) 20 MG tablet Take 20 mg by mouth daily.     Azelastine-Fluticasone 137-50 MCG/ACT SUSP Place 1 spray into the nose in the morning and at bedtime. For nasal symptoms. 23 g 5   calcium-vitamin D (OSCAL WITH D) 500-200 MG-UNIT tablet Take 1 tablet by mouth daily.     cyanocobalamin 1000 MCG tablet Take 1,000 mcg by mouth daily.     ibuprofen (ADVIL) 600 MG tablet Take 1 tablet (600 mg total) by mouth every 8 (eight) hours as needed for moderate pain. 60 tablet 1   Omega-3 Fatty Acids (OMEGA-3 FISH OIL PO) Take by mouth daily.     omeprazole (PRILOSEC) 20 MG capsule Take 1 capsule (20 mg total) by mouth daily. 30 capsule 5   TRELEGY ELLIPTA 200-62.5-25 MCG/ACT AEPB INHALE 1 PUFF INTO THE LUNGS DAILY. RINSE MOUTH AFTER USE 60 each 5   vitamin C (ASCORBIC ACID) 500 MG tablet Take 500 mg by mouth daily.     letrozole (FEMARA) 2.5 MG tablet Take 1 tablet (2.5 mg total) by mouth daily. (Patient not taking: Reported on 10/03/2023) 30 tablet 1   simethicone (GAS-X) 80 MG chewable tablet Chew 1 tablet (80 mg total) by mouth 4 (four) times daily as needed for flatulence (gas pain, abdominal cramping, or bloating). (Patient not taking: Reported on 10/03/2023) 100 tablet 2   No current facility-administered medications for this visit.   Facility-Administered Medications Ordered in Other Visits  Medication Dose Route Frequency  Provider Last Rate Last Admin   0.9 %  sodium chloride infusion   Intravenous Continuous Rickard Patience, MD   Stopped at 10/03/23 1457     PHYSICAL EXAMINATION: ECOG PERFORMANCE STATUS: 0 - Asymptomatic Vitals:   10/03/23 1348  BP: 113/79  Pulse: 89  Resp: 16  Temp: (!) 97 F (36.1 C)  SpO2: 100%   Filed Weights   10/03/23 1348  Weight: 137  lb 8 oz (62.4 kg)    Physical Exam Constitutional:      General: She is not in acute distress. HENT:     Head: Normocephalic and atraumatic.  Eyes:     General: No scleral icterus. Cardiovascular:     Rate and Rhythm: Normal rate and regular rhythm.     Heart sounds: Normal heart sounds.  Pulmonary:     Effort: Pulmonary effort is normal. No respiratory distress.     Breath sounds: Normal breath sounds. No wheezing.  Abdominal:     General: Bowel sounds are normal. There is no distension.     Palpations: Abdomen is soft.  Musculoskeletal:        General: No deformity. Normal range of motion.     Cervical back: Normal range of motion and neck supple.  Lymphadenopathy:     Cervical: No cervical adenopathy.  Skin:    General: Skin is warm and dry.     Findings: No erythema or rash.  Neurological:     Mental Status: She is alert and oriented to person, place, and time. Mental status is at baseline.  Psychiatric:        Mood and Affect: Mood normal.       LABORATORY DATA:  I have reviewed the data as listed    Latest Ref Rng & Units 10/03/2023    1:31 PM 03/22/2023    1:32 PM 11/13/2022    2:42 PM  CBC  WBC 4.0 - 10.5 K/uL 6.4  7.9  10.2   Hemoglobin 12.0 - 15.0 g/dL 54.0  98.1  19.1   Hematocrit 36.0 - 46.0 % 39.8  40.1  42.1   Platelets 150 - 400 K/uL 268  329  322       Latest Ref Rng & Units 10/03/2023    1:31 PM 03/22/2023    1:32 PM 11/13/2022    2:42 PM  CMP  Glucose 70 - 99 mg/dL 478  295  89   BUN 6 - 20 mg/dL 7  13  6    Creatinine 0.44 - 1.00 mg/dL 6.21  3.08  6.57   Sodium 135 - 145 mmol/L 131  136  138    Potassium 3.5 - 5.1 mmol/L 3.7  4.2  3.7   Chloride 98 - 111 mmol/L 100  103  102   CO2 22 - 32 mmol/L 24  25  27    Calcium 8.9 - 10.3 mg/dL 8.6  9.0  8.9   Total Protein 6.5 - 8.1 g/dL 6.4  6.7  6.8   Total Bilirubin 0.0 - 1.2 mg/dL 0.6  0.6  0.8   Alkaline Phos 38 - 126 U/L 51  55  55   AST 15 - 41 U/L 18  22  14    ALT 0 - 44 U/L 15  22  12      RADIOGRAPHIC STUDIES: I have personally reviewed the radiological images as listed and agreed with the findings in the report. No results found.

## 2023-10-03 NOTE — Patient Instructions (Signed)

## 2023-10-03 NOTE — Assessment & Plan Note (Signed)
 Recommend patient to increase calcium to 1200mg  daily.

## 2023-10-15 ENCOUNTER — Ambulatory Visit: Payer: 59

## 2023-10-16 ENCOUNTER — Ambulatory Visit
Admission: RE | Admit: 2023-10-16 | Discharge: 2023-10-16 | Disposition: A | Discharge status: Home / Self Care | Payer: 59 | Source: Ambulatory Visit | Attending: Acute Care | Admitting: Acute Care

## 2023-10-16 DIAGNOSIS — F17210 Nicotine dependence, cigarettes, uncomplicated: Secondary | ICD-10-CM

## 2023-10-16 DIAGNOSIS — Z87891 Personal history of nicotine dependence: Secondary | ICD-10-CM

## 2023-11-26 ENCOUNTER — Other Ambulatory Visit: Payer: Self-pay | Admitting: Acute Care

## 2023-11-26 DIAGNOSIS — F17210 Nicotine dependence, cigarettes, uncomplicated: Secondary | ICD-10-CM

## 2023-11-26 DIAGNOSIS — Z122 Encounter for screening for malignant neoplasm of respiratory organs: Secondary | ICD-10-CM

## 2023-11-26 DIAGNOSIS — Z87891 Personal history of nicotine dependence: Secondary | ICD-10-CM

## 2024-01-08 ENCOUNTER — Other Ambulatory Visit: Payer: Self-pay | Admitting: Family Medicine

## 2024-02-13 ENCOUNTER — Encounter: Payer: Self-pay | Admitting: Allergy & Immunology

## 2024-02-13 ENCOUNTER — Ambulatory Visit: Admitting: Allergy & Immunology

## 2024-02-13 VITALS — BP 118/60 | HR 109 | Temp 98.0°F | Ht 59.45 in | Wt 136.4 lb

## 2024-02-13 DIAGNOSIS — H1013 Acute atopic conjunctivitis, bilateral: Secondary | ICD-10-CM

## 2024-02-13 DIAGNOSIS — H1010 Acute atopic conjunctivitis, unspecified eye: Secondary | ICD-10-CM

## 2024-02-13 DIAGNOSIS — J302 Other seasonal allergic rhinitis: Secondary | ICD-10-CM

## 2024-02-13 DIAGNOSIS — J4489 Other specified chronic obstructive pulmonary disease: Secondary | ICD-10-CM | POA: Diagnosis not present

## 2024-02-13 DIAGNOSIS — K219 Gastro-esophageal reflux disease without esophagitis: Secondary | ICD-10-CM | POA: Diagnosis not present

## 2024-02-13 DIAGNOSIS — J3089 Other allergic rhinitis: Secondary | ICD-10-CM

## 2024-02-13 MED ORDER — ALBUTEROL SULFATE HFA 108 (90 BASE) MCG/ACT IN AERS: 2.0000 | INHALATION_SPRAY | RESPIRATORY_TRACT | 2 refills | Status: AC | PRN

## 2024-02-13 MED ORDER — TRELEGY ELLIPTA 200-62.5-25 MCG/ACT IN AEPB: INHALATION_SPRAY | RESPIRATORY_TRACT | 5 refills | Status: DC

## 2024-02-13 NOTE — Progress Notes (Signed)
 FOLLOW UP  Date of Service/Encounter:  02/13/24   Assessment:   Asthma COPD overlap  Seasonal and perennial allergic rhinitis  GERD  Takes care of 13 cats   Plan/Recommendations:   1. Asthma with COPD (chronic obstructive pulmonary disease) - Lung testing looks decent today, but it is lower than last time. - I would guess that this is a reflection of the heat and humidity.  - We could add on an injectable asthma medication called Tezspire to see if that helps your trelegy work better. - Handout provided about Tezspire - Daily controller medication(s): Trelegy 200/62.5/25mcg one puff once daily - Prior to physical activity: AirSupra  2 puffs 10-15 minutes before physical activity. - Rescue medications: AirSupra  2 puffs every 4-6 hours as needed - Asthma control goals:  * Full participation in all desired activities (may need albuterol  before activity) * Albuterol  use two time or less a week on average (not counting use with activity) * Cough interfering with sleep two time or less a month * Oral steroids no more than once a year * No hospitalizations  2. Perennial allergic rhinoconjunctivitis - Continue with the cetirizine +/- D daily as needed.  - Continue with the nasal spray 1-2 sprays per nostril daily as needed.   3. Gastroesophageal reflux disease - Continue with the Prilosec 1-2 times daily.  - You seem to have everything under good control.  4. Return in about 6 months (around 08/15/2024). You can have the follow up appointment with Dr. Iva or a Nurse Practicioner (our Nurse Practitioners are excellent and always have Physician oversight!).   Subjective:   Denise Reyes is a 58 y.o. female presenting today for follow up of  Chief Complaint  Patient presents with   Establish Care   Follow-up    DAFINA SUK has a history of the following: Patient Active Problem List   Diagnosis Date Noted   Hypocalcemia 10/03/2023   Acute appendicitis 02/06/2023    Gastroesophageal reflux disease 01/18/2022   Seasonal and perennial allergic rhinoconjunctivitis 02/24/2020   Heartburn 12/17/2019   Asthma with COPD (chronic obstructive pulmonary disease) (HCC) 12/17/2019   Tobacco user 12/17/2019   Osteopenia 05/28/2018   Ductal carcinoma in situ (DCIS) of left breast    Special screening for malignant neoplasms, colon    Problems with swallowing and mastication     History obtained from: chart review and patient.  Discussed the use of AI scribe software for clinical note transcription with the patient and/or guardian, who gave verbal consent to proceed.  Amyre is a 58 y.o. female presenting for a follow up visit.  She was last seen in November 2024 by Arlean one of our nurse practitioners.  At that time, she was continued on Trelegy 200 mcg 1 puff daily.  She was also started on Airsupra  2 puffs as needed.  For her rhinitis, she continued on Dymista  as well as an over-the-counter antihistamine as needed.  Reflux was controlled with omeprazole .  Since last visit, she has done very well.  Asthma/Respiratory Symptom History: Her breathing is generally normal, but she experiences coughing fits when working outside in the heat. She uses Trelegy daily. She also uses AirSleeper before physical activities to prevent shortness of breath. For her cough, she finds albuterol  more effective than AirSleeper, despite both containing albuterol . She has not required hospital visits or prednisone for her breathing issues. Her respiratory issues began after a severe respiratory infection following extensive yard work around [employer] several years  ago. Initially thought to be allergy-related, she was treated with prednisone and antibiotics for years before seeing a specialist.  She smokes cigarettes, purchasing two cartons at a time, and acknowledges the expense. She jokes about quitting smoking but has not made any recent attempts to do so.   Allergic Rhinitis Symptom  History: Her allergies are primarily managed with Zyrtec and occasionally Zyrtec D for nighttime congestion. She uses Dymista  nasal spray infrequently. She has a known allergy to dogs but reports no issues with her own dog or the 13 cats she cares for.  GERD Symptom History: She takes Prilosec daily for heartburn, occasionally requiring a second dose after consuming certain foods like tomatoes.   She completed a five-year course of letrozole  following the discovery of a small breast tumor in 2019, which was treated with radiation. She experienced some tiredness towards the end of her radiation treatment but no significant issues.  Otherwise, there have been no changes to her past medical history, surgical history, family history, or social history.    Review of systems otherwise negative other than that mentioned in the HPI.    Objective:   Blood pressure 118/60, pulse (!) 109, temperature 98 F (36.7 C), height 4' 11.45 (1.51 m), weight 136 lb 6.4 oz (61.9 kg), SpO2 96%. Body mass index is 27.13 kg/m.    Physical Exam Vitals reviewed.  Constitutional:      Appearance: She is well-developed.     Comments: Smiling and interactive. Cooperate with the exam.   HENT:     Head: Normocephalic and atraumatic.     Right Ear: Tympanic membrane, ear canal and external ear normal.     Left Ear: Tympanic membrane, ear canal and external ear normal.     Nose: No nasal deformity, septal deviation, mucosal edema or rhinorrhea.     Right Turbinates: Enlarged, swollen and pale.     Left Turbinates: Enlarged, swollen and pale.     Right Sinus: No maxillary sinus tenderness or frontal sinus tenderness.     Left Sinus: No maxillary sinus tenderness or frontal sinus tenderness.     Mouth/Throat:     Mouth: Mucous membranes are not pale and not dry.     Pharynx: Uvula midline.  Eyes:     General: Lids are normal. No allergic shiner.       Right eye: No discharge.        Left eye: No discharge.      Conjunctiva/sclera: Conjunctivae normal.     Right eye: Right conjunctiva is not injected. No chemosis.    Left eye: Left conjunctiva is not injected. No chemosis.    Pupils: Pupils are equal, round, and reactive to light.  Cardiovascular:     Rate and Rhythm: Normal rate and regular rhythm.     Heart sounds: Normal heart sounds.  Pulmonary:     Effort: Pulmonary effort is normal. No tachypnea, accessory muscle usage or respiratory distress.     Breath sounds: Normal breath sounds. No wheezing, rhonchi or rales.  Chest:     Chest wall: No tenderness.  Lymphadenopathy:     Cervical: No cervical adenopathy.  Skin:    Coloration: Skin is not pale.     Findings: No abrasion, erythema, petechiae or rash. Rash is not papular, urticarial or vesicular.  Neurological:     Mental Status: She is alert.  Psychiatric:        Behavior: Behavior is cooperative.      Diagnostic studies:  Spirometry: results normal (FEV1: 1.57/73%, FVC: 2.39/89%, FEV1/FVC: 66%).    Spirometry consistent with mild obstructive disease.   Allergy Studies: none       Marty Shaggy, MD  [facility] 

## 2024-02-13 NOTE — Patient Instructions (Addendum)
 1. Asthma with COPD (chronic obstructive pulmonary disease) - Lung testing looks decent today, but it is lower than last time. - I would guess that this is a reflection of the heat and humidity.  - We could add on an injectable asthma medication called Tezspire to see if that helps your trelegy work better. - Handout provided about Tezspire - Daily controller medication(s): Trelegy 200/62.5/25mcg one puff once daily - Prior to physical activity: AirSupra  2 puffs 10-15 minutes before physical activity. - Rescue medications: AirSupra  2 puffs every 4-6 hours as needed - Asthma control goals:  * Full participation in all desired activities (may need albuterol  before activity) * Albuterol  use two time or less a week on average (not counting use with activity) * Cough interfering with sleep two time or less a month * Oral steroids no more than once a year * No hospitalizations  2. Perennial allergic rhinoconjunctivitis - Continue with the cetirizine +/- D daily as needed.  - Continue with the nasal spray 1-2 sprays per nostril daily as needed.   3. Gastroesophageal reflux disease - Continue with the Prilosec 1-2 times daily.  - You seem to have everything under good control.  4. Return in about 6 months (around 08/15/2024). You can have the follow up appointment with Dr. Iva or a Nurse Practicioner (our Nurse Practitioners are excellent and always have Physician oversight!).    Please inform us  of any Emergency Department visits, hospitalizations, or changes in symptoms. Call us  before going to the ED for breathing or allergy symptoms since we might be able to fit you in for a sick visit. Feel free to contact us  anytime with any questions, problems, or concerns.  It was a pleasure to meet you today!  Websites that have reliable patient information: 1. American Academy of Asthma, Allergy, and Immunology: www.aaaai.org 2. Food Allergy Research and Education (FARE): foodallergy.org 3.  Mothers of Asthmatics: http://www.asthmacommunitynetwork.org 4. American College of Allergy, Asthma, and Immunology: www.acaai.org      "Like" us  on Facebook and Instagram for our latest updates!      A healthy democracy works best when [employer] participate! Make sure you are registered to vote! If you have moved or changed any of your contact information, you will need to get this updated before voting! Scan the QR codes below to learn more!

## 2024-05-19 ENCOUNTER — Other Ambulatory Visit: Payer: Self-pay

## 2024-05-19 MED ORDER — OMEPRAZOLE 20 MG PO CPDR: DELAYED_RELEASE_CAPSULE | ORAL | 3 refills | Status: AC

## 2024-05-19 NOTE — Telephone Encounter (Signed)
 Received refill request of omeprazole  via fax. It has now been sent in.

## 2024-08-21 NOTE — Progress Notes (Signed)
" ° °  [address] [place] [address] Dept: [phone]  FOLLOW UP NOTE  Patient ID: Denise Reyes, female    DOB: 05-16-1966  Age: 59 y.o. MRN: [id number] Date of Office Visit: 08/22/2024  Assessment  Chief Complaint: No chief complaint on file.  HPI Denise Reyes is a 59 year old female who presents to the clinic for follow-up visit.  She was last seen in this clinic on 02/13/2024 by Dr. Iva for evaluation of asthma COPD overlap syndrome, allergic rhinitis, and reflux.  Her last environmental allergy testing was on 12/18/2019 was positive to grass pollen and dog.  Discussed the use of AI scribe software for clinical note transcription with the patient, who gave verbal consent to proceed.  History of Present Illness      Drug Allergies:  Allergies[1]  Physical Exam: There were no vitals taken for this visit.   Physical Exam  Diagnostics:    Assessment and Plan: No diagnosis found.  No orders of the defined types were placed in this encounter.   There are no Patient Instructions on file for this visit.  No follow-ups on file.    Thank you for the opportunity to care for this patient.  Please do not hesitate to contact me with questions.  Arlean Mutter, FNP [facility]          [1] No Known Allergies  "

## 2024-08-21 NOTE — Patient Instructions (Incomplete)
 Asthma COPD overlap Use albuterol  via nebulizer followed by budesonide via nebulizer and Trelegy in the morning. In the evening, use albuterol  and budesonide via nebulizer. Continue this for 1-2 weeks or until cough and wheeze free, then stop the nebulizer medications and continue Trelegy daily Continue Trelegy 200-1 puff once a day to prevent cough or wheeze Continue AirAupra 2 puffs as needed. Do not use more than 12 puffs in a 24 hour period You may use AirSupra  2 puffs 5 to 15 minutes before activity to decrease cough or wheeze Consider a biologic therapy if your symptoms are not well-controlled with Trelegy and Airsupra .  Handout given for [facility] at last visit  Allergic rhinitis Continue allergen avoidance measures directed toward grass pollen and dog as listed below Continue an over-the-counter antihistamine once a day as needed for runny nose or itch Consider saline nasal rinses as needed for nasal symptoms. Use this before any medicated nasal sprays for best result Consider Flonase 2 sprays in each nostril once a day if needed for a stuffy nose.  In the right nostril, point the applicator out toward the right ear. In the left nostril, point the applicator out toward the left ear Consider saline nasal rinses as needed for nasal symptoms. Use this before any medicated nasal sprays for best result Continue saline nasal gel as needed for dry nostrils  Reflux Continue dietary and lifestyle modifications as listed below Continue omeprazole  20 mg once a day for reflux control. Take this medication 30 minutes before your first meal for best results  Tobacco use Continue to cut down on smoking and best to quit  Call the clinic if this treatment plan is not working well for you  Follow up in 2 months or sooner if needed.  Reducing Pollen Exposure The [employer] suggests the following steps to reduce your exposure to pollen during allergy seasons. Do  not hang sheets or clothing out to dry; pollen may collect on these items. Do not mow lawns or spend time around freshly cut grass; mowing stirs up pollen. Keep windows closed at night.  Keep car windows closed while driving. Minimize morning activities outdoors, a time when pollen counts are usually at their highest. Stay indoors as much as possible when pollen counts or humidity is high and on windy days when pollen tends to remain in the air longer. Use air conditioning when possible.  Many air conditioners have filters that trap the pollen spores. Use a HEPA room air filter to remove pollen form the indoor air you breathe.  Control of Dog or Cat Allergen Avoidance is the best way to manage a dog or cat allergy. If you have a dog or cat and are allergic to dog or cats, consider removing the dog or cat from the home. If you have a dog or cat but dont want to find it a new home, or if your family wants a pet even though someone in the household is allergic, here are some strategies that may help keep symptoms at bay:  Keep the pet out of your bedroom and restrict it to only a few rooms. Be advised that keeping the dog or cat in only one room will not limit the allergens to that room. Dont pet, hug or kiss the dog or cat; if you do, wash your hands with soap and water . High-efficiency particulate air (HEPA) cleaners run continuously in a bedroom or living room can reduce allergen levels over time. Regular  use of a high-efficiency vacuum cleaner or a central vacuum can reduce allergen levels. Giving your dog or cat a bath at least once a week can reduce airborne allergen.

## 2024-08-22 ENCOUNTER — Ambulatory Visit: Admitting: Family Medicine

## 2024-08-22 ENCOUNTER — Encounter: Payer: Self-pay | Admitting: Family Medicine

## 2024-08-22 VITALS — BP 114/74 | HR 112 | Temp 97.9°F | Ht 59.0 in | Wt 134.0 lb

## 2024-08-22 DIAGNOSIS — J302 Other seasonal allergic rhinitis: Secondary | ICD-10-CM

## 2024-08-22 DIAGNOSIS — H1010 Acute atopic conjunctivitis, unspecified eye: Secondary | ICD-10-CM | POA: Diagnosis not present

## 2024-08-22 DIAGNOSIS — Z720 Tobacco use: Secondary | ICD-10-CM | POA: Diagnosis not present

## 2024-08-22 DIAGNOSIS — J4489 Other specified chronic obstructive pulmonary disease: Secondary | ICD-10-CM | POA: Diagnosis not present

## 2024-08-22 DIAGNOSIS — K219 Gastro-esophageal reflux disease without esophagitis: Secondary | ICD-10-CM | POA: Diagnosis not present

## 2024-08-22 DIAGNOSIS — J3089 Other allergic rhinitis: Secondary | ICD-10-CM | POA: Diagnosis not present

## 2024-08-22 MED ORDER — AIRSUPRA 90-80 MCG/ACT IN AERO: 2.0000 | INHALATION_SPRAY | RESPIRATORY_TRACT | 5 refills | Status: AC | PRN

## 2024-08-22 MED ORDER — TRELEGY ELLIPTA 200-62.5-25 MCG/ACT IN AEPB: INHALATION_SPRAY | RESPIRATORY_TRACT | 5 refills | Status: AC

## 2024-08-22 MED ORDER — BUDESONIDE 0.5 MG/2ML IN SUSP: RESPIRATORY_TRACT | 1 refills | Status: AC

## 2024-08-22 MED ORDER — ALBUTEROL SULFATE (2.5 MG/3ML) 0.083% IN NEBU: 2.5000 mg | INHALATION_SOLUTION | RESPIRATORY_TRACT | 1 refills | Status: AC | PRN

## 2025-02-20 ENCOUNTER — Ambulatory Visit: Admitting: Family Medicine
